# Patient Record
Sex: Female | Born: 1950
Health system: Southern US, Community
[De-identification: ages and names within clinical notes are randomized; demographics above are authoritative.]

## PROBLEM LIST (undated history)

## (undated) DIAGNOSIS — I10 Essential (primary) hypertension: Secondary | ICD-10-CM

## (undated) DIAGNOSIS — K921 Melena: Secondary | ICD-10-CM

## (undated) DIAGNOSIS — F419 Anxiety disorder, unspecified: Secondary | ICD-10-CM

## (undated) DIAGNOSIS — E785 Hyperlipidemia, unspecified: Secondary | ICD-10-CM

## (undated) DIAGNOSIS — M1990 Unspecified osteoarthritis, unspecified site: Secondary | ICD-10-CM

## (undated) DIAGNOSIS — K635 Polyp of colon: Secondary | ICD-10-CM

## (undated) DIAGNOSIS — E079 Disorder of thyroid, unspecified: Secondary | ICD-10-CM

## (undated) HISTORY — DX: Unspecified osteoarthritis, unspecified site: M19.90

## (undated) HISTORY — DX: Disorder of thyroid, unspecified: E07.9

## (undated) HISTORY — DX: Hyperlipidemia, unspecified: E78.5

## (undated) HISTORY — DX: Essential (primary) hypertension: I10

## (undated) HISTORY — DX: Melena: K92.1

## (undated) HISTORY — DX: Polyp of colon: K63.5

## (undated) HISTORY — PX: ABDOMINAL HYSTERECTOMY: SHX81

---

## 1998-01-09 ENCOUNTER — Emergency Department (HOSPITAL_COMMUNITY): Admission: EM | Admit: 1998-01-09 | Discharge: 1998-01-09 | Payer: Self-pay | Admitting: Emergency Medicine

## 1998-01-19 ENCOUNTER — Emergency Department (HOSPITAL_COMMUNITY): Admission: EM | Admit: 1998-01-19 | Discharge: 1998-01-19 | Payer: Self-pay | Admitting: Emergency Medicine

## 1998-10-06 ENCOUNTER — Ambulatory Visit (HOSPITAL_COMMUNITY): Admission: RE | Admit: 1998-10-06 | Discharge: 1998-10-06 | Payer: Self-pay | Admitting: *Deleted

## 1998-10-06 ENCOUNTER — Encounter (INDEPENDENT_AMBULATORY_CARE_PROVIDER_SITE_OTHER): Payer: Self-pay

## 1999-03-16 ENCOUNTER — Encounter: Admission: RE | Admit: 1999-03-16 | Discharge: 1999-03-16 | Payer: Self-pay | Admitting: Family Medicine

## 1999-03-16 ENCOUNTER — Encounter: Payer: Self-pay | Admitting: Family Medicine

## 1999-08-19 ENCOUNTER — Encounter: Payer: Self-pay | Admitting: Family Medicine

## 1999-08-19 ENCOUNTER — Encounter: Admission: RE | Admit: 1999-08-19 | Discharge: 1999-08-19 | Payer: Self-pay | Admitting: Family Medicine

## 1999-12-26 ENCOUNTER — Encounter: Payer: Self-pay | Admitting: Family Medicine

## 1999-12-26 ENCOUNTER — Encounter: Admission: RE | Admit: 1999-12-26 | Discharge: 1999-12-26 | Payer: Self-pay | Admitting: Family Medicine

## 2000-04-06 ENCOUNTER — Encounter: Admission: RE | Admit: 2000-04-06 | Discharge: 2000-04-06 | Payer: Self-pay | Admitting: Family Medicine

## 2000-04-06 ENCOUNTER — Encounter: Payer: Self-pay | Admitting: Family Medicine

## 2000-04-20 ENCOUNTER — Encounter (INDEPENDENT_AMBULATORY_CARE_PROVIDER_SITE_OTHER): Payer: Self-pay | Admitting: Specialist

## 2000-04-20 ENCOUNTER — Other Ambulatory Visit: Admission: RE | Admit: 2000-04-20 | Discharge: 2000-04-20 | Payer: Self-pay | Admitting: Family Medicine

## 2000-04-20 ENCOUNTER — Encounter: Admission: RE | Admit: 2000-04-20 | Discharge: 2000-04-20 | Payer: Self-pay | Admitting: Family Medicine

## 2000-04-20 ENCOUNTER — Encounter: Payer: Self-pay | Admitting: Family Medicine

## 2000-04-23 ENCOUNTER — Other Ambulatory Visit: Admission: RE | Admit: 2000-04-23 | Discharge: 2000-04-23 | Payer: Self-pay | Admitting: Gynecology

## 2001-01-04 ENCOUNTER — Encounter: Payer: Self-pay | Admitting: Family Medicine

## 2001-01-04 ENCOUNTER — Encounter: Admission: RE | Admit: 2001-01-04 | Discharge: 2001-01-04 | Payer: Self-pay | Admitting: Family Medicine

## 2001-04-12 ENCOUNTER — Encounter: Payer: Self-pay | Admitting: Family Medicine

## 2001-04-12 ENCOUNTER — Encounter: Admission: RE | Admit: 2001-04-12 | Discharge: 2001-04-12 | Payer: Self-pay | Admitting: Family Medicine

## 2001-04-30 ENCOUNTER — Other Ambulatory Visit: Admission: RE | Admit: 2001-04-30 | Discharge: 2001-04-30 | Payer: Self-pay | Admitting: Gynecology

## 2001-08-30 ENCOUNTER — Encounter: Payer: Self-pay | Admitting: Gastroenterology

## 2001-08-30 ENCOUNTER — Ambulatory Visit (HOSPITAL_COMMUNITY): Admission: RE | Admit: 2001-08-30 | Discharge: 2001-08-30 | Payer: Self-pay | Admitting: Gastroenterology

## 2001-09-11 ENCOUNTER — Encounter: Admission: RE | Admit: 2001-09-11 | Discharge: 2001-09-11 | Payer: Self-pay | Admitting: Family Medicine

## 2001-09-11 ENCOUNTER — Encounter: Payer: Self-pay | Admitting: Family Medicine

## 2001-10-09 ENCOUNTER — Ambulatory Visit (HOSPITAL_COMMUNITY): Admission: RE | Admit: 2001-10-09 | Discharge: 2001-10-09 | Payer: Self-pay | Admitting: Gastroenterology

## 2002-04-24 ENCOUNTER — Encounter: Payer: Self-pay | Admitting: Family Medicine

## 2002-04-24 ENCOUNTER — Encounter: Admission: RE | Admit: 2002-04-24 | Discharge: 2002-04-24 | Payer: Self-pay | Admitting: Family Medicine

## 2002-05-02 ENCOUNTER — Encounter: Admission: RE | Admit: 2002-05-02 | Discharge: 2002-05-02 | Payer: Self-pay | Admitting: Family Medicine

## 2002-05-02 ENCOUNTER — Encounter: Payer: Self-pay | Admitting: Family Medicine

## 2002-05-12 ENCOUNTER — Other Ambulatory Visit: Admission: RE | Admit: 2002-05-12 | Discharge: 2002-05-12 | Payer: Self-pay | Admitting: Gynecology

## 2002-10-28 ENCOUNTER — Encounter: Payer: Self-pay | Admitting: Family Medicine

## 2002-10-28 ENCOUNTER — Encounter: Admission: RE | Admit: 2002-10-28 | Discharge: 2002-10-28 | Payer: Self-pay | Admitting: Family Medicine

## 2003-04-10 ENCOUNTER — Encounter: Admission: RE | Admit: 2003-04-10 | Discharge: 2003-04-10 | Payer: Self-pay | Admitting: Family Medicine

## 2003-12-29 ENCOUNTER — Encounter: Admission: RE | Admit: 2003-12-29 | Discharge: 2003-12-29 | Payer: Self-pay | Admitting: *Deleted

## 2004-03-03 ENCOUNTER — Other Ambulatory Visit: Admission: RE | Admit: 2004-03-03 | Discharge: 2004-03-03 | Payer: Self-pay | Admitting: Family Medicine

## 2004-04-13 ENCOUNTER — Encounter: Admission: RE | Admit: 2004-04-13 | Discharge: 2004-04-13 | Payer: Self-pay | Admitting: Family Medicine

## 2005-03-10 ENCOUNTER — Encounter: Admission: RE | Admit: 2005-03-10 | Discharge: 2005-03-10 | Payer: Self-pay | Admitting: Family Medicine

## 2005-04-28 ENCOUNTER — Encounter: Admission: RE | Admit: 2005-04-28 | Discharge: 2005-04-28 | Payer: Self-pay | Admitting: Family Medicine

## 2005-05-11 ENCOUNTER — Encounter: Admission: RE | Admit: 2005-05-11 | Discharge: 2005-05-11 | Payer: Self-pay | Admitting: Family Medicine

## 2005-11-17 ENCOUNTER — Encounter: Admission: RE | Admit: 2005-11-17 | Discharge: 2005-11-17 | Payer: Self-pay | Admitting: Family Medicine

## 2006-05-21 ENCOUNTER — Encounter: Admission: RE | Admit: 2006-05-21 | Discharge: 2006-05-21 | Payer: Self-pay | Admitting: Family Medicine

## 2006-11-21 ENCOUNTER — Encounter: Admission: RE | Admit: 2006-11-21 | Discharge: 2006-11-21 | Payer: Self-pay | Admitting: Family Medicine

## 2007-05-22 ENCOUNTER — Encounter: Admission: RE | Admit: 2007-05-22 | Discharge: 2007-05-22 | Payer: Self-pay | Admitting: Family Medicine

## 2008-05-22 ENCOUNTER — Encounter: Admission: RE | Admit: 2008-05-22 | Discharge: 2008-05-22 | Payer: Self-pay | Admitting: Family Medicine

## 2009-06-02 ENCOUNTER — Encounter: Admission: RE | Admit: 2009-06-02 | Discharge: 2009-06-02 | Payer: Self-pay | Admitting: Family Medicine

## 2010-04-03 ENCOUNTER — Encounter: Payer: Self-pay | Admitting: Family Medicine

## 2010-05-12 ENCOUNTER — Other Ambulatory Visit: Payer: Self-pay | Admitting: Family Medicine

## 2010-05-12 DIAGNOSIS — Z1231 Encounter for screening mammogram for malignant neoplasm of breast: Secondary | ICD-10-CM

## 2010-06-08 ENCOUNTER — Ambulatory Visit
Admission: RE | Admit: 2010-06-08 | Discharge: 2010-06-08 | Disposition: A | Discharge status: Home / Self Care | Payer: BC Managed Care – PPO | Source: Ambulatory Visit | Attending: Family Medicine | Admitting: Family Medicine

## 2010-06-08 DIAGNOSIS — Z1231 Encounter for screening mammogram for malignant neoplasm of breast: Secondary | ICD-10-CM

## 2010-06-10 ENCOUNTER — Other Ambulatory Visit: Payer: Self-pay | Admitting: Family Medicine

## 2010-06-10 DIAGNOSIS — R928 Other abnormal and inconclusive findings on diagnostic imaging of breast: Secondary | ICD-10-CM

## 2010-06-15 ENCOUNTER — Ambulatory Visit
Admission: RE | Admit: 2010-06-15 | Discharge: 2010-06-15 | Disposition: A | Discharge status: Home / Self Care | Payer: BC Managed Care – PPO | Source: Ambulatory Visit | Attending: Family Medicine | Admitting: Family Medicine

## 2010-06-15 DIAGNOSIS — R928 Other abnormal and inconclusive findings on diagnostic imaging of breast: Secondary | ICD-10-CM

## 2010-07-05 DIAGNOSIS — F339 Major depressive disorder, recurrent, unspecified: Secondary | ICD-10-CM | POA: Insufficient documentation

## 2010-07-05 DIAGNOSIS — G4700 Insomnia, unspecified: Secondary | ICD-10-CM | POA: Diagnosis present

## 2010-07-05 DIAGNOSIS — M1990 Unspecified osteoarthritis, unspecified site: Secondary | ICD-10-CM | POA: Insufficient documentation

## 2010-07-05 DIAGNOSIS — A6000 Herpesviral infection of urogenital system, unspecified: Secondary | ICD-10-CM | POA: Insufficient documentation

## 2010-07-05 DIAGNOSIS — I10 Essential (primary) hypertension: Secondary | ICD-10-CM | POA: Insufficient documentation

## 2010-07-05 DIAGNOSIS — E785 Hyperlipidemia, unspecified: Secondary | ICD-10-CM | POA: Insufficient documentation

## 2010-07-05 DIAGNOSIS — R7301 Impaired fasting glucose: Secondary | ICD-10-CM | POA: Insufficient documentation

## 2010-07-05 DIAGNOSIS — D1809 Hemangioma of other sites: Secondary | ICD-10-CM | POA: Insufficient documentation

## 2010-07-05 DIAGNOSIS — E039 Hypothyroidism, unspecified: Secondary | ICD-10-CM | POA: Insufficient documentation

## 2010-07-05 DIAGNOSIS — H269 Unspecified cataract: Secondary | ICD-10-CM | POA: Insufficient documentation

## 2010-07-05 DIAGNOSIS — H02409 Unspecified ptosis of unspecified eyelid: Secondary | ICD-10-CM | POA: Insufficient documentation

## 2010-07-05 DIAGNOSIS — F411 Generalized anxiety disorder: Secondary | ICD-10-CM | POA: Diagnosis present

## 2010-07-29 NOTE — Op Note (Signed)
   NAME:  [name], [name]                            ACCOUNT NO.:  [id number]   MEDICAL RECORD NO.:  [id number]                   PATIENT TYPE:  AMB   LOCATION:  ENDO                                 FACILITY:  [facility]   PHYSICIAN:  Charna Elizabeth, M.D.                   DATE OF BIRTH:  1951/01/07   DATE OF PROCEDURE:  10/09/2001  DATE OF DISCHARGE:  10/09/2001                                 OPERATIVE REPORT   PROCEDURE PERFORMED:  Screening colonoscopy endoscopy.   ENDOSCOPIST:  Charna Elizabeth, M.D.   INSTRUMENT USED:  Olympus video colonoscope.   INDICATIONS FOR PROCEDURE:  This 60 year old female undergoing screening  colonoscopy.  The patient has had some rectal bleeding.  Rule out colonic  polyps, masses, hemorrhoids, and fissure.   PREPROCEDURE PREPARATION:  Informed consent was procured from the patient.  The patient fasted for eight hours prior to the procedure and prepped with a  bottle of magnesium citrate, one gallon of NuLytely the night prior to the  procedure.   PREPROCEDURE PHYSICAL:  VITAL SIGNS:  The patient has stable vital signs.   NECK:  Supple.   CHEST:  Clear to auscultation.   CARDIAC:  S1, S2 is regular.   ABDOMEN:  Soft with normal bowel sounds.   DESCRIPTION OF PROCEDURE:  The patient was placed in the left lateral  decubitus position and sedated with 77 mg of Demerol and 7 mg of Versed  intravenously.  Once the patient was adequately sedated and maintained on  low-flow oxygen and continuous cardiac monitoring, the Olympus video  colonoscope was advanced from the rectum to the cecum without difficulty.  The terminal ileum was visualized and appeared normal as well.  The  appendiceal orifice and ileocecal valve were photographed.  No masses,  polyps, erosions, ulcerations, diverticula were present.  Small nonbleeding  internal hemorrhoids were appreciated on retroflexion of the rectum.   IMPRESSION:  Normal colonoscopy up to the terminal ileum except for  small,  nonbleeding internal hemorrhoids.   RECOMMENDATIONS:  1. A high-fiber diet has been discussed with the patient in great detail and     liberal fluid intake has been advocated.  2.     Repeat colorectal cancer screening is recommended in the next 10 years     unless the patient develops any abnormal symptoms of in the interim.  3. Outpatient follow up on a p.r.n. basis.                                                   Charna Elizabeth, M.D.    JM/MEDQ  D:  10/09/2001  T:  10/12/2001  Job:  [id number]   cc:   Talmadge Coventry, M.D.

## 2010-08-24 ENCOUNTER — Other Ambulatory Visit: Payer: Self-pay | Admitting: Family Medicine

## 2010-08-24 DIAGNOSIS — L309 Dermatitis, unspecified: Secondary | ICD-10-CM | POA: Insufficient documentation

## 2010-08-24 DIAGNOSIS — J309 Allergic rhinitis, unspecified: Secondary | ICD-10-CM | POA: Insufficient documentation

## 2010-08-24 DIAGNOSIS — Z780 Asymptomatic menopausal state: Secondary | ICD-10-CM

## 2010-08-31 ENCOUNTER — Ambulatory Visit
Admission: RE | Admit: 2010-08-31 | Discharge: 2010-08-31 | Disposition: A | Discharge status: Home / Self Care | Payer: BC Managed Care – PPO | Source: Ambulatory Visit | Attending: Family Medicine | Admitting: Family Medicine

## 2010-08-31 DIAGNOSIS — Z780 Asymptomatic menopausal state: Secondary | ICD-10-CM

## 2011-05-23 ENCOUNTER — Other Ambulatory Visit: Payer: Self-pay | Admitting: Family Medicine

## 2011-05-23 DIAGNOSIS — Z1231 Encounter for screening mammogram for malignant neoplasm of breast: Secondary | ICD-10-CM

## 2011-06-16 ENCOUNTER — Ambulatory Visit
Admission: RE | Admit: 2011-06-16 | Discharge: 2011-06-16 | Disposition: A | Discharge status: Home / Self Care | Payer: BC Managed Care – PPO | Source: Ambulatory Visit | Attending: Family Medicine | Admitting: Family Medicine

## 2011-06-16 DIAGNOSIS — Z1231 Encounter for screening mammogram for malignant neoplasm of breast: Secondary | ICD-10-CM

## 2012-05-27 ENCOUNTER — Other Ambulatory Visit: Payer: Self-pay

## 2012-05-27 DIAGNOSIS — Z1231 Encounter for screening mammogram for malignant neoplasm of breast: Secondary | ICD-10-CM

## 2012-07-02 ENCOUNTER — Ambulatory Visit
Admission: RE | Admit: 2012-07-02 | Discharge: 2012-07-02 | Disposition: A | Discharge status: Home / Self Care | Source: Ambulatory Visit

## 2012-07-02 DIAGNOSIS — Z1231 Encounter for screening mammogram for malignant neoplasm of breast: Secondary | ICD-10-CM

## 2013-06-20 ENCOUNTER — Other Ambulatory Visit: Payer: Self-pay

## 2013-06-20 DIAGNOSIS — Z1231 Encounter for screening mammogram for malignant neoplasm of breast: Secondary | ICD-10-CM

## 2013-07-09 ENCOUNTER — Encounter (INDEPENDENT_AMBULATORY_CARE_PROVIDER_SITE_OTHER): Payer: Self-pay

## 2013-07-09 ENCOUNTER — Ambulatory Visit
Admission: RE | Admit: 2013-07-09 | Discharge: 2013-07-09 | Disposition: A | Discharge status: Home / Self Care | Payer: BC Managed Care – PPO | Source: Ambulatory Visit

## 2013-07-09 DIAGNOSIS — Z1231 Encounter for screening mammogram for malignant neoplasm of breast: Secondary | ICD-10-CM

## 2014-06-25 ENCOUNTER — Other Ambulatory Visit: Payer: Self-pay

## 2014-06-25 DIAGNOSIS — Z1231 Encounter for screening mammogram for malignant neoplasm of breast: Secondary | ICD-10-CM

## 2014-07-16 ENCOUNTER — Ambulatory Visit: Payer: BC Managed Care – PPO

## 2014-07-27 ENCOUNTER — Ambulatory Visit
Admission: RE | Admit: 2014-07-27 | Discharge: 2014-07-27 | Disposition: A | Discharge status: Home / Self Care | Payer: BC Managed Care – PPO | Source: Ambulatory Visit

## 2014-07-27 DIAGNOSIS — Z1231 Encounter for screening mammogram for malignant neoplasm of breast: Secondary | ICD-10-CM

## 2014-10-30 ENCOUNTER — Other Ambulatory Visit (INDEPENDENT_AMBULATORY_CARE_PROVIDER_SITE_OTHER): Payer: BC Managed Care – PPO

## 2014-10-30 ENCOUNTER — Encounter: Payer: Self-pay | Admitting: Internal Medicine

## 2014-10-30 ENCOUNTER — Ambulatory Visit (INDEPENDENT_AMBULATORY_CARE_PROVIDER_SITE_OTHER): Payer: BC Managed Care – PPO | Admitting: Internal Medicine

## 2014-10-30 VITALS — BP 110/60 | HR 70 | Temp 98.4°F | Resp 16 | Ht 67.0 in | Wt 181.0 lb

## 2014-10-30 DIAGNOSIS — H60392 Other infective otitis externa, left ear: Secondary | ICD-10-CM

## 2014-10-30 DIAGNOSIS — E039 Hypothyroidism, unspecified: Secondary | ICD-10-CM

## 2014-10-30 DIAGNOSIS — Z0000 Encounter for general adult medical examination without abnormal findings: Secondary | ICD-10-CM | POA: Diagnosis not present

## 2014-10-30 DIAGNOSIS — I10 Essential (primary) hypertension: Secondary | ICD-10-CM

## 2014-10-30 DIAGNOSIS — D1720 Benign lipomatous neoplasm of skin and subcutaneous tissue of unspecified limb: Secondary | ICD-10-CM

## 2014-10-30 DIAGNOSIS — F3340 Major depressive disorder, recurrent, in remission, unspecified: Secondary | ICD-10-CM

## 2014-10-30 DIAGNOSIS — D179 Benign lipomatous neoplasm, unspecified: Secondary | ICD-10-CM

## 2014-10-30 DIAGNOSIS — E785 Hyperlipidemia, unspecified: Secondary | ICD-10-CM

## 2014-10-30 LAB — COMPREHENSIVE METABOLIC PANEL
ALT: 9 U/L (ref 0–35)
AST: 15 U/L (ref 0–37)
Albumin: 4 g/dL (ref 3.5–5.2)
Alkaline Phosphatase: 63 U/L (ref 39–117)
BUN: 19 mg/dL (ref 6–23)
CO2: 28 mEq/L (ref 19–32)
Calcium: 9.3 mg/dL (ref 8.4–10.5)
Chloride: 106 mEq/L (ref 96–112)
Creatinine, Ser: 0.74 mg/dL (ref 0.40–1.20)
GFR: 101.43 mL/min (ref >60.00)
Glucose, Bld: 99 mg/dL (ref 70–99)
Potassium: 4.1 mEq/L (ref 3.5–5.1)
Sodium: 140 mEq/L (ref 135–145)
Total Bilirubin: 0.4 mg/dL (ref 0.2–1.2)
Total Protein: 7.2 g/dL (ref 6.0–8.3)

## 2014-10-30 LAB — HEMOGLOBIN A1C: Hgb A1c MFr Bld: 5.6 % (ref 4.6–6.5)

## 2014-10-30 LAB — LIPID PANEL
Cholesterol: 175 mg/dL (ref 0–200)
HDL: 59.8 mg/dL (ref >39.00)
LDL Cholesterol: 108 mg/dL — ABNORMAL HIGH (ref 0–99)
NonHDL: 114.91
Total CHOL/HDL Ratio: 3
Triglycerides: 36 mg/dL (ref 0.0–149.0)
VLDL: 7.2 mg/dL (ref 0.0–40.0)

## 2014-10-30 LAB — TSH: TSH: 1.08 u[IU]/mL (ref 0.35–4.50)

## 2014-10-30 LAB — CBC
HCT: 40 % (ref 36.0–46.0)
Hemoglobin: 13.9 g/dL (ref 12.0–15.0)
MCHC: 34.6 g/dL (ref 30.0–36.0)
MCV: 88.3 fl (ref 78.0–100.0)
Platelets: 227 10*3/uL (ref 150.0–400.0)
RBC: 4.53 Mil/uL (ref 3.87–5.11)
RDW: 14.1 % (ref 11.5–15.5)
WBC: 4.9 10*3/uL (ref 4.0–10.5)

## 2014-10-30 LAB — T4, FREE: Free T4: 0.96 ng/dL (ref 0.60–1.60)

## 2014-10-30 MED ORDER — NEOMYCIN-POLYMYXIN-HC 1 % OT SOLN: 3.0000 [drp] | Freq: Three times a day (TID) | OTIC | Status: DC

## 2014-10-30 NOTE — Progress Notes (Signed)
Pre visit review using our clinic review tool, if applicable. No additional management support is needed unless otherwise documented below in the visit note. 

## 2014-10-30 NOTE — Patient Instructions (Signed)
We will send you to the surgeon to have that spot on your arm off. It is called a lipoma and is not cancerous.  We are also checking your labs today for the physical. Come back in about 6 months. If you have any problems or questions please feel free to call us sooner.  It was a pleasure to meet you today.   Health Maintenance Adopting a healthy lifestyle and getting preventive care can go a long way to promote health and wellness. Talk with your health care provider about what schedule of regular examinations is right for you. This is a good chance for you to check in with your provider about disease prevention and staying healthy. In between checkups, there are plenty of things you can do on your own. Experts have done a lot of research about which lifestyle changes and preventive measures are most likely to keep you healthy. Ask your health care provider for more information. WEIGHT AND DIET  Eat a healthy diet  Be sure to include plenty of vegetables, fruits, low-fat dairy products, and lean protein.  Do not eat a lot of foods high in solid fats, added sugars, or salt.  Get regular exercise. This is one of the most important things you can do for your health.  Most adults should exercise for at least 150 minutes each week. The exercise should increase your heart rate and make you sweat (moderate-intensity exercise).  Most adults should also do strengthening exercises at least twice a week. This is in addition to the moderate-intensity exercise.  Maintain a healthy weight  Body mass index (BMI) is a measurement that can be used to identify possible weight problems. It estimates body fat based on height and weight. Your health care provider can help determine your BMI and help you achieve or maintain a healthy weight.  For females 20 years of age and older:   A BMI below 18.5 is considered underweight.  A BMI of 18.5 to 24.9 is normal.  A BMI of 25 to 29.9 is considered  overweight.  A BMI of 30 and above is considered obese.  Watch levels of cholesterol and blood lipids  You should start having your blood tested for lipids and cholesterol at 64 years of age, then have this test every 5 years.  You may need to have your cholesterol levels checked more often if:  Your lipid or cholesterol levels are high.  You are older than 64 years of age.  You are at high risk for heart disease.  CANCER SCREENING   Lung Cancer  Lung cancer screening is recommended for adults 55-80 years old who are at high risk for lung cancer because of a history of smoking.  A yearly low-dose CT scan of the lungs is recommended for people who:  Currently smoke.  Have quit within the past 15 years.  Have at least a 30-pack-year history of smoking. A pack year is smoking an average of one pack of cigarettes a day for 1 year.  Yearly screening should continue until it has been 15 years since you quit.  Yearly screening should stop if you develop a health problem that would prevent you from having lung cancer treatment.  Breast Cancer  Practice breast self-awareness. This means understanding how your breasts normally appear and feel.  It also means doing regular breast self-exams. Let your health care provider know about any changes, no matter how small.  If you are in your 20s or 30s, you   should have a clinical breast exam (CBE) by a health care provider every 1-3 years as part of a regular health exam.  If you are 1 or older, have a CBE every year. Also consider having a breast X-ray (mammogram) every year.  If you have a family history of breast cancer, talk to your health care provider about genetic screening.  If you are at high risk for breast cancer, talk to your health care provider about having an MRI and a mammogram every year.  Breast cancer gene (BRCA) assessment is recommended for women who have family members with BRCA-related cancers. BRCA-related  cancers include:  Breast.  Ovarian.  Tubal.  Peritoneal cancers.  Results of the assessment will determine the need for genetic counseling and BRCA1 and BRCA2 testing. Cervical Cancer Routine pelvic examinations to screen for cervical cancer are no longer recommended for nonpregnant women who are considered low risk for cancer of the pelvic organs (ovaries, uterus, and vagina) and who do not have symptoms. A pelvic examination may be necessary if you have symptoms including those associated with pelvic infections. Ask your health care provider if a screening pelvic exam is right for you.   The Pap test is the screening test for cervical cancer for women who are considered at risk.  If you had a hysterectomy for a problem that was not cancer or a condition that could lead to cancer, then you no longer need Pap tests.  If you are older than 65 years, and you have had normal Pap tests for the past 10 years, you no longer need to have Pap tests.  If you have had past treatment for cervical cancer or a condition that could lead to cancer, you need Pap tests and screening for cancer for at least 20 years after your treatment.  If you no longer get a Pap test, assess your risk factors if they change (such as having a new sexual partner). This can affect whether you should start being screened again.  Some women have medical problems that increase their chance of getting cervical cancer. If this is the case for you, your health care provider may recommend more frequent screening and Pap tests.  The human papillomavirus (HPV) test is another test that may be used for cervical cancer screening. The HPV test looks for the virus that can cause cell changes in the cervix. The cells collected during the Pap test can be tested for HPV.  The HPV test can be used to screen women 78 years of age and older. Getting tested for HPV can extend the interval between normal Pap tests from three to five  years.  An HPV test also should be used to screen women of any age who have unclear Pap test results.  After 64 years of age, women should have HPV testing as often as Pap tests.  Colorectal Cancer  This type of cancer can be detected and often prevented.  Routine colorectal cancer screening usually begins at 64 years of age and continues through 64 years of age.  Your health care provider may recommend screening at an earlier age if you have risk factors for colon cancer.  Your health care provider may also recommend using home test kits to check for hidden blood in the stool.  A small camera at the end of a tube can be used to examine your colon directly (sigmoidoscopy or colonoscopy). This is done to check for the earliest forms of colorectal cancer.  Routine screening  usually begins at age 55.  Direct examination of the colon should be repeated every 5-10 years through 64 years of age. However, you may need to be screened more often if early forms of precancerous polyps or small growths are found. Skin Cancer  Check your skin from head to toe regularly.  Tell your health care provider about any new moles or changes in moles, especially if there is a change in a mole's shape or color.  Also tell your health care provider if you have a mole that is larger than the size of a pencil eraser.  Always use sunscreen. Apply sunscreen liberally and repeatedly throughout the day.  Protect yourself by wearing long sleeves, pants, a wide-brimmed hat, and sunglasses whenever you are outside. HEART DISEASE, DIABETES, AND HIGH BLOOD PRESSURE   Have your blood pressure checked at least every 1-2 years. High blood pressure causes heart disease and increases the risk of stroke.  If you are between 66 years and 12 years old, ask your health care provider if you should take aspirin to prevent strokes.  Have regular diabetes screenings. This involves taking a blood sample to check your fasting  blood sugar level.  If you are at a normal weight and have a low risk for diabetes, have this test once every three years after 65 years of age.  If you are overweight and have a high risk for diabetes, consider being tested at a younger age or more often. PREVENTING INFECTION  Hepatitis B  If you have a higher risk for hepatitis B, you should be screened for this virus. You are considered at high risk for hepatitis B if:  You were born in a country where hepatitis B is common. Ask your health care provider which countries are considered high risk.  Your parents were born in a high-risk country, and you have not been immunized against hepatitis B (hepatitis B vaccine).  You have HIV or AIDS.  You use needles to inject street drugs.  You live with someone who has hepatitis B.  You have had sex with someone who has hepatitis B.  You get hemodialysis treatment.  You take certain medicines for conditions, including cancer, organ transplantation, and autoimmune conditions. Hepatitis C  Blood testing is recommended for:  Everyone born from [birth year] through 1965.  Anyone with known risk factors for hepatitis C. Sexually transmitted infections (STIs)  You should be screened for sexually transmitted infections (STIs) including gonorrhea and chlamydia if:  You are sexually active and are younger than 64 years of age.  You are older than 64 years of age and your health care provider tells you that you are at risk for this type of infection.  Your sexual activity has changed since you were last screened and you are at an increased risk for chlamydia or gonorrhea. Ask your health care provider if you are at risk.  If you do not have HIV, but are at risk, it may be recommended that you take a prescription medicine daily to prevent HIV infection. This is called pre-exposure prophylaxis (PrEP). You are considered at risk if:  You are sexually active and do not regularly use condoms or know  the HIV status of your partner(s).  You take drugs by injection.  You are sexually active with a partner who has HIV. Talk with your health care provider about whether you are at high risk of being infected with HIV. If you choose to begin PrEP, you should first be tested  for HIV. You should then be tested every 3 months for as long as you are taking PrEP.  PREGNANCY   If you are premenopausal and you may become pregnant, ask your health care provider about preconception counseling.  If you may become pregnant, take 400 to 800 micrograms (mcg) of folic acid every day.  If you want to prevent pregnancy, talk to your health care provider about birth control (contraception). OSTEOPOROSIS AND MENOPAUSE   Osteoporosis is a disease in which the bones lose minerals and strength with aging. This can result in serious bone fractures. Your risk for osteoporosis can be identified using a bone density scan.  If you are 65 years of age or older, or if you are at risk for osteoporosis and fractures, ask your health care provider if you should be screened.  Ask your health care provider whether you should take a calcium or vitamin D supplement to lower your risk for osteoporosis.  Menopause may have certain physical symptoms and risks.  Hormone replacement therapy may reduce some of these symptoms and risks. Talk to your health care provider about whether hormone replacement therapy is right for you.  HOME CARE INSTRUCTIONS   Schedule regular health, dental, and eye exams.  Stay current with your immunizations.   Do not use any tobacco products including cigarettes, chewing tobacco, or electronic cigarettes.  If you are pregnant, do not drink alcohol.  If you are breastfeeding, limit how much and how often you drink alcohol.  Limit alcohol intake to no more than 1 drink per day for nonpregnant women. One drink equals 12 ounces of beer, 5 ounces of wine, or 1 ounces of hard liquor.  Do not  use street drugs.  Do not share needles.  Ask your health care provider for help if you need support or information about quitting drugs.  Tell your health care provider if you often feel depressed.  Tell your health care provider if you have ever been abused or do not feel safe at home. Document Released: 09/12/2010 Document Revised: 07/14/2013 Document Reviewed: 01/29/2013 Exit[employer] Patient Information 2015 [employer]. This information is not intended to replace advice given to you by your health care provider. Make sure you discuss any questions you have with your health care provider.  

## 2014-10-31 ENCOUNTER — Encounter: Payer: Self-pay | Admitting: Internal Medicine

## 2014-10-31 DIAGNOSIS — H60399 Other infective otitis externa, unspecified ear: Secondary | ICD-10-CM | POA: Insufficient documentation

## 2014-10-31 DIAGNOSIS — D1720 Benign lipomatous neoplasm of skin and subcutaneous tissue of unspecified limb: Secondary | ICD-10-CM | POA: Insufficient documentation

## 2014-10-31 DIAGNOSIS — E785 Hyperlipidemia, unspecified: Secondary | ICD-10-CM | POA: Insufficient documentation

## 2014-10-31 DIAGNOSIS — E039 Hypothyroidism, unspecified: Secondary | ICD-10-CM | POA: Insufficient documentation

## 2014-10-31 DIAGNOSIS — I10 Essential (primary) hypertension: Secondary | ICD-10-CM | POA: Insufficient documentation

## 2014-10-31 DIAGNOSIS — F3340 Major depressive disorder, recurrent, in remission, unspecified: Secondary | ICD-10-CM | POA: Insufficient documentation

## 2014-10-31 NOTE — Assessment & Plan Note (Signed)
Checking BMP and adjust therapy as needed. BP controlled on her current losartan/hctz 50/12.5 mg and room to increase in the future if needed.

## 2014-10-31 NOTE — Assessment & Plan Note (Signed)
Acute problem, rx for corticosporin ear drops for resolution.

## 2014-10-31 NOTE — Assessment & Plan Note (Signed)
Currently taking synthroid 100 mcg daily, checking TSH and free T4 and adjust dosing as appropriate. No symptoms of over or under replacement.

## 2014-10-31 NOTE — Assessment & Plan Note (Signed)
Had episode of severe symptoms and doing well with cymbalta and trazodone at this time. Does not want to try going off therapy as she has failed going off this in the past.

## 2014-10-31 NOTE — Progress Notes (Signed)
   Subjective:    Patient ID: [name], female    DOB: 09/19/1950, 64 y.o.   MRN: [id number]  HPI The patient is a new 64 YO female who is coming in for several concerns. She has a growth on her left arm that is growing and she wants looked at. It does not hurt and has been present for many years. Now in the last year it is growing and starting to get attention and she does not like that. No weight change.  The other concern is that her left ear is hurting. She does some swimming and thinks that may have caused it. Hurts to move her ear. No fevers or chills, no discharge from her ears and no hearing change. Gong on about 1-2 weeks. Worsening.  PMH, [facility], social history reviewed and updated.  Review of Systems  Constitutional: Negative for fever, activity change, appetite change, fatigue and unexpected weight change.  HENT: Positive for ear pain. Negative for congestion, ear discharge, hearing loss, rhinorrhea, sinus pressure and sore throat.   Eyes: Negative.   Respiratory: Negative for cough, chest tightness, shortness of breath and wheezing.   Cardiovascular: Negative for chest pain, palpitations and leg swelling.  Gastrointestinal: Negative for nausea, abdominal pain, diarrhea, constipation and abdominal distention.  Musculoskeletal: Negative.   Skin: Negative for color change and wound.       Area of growth on left arm  Neurological: Negative.   Psychiatric/Behavioral: Negative.       Objective:   Physical Exam  Constitutional: She is oriented to person, place, and time. She appears well-developed and well-nourished.  Overweight  HENT:  Head: Normocephalic and atraumatic.  Left ear with otitis externa, TM normal bilaterally  Eyes: EOM are normal.  Neck: Normal range of motion.  Cardiovascular: Normal rate and regular rhythm.   No murmur heard. Pulmonary/Chest: Effort normal and breath sounds normal. No respiratory distress. She has no wheezes. She has no rales.  Abdominal:  Soft. Bowel sounds are normal. She exhibits no distension. There is no tenderness. There is no rebound.  Musculoskeletal: She exhibits no tenderness.  Neurological: She is alert and oriented to person, place, and time. Coordination normal.  Skin: Skin is warm and dry.  6 cm lipoma on the triceps region left arm posterior.  Psychiatric: She has a normal mood and affect.   Filed Vitals:   10/30/14 1105  BP: 110/60  Pulse: 70  Temp: 98.4 F (36.9 C)  TempSrc: Oral  Resp: 16  Height: 5\' 7"  (1.702 m)  Weight: 181 lb (82.101 kg)  SpO2: 97%      Assessment & Plan:

## 2014-10-31 NOTE — Assessment & Plan Note (Signed)
Checking lipid panel to evaluate. Currently taking simvastatin 20 mg daily without side effects. Checking LFTs as well.

## 2014-10-31 NOTE — Assessment & Plan Note (Signed)
Referral to general surgery for resection due to cosmetic reason (large and visible to public). Reassured her that this is not concerning for malignancy.

## 2014-11-09 ENCOUNTER — Telehealth: Payer: Self-pay | Admitting: Internal Medicine

## 2014-11-09 NOTE — Telephone Encounter (Signed)
Patient is calling for the result of her labs °

## 2014-11-10 NOTE — Telephone Encounter (Signed)
Left patient a message informing her that all her labs were normal.

## 2014-11-17 ENCOUNTER — Telehealth: Payer: Self-pay | Admitting: Internal Medicine

## 2014-11-17 NOTE — Telephone Encounter (Signed)
Received records from [facility] forwarded 22 pages to Dr. Vertell Novak 11/17/14 fbg.

## 2014-11-18 ENCOUNTER — Ambulatory Visit: Payer: Self-pay | Admitting: Surgery

## 2014-12-15 ENCOUNTER — Other Ambulatory Visit: Payer: Self-pay | Admitting: Geriatric Medicine

## 2014-12-15 ENCOUNTER — Telehealth: Payer: Self-pay | Admitting: Internal Medicine

## 2014-12-15 MED ORDER — SIMVASTATIN 20 MG PO TABS: 20.0000 mg | ORAL_TABLET | Freq: Every day | ORAL | Status: DC

## 2014-12-15 MED ORDER — TRAZODONE HCL 50 MG PO TABS: ORAL_TABLET | ORAL | Status: DC

## 2014-12-15 MED ORDER — LOSARTAN POTASSIUM-HCTZ 50-12.5 MG PO TABS: 1.0000 | ORAL_TABLET | Freq: Every day | ORAL | Status: DC

## 2014-12-15 MED ORDER — DULOXETINE HCL 60 MG PO CPEP: 60.0000 mg | ORAL_CAPSULE | Freq: Every day | ORAL | Status: DC

## 2014-12-15 NOTE — Telephone Encounter (Signed)
Pt called requesting refills for  DULoxetine (CYMBALTA) 60 MG capsule [[id number]]  losartan-hydrochlorothiazide (HYZAAR) 50-12.5 MG per tablet [[id number]]  simvastatin (ZOCOR) 20 MG tablet [[id number] traZODone (DESYREL) 50 MG tablet [[id number]  Pharmacy is [facility] on [address]

## 2014-12-15 NOTE — Telephone Encounter (Signed)
Sent to pharmacy 

## 2015-01-01 ENCOUNTER — Ambulatory Visit: Payer: Self-pay | Admitting: Surgery

## 2015-01-12 ENCOUNTER — Other Ambulatory Visit: Payer: Self-pay | Admitting: Internal Medicine

## 2015-03-17 ENCOUNTER — Telehealth: Payer: Self-pay | Admitting: *Deleted

## 2015-03-17 MED ORDER — SIMVASTATIN 20 MG PO TABS: 20.0000 mg | ORAL_TABLET | Freq: Every day | ORAL | Status: DC

## 2015-03-17 MED ORDER — DULOXETINE HCL 60 MG PO CPEP: 60.0000 mg | ORAL_CAPSULE | Freq: Every day | ORAL | Status: DC

## 2015-03-17 MED ORDER — TRAZODONE HCL 50 MG PO TABS: ORAL_TABLET | ORAL | Status: DC

## 2015-03-17 MED ORDER — LOSARTAN POTASSIUM-HCTZ 50-12.5 MG PO TABS: 1.0000 | ORAL_TABLET | Freq: Every day | ORAL | Status: DC

## 2015-03-17 NOTE — Telephone Encounter (Signed)
Received call pt states she is needing refills on her Cymbalta, Losartan,simvastatin and Trazodone. Needing rx's to go to [facility] instead of [facility] due to insurance change. Verified which [facility] inform sending electronically...Johny Chess

## 2015-05-03 ENCOUNTER — Ambulatory Visit (INDEPENDENT_AMBULATORY_CARE_PROVIDER_SITE_OTHER): Payer: Medicare Other | Admitting: Internal Medicine

## 2015-05-03 ENCOUNTER — Encounter: Payer: Self-pay | Admitting: Internal Medicine

## 2015-05-03 ENCOUNTER — Other Ambulatory Visit (INDEPENDENT_AMBULATORY_CARE_PROVIDER_SITE_OTHER): Payer: Medicare Other

## 2015-05-03 VITALS — BP 122/78 | HR 67 | Temp 98.3°F | Resp 14 | Ht 67.0 in | Wt 178.0 lb

## 2015-05-03 DIAGNOSIS — E039 Hypothyroidism, unspecified: Secondary | ICD-10-CM | POA: Diagnosis not present

## 2015-05-03 DIAGNOSIS — F3340 Major depressive disorder, recurrent, in remission, unspecified: Secondary | ICD-10-CM | POA: Diagnosis not present

## 2015-05-03 DIAGNOSIS — I10 Essential (primary) hypertension: Secondary | ICD-10-CM | POA: Diagnosis not present

## 2015-05-03 LAB — COMPREHENSIVE METABOLIC PANEL
ALT: 10 U/L (ref 0–35)
AST: 16 U/L (ref 0–37)
Albumin: 4.1 g/dL (ref 3.5–5.2)
Alkaline Phosphatase: 52 U/L (ref 39–117)
BUN: 17 mg/dL (ref 6–23)
CO2: 28 mEq/L (ref 19–32)
Calcium: 9.4 mg/dL (ref 8.4–10.5)
Chloride: 108 mEq/L (ref 96–112)
Creatinine, Ser: 0.77 mg/dL (ref 0.40–1.20)
GFR: 96.73 mL/min (ref >60.00)
Glucose, Bld: 97 mg/dL (ref 70–99)
Potassium: 3.8 mEq/L (ref 3.5–5.1)
Sodium: 143 mEq/L (ref 135–145)
Total Bilirubin: 0.5 mg/dL (ref 0.2–1.2)
Total Protein: 7 g/dL (ref 6.0–8.3)

## 2015-05-03 LAB — TSH: TSH: 1.97 u[IU]/mL (ref 0.35–4.50)

## 2015-05-03 LAB — T4, FREE: Free T4: 1 ng/dL (ref 0.60–1.60)

## 2015-05-03 MED ORDER — TRIAMCINOLONE ACETONIDE 0.1 % EX CREA: 1.0000 "application " | TOPICAL_CREAM | Freq: Two times a day (BID) | CUTANEOUS | Status: DC

## 2015-05-03 MED ORDER — NEOMYCIN-POLYMYXIN-HC 1 % OT SOLN: 3.0000 [drp] | Freq: Three times a day (TID) | OTIC | Status: DC

## 2015-05-03 MED ORDER — FLUTICASONE PROPIONATE 50 MCG/ACT NA SUSP: 2.0000 | Freq: Every day | NASAL | Status: DC

## 2015-05-03 NOTE — Progress Notes (Signed)
Pre visit review using our clinic review tool, if applicable. No additional management support is needed unless otherwise documented below in the visit note. 

## 2015-05-03 NOTE — Patient Instructions (Signed)
We will check the labs today and call you back with the results.   We have sent in a triamcinolone cream for the two spots that you can use twice daily.   We have sent in the refills of the nose spray and the ear drops.

## 2015-05-03 NOTE — Progress Notes (Signed)
   Subjective:    Patient ID: [name], female    DOB: [birth year], 65 y.o.   MRN: [id number]  HPI The patient is a 65 YO female coming in for follow up of her medical problems including her blood pressure (controlled without complications on losartan/hctz), her thyroid (synthroid 100 mcg daily without symptoms of over or under replacement), her depression (still doing well on cymbalta, keeping up with her relaxation and self time to help as well, no relapse since last visit). No new complaints or concerns except small rash on her foot. Itchy and she is scratching and it gets red when she scratches.   Review of Systems  Constitutional: Negative for fever, activity change, appetite change, fatigue and unexpected weight change.  HENT: Negative for congestion, ear discharge, hearing loss, rhinorrhea, sinus pressure and sore throat.   Eyes: Negative.   Respiratory: Negative for cough, chest tightness, shortness of breath and wheezing.   Cardiovascular: Negative for chest pain, palpitations and leg swelling.  Gastrointestinal: Negative for nausea, abdominal pain, diarrhea, constipation and abdominal distention.  Musculoskeletal: Negative.   Skin: Negative for color change and wound.  Neurological: Negative.   Psychiatric/Behavioral: Negative.       Objective:   Physical Exam  Constitutional: She is oriented to person, place, and time. She appears well-developed and well-nourished.  Overweight  HENT:  Head: Normocephalic and atraumatic.  Eyes: EOM are normal.  Neck: Normal range of motion.  Cardiovascular: Normal rate and regular rhythm.   No murmur heard. Pulmonary/Chest: Effort normal and breath sounds normal. No respiratory distress. She has no wheezes. She has no rales.  Abdominal: Soft. Bowel sounds are normal. She exhibits no distension. There is no tenderness. There is no rebound.  Musculoskeletal: She exhibits no tenderness.  Neurological: She is alert and oriented to person,  place, and time. Coordination normal.  Skin: Skin is warm and dry.  6 cm lipoma on the triceps region left arm posterior.  Psychiatric: She has a normal mood and affect.   Filed Vitals:   05/03/15 0930  BP: 122/78  Pulse: 67  Temp: 98.3 F (36.8 C)  TempSrc: Oral  Resp: 14  Height: 5\' 7"  (1.702 m)  Weight: 178 lb (80.74 kg)  SpO2: 98%      Assessment & Plan:

## 2015-05-04 NOTE — Assessment & Plan Note (Signed)
BP at goal on losartan/hctz. Checking CMP and adjust as needed. No side effects.

## 2015-05-04 NOTE — Assessment & Plan Note (Signed)
Checking TSH and free T4, taking synthroid 100 mcg daily and adjust as needed.

## 2015-05-04 NOTE — Assessment & Plan Note (Signed)
Still in remission and will continue cymbalta and trazodone for sleep.

## 2015-05-06 ENCOUNTER — Telehealth: Payer: Self-pay | Admitting: Internal Medicine

## 2015-05-06 NOTE — Telephone Encounter (Signed)
Pt called and informed pt of her labs

## 2015-05-20 ENCOUNTER — Ambulatory Visit: Payer: Medicare Other

## 2015-05-20 ENCOUNTER — Other Ambulatory Visit: Payer: Medicare Other

## 2015-05-20 VITALS — BP 120/80 | Ht 67.0 in | Wt 181.4 lb

## 2015-05-20 DIAGNOSIS — Z1159 Encounter for screening for other viral diseases: Secondary | ICD-10-CM

## 2015-05-20 DIAGNOSIS — Z0000 Encounter for general adult medical examination without abnormal findings: Secondary | ICD-10-CM

## 2015-05-20 NOTE — Patient Instructions (Addendum)
[name] , Thank you for taking time to come for your Medicare Wellness Visit. I appreciate your ongoing commitment to your health goals. Please review the following plan we discussed and let me know if I can assist you in the future.   Can go have hepatitis C drawn   Goal to continue to exercise and eat healthy These are the goals we discussed: Goals    None      This is a list of the screening recommended for you and due dates:  Health Maintenance  Topic Date Due  .  Hepatitis C: One time screening is recommended by Center for Disease Control  (CDC) for  adults born from [birth year] through 1965.   1950/10/09  . HIV Screening  03/30/1965  . Tetanus Vaccine  03/30/1969  . Pap Smear  03/31/1971  . Pneumonia vaccines (1 of 2 - PCV13) 03/31/2015  . Flu Shot  05/02/2016*  . Mammogram  07/26/2016  . Colon Cancer Screening  03/13/2018  . DEXA scan (bone density measurement)  Completed  . Shingles Vaccine  Addressed  *Topic was postponed. The date shown is not the original due date.     Fat and Cholesterol Restricted Diet Getting too much fat and cholesterol in your diet may cause health problems. Following this diet helps keep your fat and cholesterol at normal levels. This can keep you from getting sick. WHAT TYPES OF FAT SHOULD I CHOOSE?  Choose monosaturated and polyunsaturated fats. These are found in foods such as olive oil, canola oil, flaxseeds, walnuts, almonds, and seeds.  Eat more omega-3 fats. Good choices include salmon, mackerel, sardines, tuna, flaxseed oil, and ground flaxseeds.  Limit saturated fats. These are in animal products such as meats, butter, and cream. They can also be in plant products such as palm oil, palm kernel oil, and coconut oil.   Avoid foods with partially hydrogenated oils in them. These contain trans fats. Examples of foods that have trans fats are stick margarine, some tub margarines, cookies, crackers, and other baked goods. WHAT GENERAL  GUIDELINES DO I NEED TO FOLLOW?   Check food labels. Look for the words "trans fat" and "saturated fat."  When preparing a meal:  Fill half of your plate with vegetables and green salads.  Fill one fourth of your plate with whole grains. Look for the word "whole" as the first word in the ingredient list.  Fill one fourth of your plate with lean protein foods.  Limit fruit to two servings a day. Choose fruit instead of juice.  Eat more foods with soluble fiber. Examples of foods with this type of fiber are apples, broccoli, carrots, beans, peas, and barley. Try to get 20-30 g (grams) of fiber per day.  Eat more home-cooked foods. Eat less at restaurants and buffets.  Limit or avoid alcohol.  Limit foods high in starch and sugar.  Limit fried foods.  Cook foods without frying them. Baking, boiling, grilling, and broiling are all great options.  Lose weight if you are overweight. Losing even a small amount of weight can help your overall health. It can also help prevent diseases such as diabetes and heart disease. WHAT FOODS CAN I EAT? Grains Whole grains, such as whole wheat or whole grain breads, crackers, cereals, and pasta. Unsweetened oatmeal, bulgur, barley, quinoa, or brown rice. Corn or whole wheat flour tortillas. Vegetables Fresh or frozen vegetables (raw, steamed, roasted, or grilled). Green salads. Fruits All fresh, canned (in natural juice), or frozen fruits.  Meat and Other Protein Products Ground beef (85% or leaner), grass-fed beef, or beef trimmed of fat. Skinless chicken or [place]. Ground chicken or [place]. Pork trimmed of fat. All fish and seafood. Eggs. Dried beans, peas, or lentils. Unsalted nuts or seeds. Unsalted canned or dry beans. Dairy Low-fat dairy products, such as skim or 1% milk, 2% or reduced-fat cheeses, low-fat ricotta or cottage cheese, or plain low-fat yogurt. Fats and Oils Tub margarines without trans fats. Light or reduced-fat mayonnaise and  salad dressings. Avocado. Olive, canola, sesame, or safflower oils. Natural peanut or almond butter (choose ones without added sugar and oil). The items listed above may not be a complete list of recommended foods or beverages. Contact your dietitian for more options. WHAT FOODS ARE NOT RECOMMENDED? Grains White bread. White pasta. White rice. Cornbread. Bagels, pastries, and croissants. Crackers that contain trans fat. Vegetables White potatoes. Corn. Creamed or fried vegetables. Vegetables in a cheese sauce. Fruits Dried fruits. Canned fruit in light or heavy syrup. Fruit juice. Meat and Other Protein Products Fatty cuts of meat. Ribs, chicken wings, bacon, sausage, bologna, salami, chitterlings, fatback, hot dogs, bratwurst, and packaged luncheon meats. Liver and organ meats. Dairy Whole or 2% milk, cream, half-and-half, and cream cheese. Whole milk cheeses. Whole-fat or sweetened yogurt. Full-fat cheeses. Nondairy creamers and whipped toppings. Processed cheese, cheese spreads, or cheese curds. Sweets and Desserts Corn syrup, sugars, honey, and molasses. Candy. Jam and jelly. Syrup. Sweetened cereals. Cookies, pies, cakes, donuts, muffins, and ice cream. Fats and Oils Butter, stick margarine, lard, shortening, ghee, or bacon fat. Coconut, palm kernel, or palm oils. Beverages Alcohol. Sweetened drinks (such as sodas, lemonade, and fruit drinks or punches). The items listed above may not be a complete list of foods and beverages to avoid. Contact your dietitian for more information.   This information is not intended to replace advice given to you by your health care provider. Make sure you discuss any questions you have with your health care provider.   Document Released: 08/29/2011 Document Revised: 03/20/2014 Document Reviewed: 05/29/2013 Elsevier Interactive Patient Education 2016 [place] Maintenance, Female Adopting a healthy lifestyle and getting preventive care can  go a long way to promote health and wellness. Talk with your health care provider about what schedule of regular examinations is right for you. This is a good chance for you to check in with your provider about disease prevention and staying healthy. In between checkups, there are plenty of things you can do on your own. Experts have done a lot of research about which lifestyle changes and preventive measures are most likely to keep you healthy. Ask your health care provider for more information. WEIGHT AND DIET  Eat a healthy diet  Be sure to include plenty of vegetables, fruits, low-fat dairy products, and lean protein.  Do not eat a lot of foods high in solid fats, added sugars, or salt.  Get regular exercise. This is one of the most important things you can do for your health.  Most adults should exercise for at least 150 minutes each week. The exercise should increase your heart rate and make you sweat (moderate-intensity exercise).  Most adults should also do strengthening exercises at least twice a week. This is in addition to the moderate-intensity exercise.  Maintain a healthy weight  Body mass index (BMI) is a measurement that can be used to identify possible weight problems. It estimates body fat based on height and weight. Your health care provider can  help determine your BMI and help you achieve or maintain a healthy weight.  For females 65 years of age and older:   A BMI below 18.5 is considered underweight.  A BMI of 18.5 to 24.9 is normal.  A BMI of 25 to 29.9 is considered overweight.  A BMI of 30 and above is considered obese.  Watch levels of cholesterol and blood lipids  You should start having your blood tested for lipids and cholesterol at 65 years of age, then have this test every 5 years.  You may need to have your cholesterol levels checked more often if:  Your lipid or cholesterol levels are high.  You are older than 65 years of age.  You are at high  risk for heart disease.  CANCER SCREENING   Lung Cancer  Lung cancer screening is recommended for adults 74-41 years old who are at high risk for lung cancer because of a history of smoking.  A yearly low-dose CT scan of the lungs is recommended for people who:  Currently smoke.  Have quit within the past 15 years.  Have at least a 30-pack-year history of smoking. A pack year is smoking an average of one pack of cigarettes a day for 1 year.  Yearly screening should continue until it has been 15 years since you quit.  Yearly screening should stop if you develop a health problem that would prevent you from having lung cancer treatment.  Breast Cancer  Practice breast self-awareness. This means understanding how your breasts normally appear and feel.  It also means doing regular breast self-exams. Let your health care provider know about any changes, no matter how small.  If you are in your 20s or 30s, you should have a clinical breast exam (CBE) by a health care provider every 1-3 years as part of a regular health exam.  If you are 58 or older, have a CBE every year. Also consider having a breast X-ray (mammogram) every year.  If you have a family history of breast cancer, talk to your health care provider about genetic screening.  If you are at high risk for breast cancer, talk to your health care provider about having an MRI and a mammogram every year.  Breast cancer gene (BRCA) assessment is recommended for women who have family members with BRCA-related cancers. BRCA-related cancers include:  Breast.  Ovarian.  Tubal.  Peritoneal cancers.  Results of the assessment will determine the need for genetic counseling and BRCA1 and BRCA2 testing. Cervical Cancer Your health care provider may recommend that you be screened regularly for cancer of the pelvic organs (ovaries, uterus, and vagina). This screening involves a pelvic examination, including checking for microscopic  changes to the surface of your cervix (Pap test). You may be encouraged to have this screening done every 3 years, beginning at age 70.  For women ages 23-65, health care providers may recommend pelvic exams and Pap testing every 3 years, or they may recommend the Pap and pelvic exam, combined with testing for human papilloma virus (HPV), every 5 years. Some types of HPV increase your risk of cervical cancer. Testing for HPV may also be done on women of any age with unclear Pap test results.  Other health care providers may not recommend any screening for nonpregnant women who are considered low risk for pelvic cancer and who do not have symptoms. Ask your health care provider if a screening pelvic exam is right for you.  If you have had  past treatment for cervical cancer or a condition that could lead to cancer, you need Pap tests and screening for cancer for at least 20 years after your treatment. If Pap tests have been discontinued, your risk factors (such as having a new sexual partner) need to be reassessed to determine if screening should resume. Some women have medical problems that increase the chance of getting cervical cancer. In these cases, your health care provider may recommend more frequent screening and Pap tests. Colorectal Cancer  This type of cancer can be detected and often prevented.  Routine colorectal cancer screening usually begins at 65 years of age and continues through 65 years of age.  Your health care provider may recommend screening at an earlier age if you have risk factors for colon cancer.  Your health care provider may also recommend using home test kits to check for hidden blood in the stool.  A small camera at the end of a tube can be used to examine your colon directly (sigmoidoscopy or colonoscopy). This is done to check for the earliest forms of colorectal cancer.  Routine screening usually begins at age 52.  Direct examination of the colon should be  repeated every 5-10 years through 65 years of age. However, you may need to be screened more often if early forms of precancerous polyps or small growths are found. Skin Cancer  Check your skin from head to toe regularly.  Tell your health care provider about any new moles or changes in moles, especially if there is a change in a mole's shape or color.  Also tell your health care provider if you have a mole that is larger than the size of a pencil eraser.  Always use sunscreen. Apply sunscreen liberally and repeatedly throughout the day.  Protect yourself by wearing long sleeves, pants, a wide-brimmed hat, and sunglasses whenever you are outside. HEART DISEASE, DIABETES, AND HIGH BLOOD PRESSURE   High blood pressure causes heart disease and increases the risk of stroke. High blood pressure is more likely to develop in:  People who have blood pressure in the high end of the normal range (130-139/85-89 mm Hg).  People who are overweight or obese.  People who are African American.  If you are 39-7 years of age, have your blood pressure checked every 3-5 years. If you are 83 years of age or older, have your blood pressure checked every year. You should have your blood pressure measured twice--once when you are at a hospital or clinic, and once when you are not at a hospital or clinic. Record the average of the two measurements. To check your blood pressure when you are not at a hospital or clinic, you can use:  An automated blood pressure machine at a pharmacy.  A home blood pressure monitor.  If you are between 40 years and 6 years old, ask your health care provider if you should take aspirin to prevent strokes.  Have regular diabetes screenings. This involves taking a blood sample to check your fasting blood sugar level.  If you are at a normal weight and have a low risk for diabetes, have this test once every three years after 65 years of age.  If you are overweight and have a high  risk for diabetes, consider being tested at a younger age or more often. PREVENTING INFECTION  Hepatitis B  If you have a higher risk for hepatitis B, you should be screened for this virus. You are considered at high risk  for hepatitis B if:  You were born in a country where hepatitis B is common. Ask your health care provider which countries are considered high risk.  Your parents were born in a high-risk country, and you have not been immunized against hepatitis B (hepatitis B vaccine).  You have HIV or AIDS.  You use needles to inject street drugs.  You live with someone who has hepatitis B.  You have had sex with someone who has hepatitis B.  You get hemodialysis treatment.  You take certain medicines for conditions, including cancer, organ transplantation, and autoimmune conditions. Hepatitis C  Blood testing is recommended for:  Everyone born from [birth year] through 1965.  Anyone with known risk factors for hepatitis C. Sexually transmitted infections (STIs)  You should be screened for sexually transmitted infections (STIs) including gonorrhea and chlamydia if:  You are sexually active and are younger than 65 years of age.  You are older than 65 years of age and your health care provider tells you that you are at risk for this type of infection.  Your sexual activity has changed since you were last screened and you are at an increased risk for chlamydia or gonorrhea. Ask your health care provider if you are at risk.  If you do not have HIV, but are at risk, it may be recommended that you take a prescription medicine daily to prevent HIV infection. This is called pre-exposure prophylaxis (PrEP). You are considered at risk if:  You are sexually active and do not regularly use condoms or know the HIV status of your partner(s).  You take drugs by injection.  You are sexually active with a partner who has HIV. Talk with your health care provider about whether you are at high  risk of being infected with HIV. If you choose to begin PrEP, you should first be tested for HIV. You should then be tested every 3 months for as long as you are taking PrEP.  PREGNANCY   If you are premenopausal and you may become pregnant, ask your health care provider about preconception counseling.  If you may become pregnant, take 400 to 800 micrograms (mcg) of folic acid every day.  If you want to prevent pregnancy, talk to your health care provider about birth control (contraception). OSTEOPOROSIS AND MENOPAUSE   Osteoporosis is a disease in which the bones lose minerals and strength with aging. This can result in serious bone fractures. Your risk for osteoporosis can be identified using a bone density scan.  If you are 51 years of age or older, or if you are at risk for osteoporosis and fractures, ask your health care provider if you should be screened.  Ask your health care provider whether you should take a calcium or vitamin D supplement to lower your risk for osteoporosis.  Menopause may have certain physical symptoms and risks.  Hormone replacement therapy may reduce some of these symptoms and risks. Talk to your health care provider about whether hormone replacement therapy is right for you.  HOME CARE INSTRUCTIONS   Schedule regular health, dental, and eye exams.  Stay current with your immunizations.   Do not use any tobacco products including cigarettes, chewing tobacco, or electronic cigarettes.  If you are pregnant, do not drink alcohol.  If you are breastfeeding, limit how much and how often you drink alcohol.  Limit alcohol intake to no more than 1 drink per day for nonpregnant women. One drink equals 12 ounces of beer, 5 ounces of  wine, or 1 ounces of hard liquor.  Do not use street drugs.  Do not share needles.  Ask your health care provider for help if you need support or information about quitting drugs.  Tell your health care provider if you often  feel depressed.  Tell your health care provider if you have ever been abused or do not feel safe at home.   This information is not intended to replace advice given to you by your health care provider. Make sure you discuss any questions you have with your health care provider.   Document Released: 09/12/2010 Document Revised: 03/20/2014 Document Reviewed: 01/29/2013 Elsevier Interactive Patient Education [employer].

## 2015-05-20 NOTE — Progress Notes (Signed)
Subjective:   [name] is a 65 y.o. female who presents for an Initial Medicare Annual Wellness Visit.  Review of Systems     HRA assessment completed during visit;  The Patient was informed that this wellness visit is to identify risk and educate on how to reduce risk for increase disease through lifestyle changes.Seen Dr. Sharlet Salina on 05/03/2015  ROS not done this visit/ the patient states she went on Medicare in January. This visit completed since the patient was here even though no charges will be made.   Describes health as good Dad died of heart attack at 50 but did drink  Medical issues  HTN; great control; exercises/  MDD/ no issue today Hyperlipidemia cho 175; Trig 36; HDL 59 and LDL 108; Ratio 3 (A1c 5.6)   BMI: 28.3/ would like to be 160/ weight is WNL per [facility],. She would like to lose a few pounds  Diet; Endocrinologist has told her she should feel good and she is not gaining more since she is exercising Breakfast drinks a shake or oatmeal; limited breakfast if any Usually eat x 2 per day Eats one meal in the afternoon; sometimes eats or goes out Trying to get away from fried foods; eats some beef; chicken Vegetables;  Doesn't eat at hs; goes to be about 51mn Doesn't track calories  Discussed exercise and weight when exercising;  Educated on food choices and additives; Discussed tracking food x 2 to 3 days to determine how many calories she is actually taking in. It is difficult to know if she is not actually eating enough and this is also hindering her weight loss; Also states she does not eat after 3pm but is not hungry.  Discussed balance and the need to eat snack in the evening or not eating may slow her metabolism down more.    Exercise; exercise am and [facility] [place] 2 times a week Rides the bike; walks 4 miles at gym or outside if possible Stays inside now due to allergies;  Always loved the outdoor and was active Exercises 2 hours a day and  discussed eating something post exercise to replenish muscles; or plan for fruit or other post exercise as she states she is "starving" then.   SAFETY; basement with stairs;  Falls:  no falls Vision; is good but being followed by [facility] currently for blurred vision Safety reviewed for the home;  Bathroom safety; shower in tub;  Community safety; yes Smoke detectors yes Firearms safety / safe / no  Driving accidents and seatbelt/ no Sun protection/ yes  Stressors: states she is good   Medication review/ taking all meds; no issues  Fall assessment no Gait assessment/ no issue; strong;   Mobilization and Functional losses in the last year; no  Sleep patterns/ has anxiety; tramadol   Urinary or fecal incontinence reviewed/ no    Counseling: Hep C will order as the patient would like to have this done. HIV: deferred to Dr. Sharlet Salina; no risk known  Colonoscopy; q 5 years; ? approximately 2 more years; 2019  EKG: ok;  Mammogram 07/2014/ no findings of malignancy/ annual 07/2015 Dexa 08/2010 -0.5 PAP had hyst/  Hearing: 2000hz  right ear; 4000 hz in the left  Ophthalmology exam; last eye check was; eye was blood shot red; sensitive to sun and light; prednisone for eye; sent to [facility];  Started about 6 to 7 months ago  Taking methotrexate currently and eyes are clear now  Immunizations / TD; [facility]; not sure what year PCV 13 / has had a pneumonia shot; will check history to see if noted which one.  Health advice or referrals  Current Care Team reviewed and updated   Cardiac Risk Factors include: advanced age (>46men, >45 women);dyslipidemia;hypertension;family history of premature cardiovascular disease     Objective:    Today's Vitals   05/20/15 1110  BP: 120/80  Height: 5\' 7"  (1.702 m)  Weight: 181 lb 7 oz (82.3 kg)    Current Medications (verified) Outpatient Encounter Prescriptions as of 05/20/2015  Medication Sig  . B Complex-C (SUPER B COMPLEX PO) Take by  mouth.  . Cholecalciferol (D3 SUPER STRENGTH) 2000 UNITS CAPS Take by mouth.  . docusate sodium (COLACE) 100 MG capsule Take 100 mg by mouth daily.  . DULoxetine (CYMBALTA) 60 MG capsule Take 1 capsule (60 mg total) by mouth daily.  . fluticasone (FLONASE) 50 MCG/ACT nasal spray Place 2 sprays into both nostrils daily.  Marland Kitchen losartan-hydrochlorothiazide (HYZAAR) 50-12.5 MG tablet Take 1 tablet by mouth daily.  . methotrexate (RHEUMATREX) 2.5 MG tablet TK 4 TS PO Q SUNDAY AND MONDAY  . Multiple Vitamins-Minerals (MULTIVITAMIN WITH MINERALS) tablet Take 1 tablet by mouth daily.  . NEOMYCIN-POLYMYXIN-HYDROCORTISONE (CORTISPORIN) 1 % SOLN otic solution Place 3 drops into the left ear 3 (three) times daily.  . Omega-3 Fatty Acids (FISH OIL BURP-LESS PO) Take by mouth.  . prednisoLONE acetate (PRED FORTE) 1 % ophthalmic suspension INSTILL ONE DROP BY OPHTHALMIC ROUTE FOUR TIMES EVERY DAY INTO AFFECTED EYE(S)  . simvastatin (ZOCOR) 20 MG tablet Take 1 tablet (20 mg total) by mouth daily.  Marland Kitchen SYNTHROID 100 MCG tablet Take 100 mcg by mouth daily.  . traMADol (ULTRAM) 50 MG tablet Take 50 mg by mouth 3 (three) times daily.  . traZODone (DESYREL) 50 MG tablet TAKE 1/2 TO 2 TABLETS AT NIGHT AS NEEDED FOR SLEEP  . triamcinolone cream (KENALOG) 0.1 % Apply 1 application topically 2 (two) times daily.   No facility-administered encounter medications on file as of 05/20/2015.    Allergies (verified) Review of patient's allergies indicates no known allergies.   History: Past Medical History  Diagnosis Date  . Arthritis   . Blood in stool   . Hyperlipidemia   . Hypertension   . Thyroid disease   . Colon polyps    Past Surgical History  Procedure Laterality Date  . Abdominal hysterectomy     Family History  Problem Relation Age of Onset  . Hypertension Mother   . Arthritis Sister   . Cancer Sister     breast and colon  . Diabetes Sister   . Diabetes Paternal Aunt   . Hypertension Paternal  Grandmother    Social History   Occupational History  . Not on file.   Social History Main Topics  . Smoking status: Never Smoker   . Smokeless tobacco: Not on file  . Alcohol Use: No  . Drug Use: No  . Sexual Activity: Not on file    Tobacco Counseling Counseling given: Yes   Activities of Daily Living In your present state of health, do you have any difficulty performing the following activities: 05/20/2015  Hearing? N  Vision? (No Data)  Difficulty concentrating or making decisions? N  Walking or climbing stairs? N  Dressing or bathing? N  Doing errands, shopping? N  Preparing Food and eating ? N  Using the Toilet? N  In the past six months, have you  accidently leaked urine? N  Do you have problems with loss of bowel control? N  Managing your Medications? N  Managing your Finances? N  Housekeeping or managing your Housekeeping? N    Immunizations and Health Maintenance  There is no immunization history on file for this patient. Health Maintenance Due  Topic Date Due  . Hepatitis C Screening  1950/04/21  . HIV Screening  03/30/1965  . TETANUS/TDAP  03/30/1969  . PAP SMEAR  03/31/1971  . PNA vac Low Risk Adult (1 of 2 - PCV13) 03/31/2015    Patient Care Team: Hoyt Koch, MD as PCP - General (Internal Medicine)  Indicate any recent Medical Services you may have received from other than [facility] providers in the past year (date may be approximate).     Assessment:   Hearing/Vision screen No exam data present  Dietary issues and exercise activities discussed: Current Exercise Habits: Structured exercise class, Type of exercise: strength training/weights;walking (does it all; zumba ), Time (Minutes): > 60, Frequency (Times/Week): 4, Weekly Exercise (Minutes/Week): 0, Intensity: Moderate  Goals    None     Depression Screen PHQ 2/9 Scores 05/20/2015 05/03/2015  PHQ - 2 Score 0 0    Fall Risk Fall Risk  05/20/2015 05/03/2015  Falls in the past year? No  No    Cognitive Function: No flowsheet data found.   Ad8 score 0   Screening Tests Health Maintenance  Topic Date Due  . Hepatitis C Screening  April 11, 1950  . HIV Screening  03/30/1965  . TETANUS/TDAP  03/30/1969  . PAP SMEAR  03/31/1971  . PNA vac Low Risk Adult (1 of 2 - PCV13) 03/31/2015  . INFLUENZA VACCINE  05/02/2016 (Originally 10/12/2014)  . MAMMOGRAM  07/26/2016  . COLONOSCOPY  03/13/2018  . DEXA SCAN  Completed  . ZOSTAVAX  Addressed      Plan:   To monitor food choices for high fructose corn syrup;  Plan post exercise snack and eat a snack at hs.   During the course of the visit, [name] was educated and counseled about the following appropriate screening and preventive services:   Vaccines to include Pneumoccal, Influenza, Hepatitis B, Td, Zostavax, HCV Prevnar due but states she had a pneumonia shot but is not sure which one.Will try to review records for more information on pneumonia vaccines and TD   Electrocardiogram/ states she has had one  Cardiovascular disease screening/ BP excellent today  Colorectal cancer screening/ colonoscopy approx q 5 years; ? Thinks it is due in a couple of years. Will need to check history  Bone density screening/ just completed  Diabetes screening/ neg  Glaucoma screening/ currently being evaluated by eye doctor  Mammography/ completed this year;   Nutrition counseling/ spent 30 minutes discussing the balance of nutrition and exercise; Given resources at www.http://vang.com/   Patient Instructions (the written plan) were given to the patient.    Wynetta Fines, RN   05/20/2015

## 2015-05-20 NOTE — Progress Notes (Signed)
Medical screening examination/treatment/procedure(s) were performed by non-physician practitioner and as supervising physician I was immediately available for consultation/collaboration. I agree with above. Zyon Rosser A Enzo Treu, MD 

## 2015-05-21 LAB — HEPATITIS C ANTIBODY: HCV Ab: NEGATIVE

## 2015-06-29 ENCOUNTER — Other Ambulatory Visit (INDEPENDENT_AMBULATORY_CARE_PROVIDER_SITE_OTHER): Payer: Medicare Other

## 2015-06-29 ENCOUNTER — Ambulatory Visit (INDEPENDENT_AMBULATORY_CARE_PROVIDER_SITE_OTHER): Payer: Medicare Other | Admitting: Internal Medicine

## 2015-06-29 ENCOUNTER — Encounter: Payer: Self-pay | Admitting: Internal Medicine

## 2015-06-29 VITALS — BP 128/88 | HR 81 | Temp 98.1°F | Resp 12 | Ht 67.0 in | Wt 178.1 lb

## 2015-06-29 DIAGNOSIS — M545 Low back pain, unspecified: Secondary | ICD-10-CM | POA: Insufficient documentation

## 2015-06-29 DIAGNOSIS — R109 Unspecified abdominal pain: Secondary | ICD-10-CM

## 2015-06-29 LAB — URINALYSIS, ROUTINE W REFLEX MICROSCOPIC
Bilirubin Urine: NEGATIVE
Ketones, ur: NEGATIVE
Leukocytes, UA: NEGATIVE
Nitrite: NEGATIVE
Specific Gravity, Urine: 1.015 (ref 1.000–1.030)
Total Protein, Urine: NEGATIVE
Urine Glucose: NEGATIVE
Urobilinogen, UA: 0.2 (ref 0.0–1.0)
WBC, UA: NONE SEEN (ref 0–?)
pH: 6 (ref 5.0–8.0)

## 2015-06-29 MED ORDER — TRAMADOL HCL 50 MG PO TABS: 50.0000 mg | ORAL_TABLET | Freq: Two times a day (BID) | ORAL | Status: DC | PRN

## 2015-06-29 NOTE — Assessment & Plan Note (Signed)
Likely muscular from travel, rx for tramadol. Checking U/A for signs of infection or hematuria.

## 2015-06-29 NOTE — Progress Notes (Signed)
   Subjective:    Patient ID: [name], female    DOB: 1950-06-21, 65 y.o.   MRN: [id number]  HPI The patient is a 65 YO female coming in for right flank and back pain. Denies any new activity but did recently get back from cruise and traveled on place to get there and back. About 2 hour plane ride. Denies heavy luggage. Some increased urination but denies specific burning. No fevers or chills. No pain in her stomach or in her legs. Has had tramadol in the past and took 1 that was leftover for the pain and it helped. Is out now. No weight change.   Review of Systems  Constitutional: Negative for fever, activity change, appetite change, fatigue and unexpected weight change.  HENT: Negative for congestion, ear discharge, hearing loss, rhinorrhea, sinus pressure and sore throat.   Respiratory: Negative for cough, chest tightness, shortness of breath and wheezing.   Cardiovascular: Negative for chest pain, palpitations and leg swelling.  Gastrointestinal: Negative for nausea, abdominal pain, diarrhea, constipation and abdominal distention.  Genitourinary: Positive for urgency and frequency.  Musculoskeletal: Positive for back pain. Negative for myalgias, arthralgias and gait problem.  Skin: Negative for color change and wound.  Neurological: Negative.   Psychiatric/Behavioral: Negative.       Objective:   Physical Exam  Constitutional: She is oriented to person, place, and time. She appears well-developed and well-nourished.  Overweight  HENT:  Head: Normocephalic and atraumatic.  Eyes: EOM are normal.  Neck: Normal range of motion.  Cardiovascular: Normal rate and regular rhythm.   No murmur heard. Pulmonary/Chest: Effort normal and breath sounds normal. No respiratory distress. She has no wheezes. She has no rales.  Abdominal: Soft. Bowel sounds are normal. She exhibits no distension. There is no tenderness. There is no rebound.  Musculoskeletal: She exhibits tenderness.  Paraspinal  tenderness left flank lumbar region, no radiation and no spinal tenderness.   Neurological: She is alert and oriented to person, place, and time. Coordination normal.  Skin: Skin is warm and dry.  Psychiatric: She has a normal mood and affect.   Filed Vitals:   06/29/15 1106  BP: 128/88  Pulse: 81  Temp: 98.1 F (36.7 C)  TempSrc: Oral  Resp: 12  Height: 5\' 7"  (1.702 m)  Weight: 178 lb 1.9 oz (80.795 kg)  SpO2: 98%      Assessment & Plan:

## 2015-06-29 NOTE — Progress Notes (Signed)
Pre visit review using our clinic review tool, if applicable. No additional management support is needed unless otherwise documented below in the visit note. 

## 2015-06-29 NOTE — Patient Instructions (Signed)
We are checking the urine today for infection and have refilled the tramadol for pain.   Back Exercises The following exercises strengthen the muscles that help to support the back. They also help to keep the lower back flexible. Doing these exercises can help to prevent back pain or lessen existing pain. If you have back pain or discomfort, try doing these exercises 2-3 times each day or as told by your health care provider. When the pain goes away, do them once each day, but increase the number of times that you repeat the steps for each exercise (do more repetitions). If you do not have back pain or discomfort, do these exercises once each day or as told by your health care provider. EXERCISES Single Knee to Chest Repeat these steps 3-5 times for each leg: 1. Lie on your back on a firm bed or the floor with your legs extended. 2. Bring one knee to your chest. Your other leg should stay extended and in contact with the floor. 3. Hold your knee in place by grabbing your knee or thigh. 4. Pull on your knee until you feel a gentle stretch in your lower back. 5. Hold the stretch for 10-30 seconds. 6. Slowly release and straighten your leg. Pelvic Tilt Repeat these steps 5-10 times: 1. Lie on your back on a firm bed or the floor with your legs extended. 2. Bend your knees so they are pointing toward the ceiling and your feet are flat on the floor. 3. Tighten your lower abdominal muscles to press your lower back against the floor. This motion will tilt your pelvis so your tailbone points up toward the ceiling instead of pointing to your feet or the floor. 4. With gentle tension and even breathing, hold this position for 5-10 seconds. Cat-Cow Repeat these steps until your lower back becomes more flexible: 1. Get into a hands-and-knees position on a firm surface. Keep your hands under your shoulders, and keep your knees under your hips. You may place padding under your knees for comfort. 2. Let your  head hang down, and point your tailbone toward the floor so your lower back becomes rounded like the back of a cat. 3. Hold this position for 5 seconds. 4. Slowly lift your head and point your tailbone up toward the ceiling so your back forms a sagging arch like the back of a cow. 5. Hold this position for 5 seconds. Press-Ups Repeat these steps 5-10 times: 1. Lie on your abdomen (face-down) on the floor. 2. Place your palms near your head, about shoulder-width apart. 3. While you keep your back as relaxed as possible and keep your hips on the floor, slowly straighten your arms to raise the top half of your body and lift your shoulders. Do not use your back muscles to raise your upper torso. You may adjust the placement of your hands to make yourself more comfortable. 4. Hold this position for 5 seconds while you keep your back relaxed. 5. Slowly return to lying flat on the floor. Bridges Repeat these steps 10 times: 1. Lie on your back on a firm surface. 2. Bend your knees so they are pointing toward the ceiling and your feet are flat on the floor. 3. Tighten your buttocks muscles and lift your buttocks off of the floor until your waist is at almost the same height as your knees. You should feel the muscles working in your buttocks and the back of your thighs. If you do not feel these  muscles, slide your feet 1-2 inches farther away from your buttocks. 4. Hold this position for 3-5 seconds. 5. Slowly lower your hips to the starting position, and allow your buttocks muscles to relax completely. If this exercise is too easy, try doing it with your arms crossed over your chest. Abdominal Crunches Repeat these steps 5-10 times: 1. Lie on your back on a firm bed or the floor with your legs extended. 2. Bend your knees so they are pointing toward the ceiling and your feet are flat on the floor. 3. Cross your arms over your chest. 4. Tip your chin slightly toward your chest without bending your  neck. 5. Tighten your abdominal muscles and slowly raise your trunk (torso) high enough to lift your shoulder blades a tiny bit off of the floor. Avoid raising your torso higher than that, because it can put too much stress on your low back and it does not help to strengthen your abdominal muscles. 6. Slowly return to your starting position. Back Lifts Repeat these steps 5-10 times: 1. Lie on your abdomen (face-down) with your arms at your sides, and rest your forehead on the floor. 2. Tighten the muscles in your legs and your buttocks. 3. Slowly lift your chest off of the floor while you keep your hips pressed to the floor. Keep the back of your head in line with the curve in your back. Your eyes should be looking at the floor. 4. Hold this position for 3-5 seconds. 5. Slowly return to your starting position. SEEK MEDICAL CARE IF:  Your back pain or discomfort gets much worse when you do an exercise.  Your back pain or discomfort does not lessen within 2 hours after you exercise. If you have any of these problems, stop doing these exercises right away. Do not do them again unless your health care provider says that you can. SEEK IMMEDIATE MEDICAL CARE IF:  You develop sudden, severe back pain. If this happens, stop doing the exercises right away. Do not do them again unless your health care provider says that you can.   This information is not intended to replace advice given to you by your health care provider. Make sure you discuss any questions you have with your health care provider.   Document Released: 04/06/2004 Document Revised: 11/18/2014 Document Reviewed: 04/23/2014 Elsevier Interactive Patient Education [employer].

## 2015-06-30 ENCOUNTER — Telehealth: Payer: Self-pay | Admitting: Internal Medicine

## 2015-06-30 NOTE — Telephone Encounter (Signed)
Pt called in said that [facility] doesn't have the traMADol (ULTRAM) 50 MG tablet [id number]

## 2015-07-01 NOTE — Telephone Encounter (Signed)
Re-sent to pharmacy.

## 2015-07-02 NOTE — Telephone Encounter (Signed)
Pt called stated this her 3rd time try to get this medication and it still not at the drug store. Please check, she just spoke with [facility] and they do not have this med. Please call pt once this is done.

## 2015-07-02 NOTE — Telephone Encounter (Signed)
Verbally called into pharmacy.

## 2015-07-05 ENCOUNTER — Other Ambulatory Visit: Payer: Self-pay

## 2015-07-05 DIAGNOSIS — Z1231 Encounter for screening mammogram for malignant neoplasm of breast: Secondary | ICD-10-CM

## 2015-07-10 ENCOUNTER — Other Ambulatory Visit: Payer: Self-pay | Admitting: Internal Medicine

## 2015-07-28 DIAGNOSIS — E559 Vitamin D deficiency, unspecified: Secondary | ICD-10-CM | POA: Insufficient documentation

## 2015-08-02 ENCOUNTER — Other Ambulatory Visit: Payer: Self-pay | Admitting: Internal Medicine

## 2015-08-13 ENCOUNTER — Ambulatory Visit (INDEPENDENT_AMBULATORY_CARE_PROVIDER_SITE_OTHER): Payer: Medicare Other | Admitting: Family

## 2015-08-13 ENCOUNTER — Encounter: Payer: Self-pay | Admitting: Family

## 2015-08-13 VITALS — BP 112/82 | HR 75 | Temp 98.1°F | Ht 67.0 in | Wt 176.0 lb

## 2015-08-13 DIAGNOSIS — J309 Allergic rhinitis, unspecified: Secondary | ICD-10-CM

## 2015-08-13 DIAGNOSIS — R059 Cough, unspecified: Secondary | ICD-10-CM

## 2015-08-13 DIAGNOSIS — R05 Cough: Secondary | ICD-10-CM

## 2015-08-13 MED ORDER — HYDROCODONE-HOMATROPINE 5-1.5 MG/5ML PO SYRP: 5.0000 mL | ORAL_SOLUTION | Freq: Every evening | ORAL | Status: DC | PRN

## 2015-08-13 MED ORDER — LORATADINE 10 MG PO TABS: 10.0000 mg | ORAL_TABLET | Freq: Every day | ORAL | Status: DC

## 2015-08-13 MED ORDER — AMOXICILLIN 500 MG PO CAPS: 500.0000 mg | ORAL_CAPSULE | Freq: Two times a day (BID) | ORAL | Status: DC

## 2015-08-13 NOTE — Progress Notes (Signed)
Subjective:    Patient ID: [name], female    DOB: 12/21/1950, 66 y.o.   MRN: [id number]   [name] is a 66 y.o. female who presents today for an acute visit.    Cough This is a new problem. The current episode started in the past 7 days. The problem has been gradually improving. The problem occurs every few hours. The cough is non-productive. Associated symptoms include postnasal drip and wheezing. Pertinent negatives include no chest pain, chills, ear pain (resolved), fever, headaches (resolved), sore throat (resolved) or shortness of breath. The symptoms are aggravated by dust. She has tried OTC cough suppressant (honey, lemon tea,) for the symptoms. The treatment provided mild relief. Her past medical history is significant for environmental allergies. There is no history of asthma or COPD.   Past Medical History  Diagnosis Date  . Arthritis   . Blood in stool   . Hyperlipidemia   . Hypertension   . Thyroid disease   . Colon polyps    Allergies: Review of patient's allergies indicates no known allergies. Current Outpatient Prescriptions on File Prior to Visit  Medication Sig Dispense Refill  . B Complex-C (SUPER B COMPLEX PO) Take by mouth.    . Cholecalciferol (D3 SUPER STRENGTH) 2000 UNITS CAPS Take by mouth.    . docusate sodium (COLACE) 100 MG capsule Take 100 mg by mouth daily.    . DULoxetine (CYMBALTA) 60 MG capsule Take 1 capsule (60 mg total) by mouth daily. 90 capsule 2  . fluticasone (FLONASE) 50 MCG/ACT nasal spray Place 2 sprays into both nostrils daily. 16 g 6  . losartan-hydrochlorothiazide (HYZAAR) 50-12.5 MG tablet Take 1 tablet by mouth daily. 90 tablet 2  . methotrexate (RHEUMATREX) 2.5 MG tablet TK 4 TS PO Q SUNDAY AND MONDAY  0  . Multiple Vitamins-Minerals (MULTIVITAMIN WITH MINERALS) tablet Take 1 tablet by mouth daily.    . Omega-3 Fatty Acids (FISH OIL BURP-LESS PO) Take by mouth.    . simvastatin (ZOCOR) 20 MG tablet Take 1 tablet (20 mg total)  by mouth daily. 90 tablet 2  . SYNTHROID 100 MCG tablet Take 100 mcg by mouth daily.  6  . traZODone (DESYREL) 50 MG tablet TAKE 1/2 TO 2 TABLETS BY MOUTH AT NIGHT AS NEEDED FOR SLEEP 30 tablet 0  . NEOMYCIN-POLYMYXIN-HYDROCORTISONE (CORTISPORIN) 1 % SOLN otic solution Place 3 drops into the left ear 3 (three) times daily. (Patient not taking: Reported on 08/13/2015) 10 mL 0  . prednisoLONE acetate (PRED FORTE) 1 % ophthalmic suspension Reported on 08/13/2015  0  . traMADol (ULTRAM) 50 MG tablet Take 1 tablet (50 mg total) by mouth every 12 (twelve) hours as needed for moderate pain. (Patient not taking: Reported on 08/13/2015) 30 tablet 0  . triamcinolone cream (KENALOG) 0.1 % Apply 1 application topically 2 (two) times daily. (Patient not taking: Reported on 08/13/2015) 30 g 0   No current facility-administered medications on file prior to visit.    Social History  Substance Use Topics  . Smoking status: Never Smoker   . Smokeless tobacco: None  . Alcohol Use: No    Review of Systems  Constitutional: Negative for fever and chills.  HENT: Positive for congestion (clear and thick), postnasal drip and voice change. Negative for ear pain (resolved) and sore throat (resolved).   Eyes: Positive for discharge (clear, runny) and itching.  Respiratory: Positive for cough and wheezing. Negative for shortness of breath.   Cardiovascular: Negative for  chest pain and palpitations.  Gastrointestinal: Negative for nausea and vomiting.  Allergic/Immunologic: Positive for environmental allergies.  Neurological: Negative for dizziness and headaches (resolved).      Objective:    BP 112/82 mmHg  Pulse 75  Temp(Src) 98.1 F (36.7 C) (Oral)  Ht 5\' 7"  (1.702 m)  Wt 176 lb (79.833 kg)  BMI 27.56 kg/m2  SpO2 97%   Physical Exam  Constitutional: She appears well-developed and well-nourished.  HENT:  Head: Normocephalic and atraumatic.  Right Ear: Hearing, tympanic membrane, external ear and ear canal  normal. No drainage, swelling or tenderness. No foreign bodies. Tympanic membrane is not erythematous and not bulging. No middle ear effusion. No decreased hearing is noted.  Left Ear: Hearing, tympanic membrane, external ear and ear canal normal. No drainage, swelling or tenderness. No foreign bodies. Tympanic membrane is not erythematous and not bulging.  No middle ear effusion. No decreased hearing is noted.  Nose: Rhinorrhea present. Right sinus exhibits no maxillary sinus tenderness and no frontal sinus tenderness. Left sinus exhibits no maxillary sinus tenderness and no frontal sinus tenderness.  Mouth/Throat: Uvula is midline, oropharynx is clear and moist and mucous membranes are normal. No oropharyngeal exudate, posterior oropharyngeal edema, posterior oropharyngeal erythema or tonsillar abscesses.  Eyes: Conjunctivae are normal.  Cardiovascular: Regular rhythm, normal heart sounds and normal pulses.   Pulmonary/Chest: Effort normal and breath sounds normal. She has no wheezes. She has no rhonchi. She has no rales.  Lymphadenopathy:       Head (right side): No submental, no submandibular, no tonsillar, no preauricular, no posterior auricular and no occipital adenopathy present.       Head (left side): No submental, no submandibular, no tonsillar, no preauricular, no posterior auricular and no occipital adenopathy present.    She has no cervical adenopathy.  Neurological: She is alert.  Skin: Skin is warm and dry.  Psychiatric: She has a normal mood and affect. Her speech is normal and behavior is normal. Thought content normal.  Vitals reviewed.      Assessment & Plan:   1. Cough Suspect viral versus allergic rhinitis. Advised patient to continue conservative treatment at home and if not better then she may start antibiotic. She agreed with this plan.  - amoxicillin (AMOXIL) 500 MG capsule; Take 1 capsule (500 mg total) by mouth 2 (two) times daily.  Dispense: 14 capsule; Refill: 0 -  HYDROcodone-homatropine (HYCODAN) 5-1.5 MG/5ML syrup; Take 5 mLs by mouth at bedtime as needed for cough.  Dispense: 40 mL; Refill: 0  2. Allergic rhinitis, unspecified allergic rhinitis type Trial of Claritin to see if this helps ongoing seasonal allergies.  - loratadine (CLARITIN) 10 MG tablet; Take 1 tablet (10 mg total) by mouth daily.  Dispense: 30 tablet; Refill: 11    I am having Ms. Cherry maintain her SYNTHROID, docusate sodium, B Complex-C (SUPER B COMPLEX PO), Omega-3 Fatty Acids (FISH OIL BURP-LESS PO), Cholecalciferol, multivitamin with minerals, DULoxetine, losartan-hydrochlorothiazide, simvastatin, methotrexate, prednisoLONE acetate, triamcinolone cream, fluticasone, NEOMYCIN-POLYMYXIN-HYDROCORTISONE, traMADol, and traZODone.   No orders of the defined types were placed in this encounter.     Start medications as prescribed and explained to patient on After Visit Summary ( AVS). Risks, benefits, and alternatives of the medications and treatment plan prescribed today were discussed, and patient expressed understanding.   Education regarding symptom management and diagnosis given to patient.   Follow-up:Plan follow-up and return precautions given if any worsening symptoms or change in condition.   Continue to follow with  Hoyt Koch, MD for routine health maintenance.   [name] and I agreed with plan.   Mable Paris, FNP

## 2015-08-13 NOTE — Progress Notes (Signed)
Pre visit review using our clinic review tool, if applicable. No additional management support is needed unless otherwise documented below in the visit note. 

## 2015-08-13 NOTE — Patient Instructions (Signed)
Please take cough medication at night only as needed. As we discussed, I do not recommend dosing throughout the day as coughing is a protective mechanism . It also helps to break up thick mucous.  Do not take cough suppressants with alcohol as can lead to trouble breathing. Advise caution if taking cough suppressant and operating machinery ( i.e driving a car) as you may feel very tired.    I suspect that your infection is viral versus allergic in nature.  As discussed, I advise that you wait to fill the antibiotic after 1-2 days of symptom management to see if your symptoms improve. If you do not show improvement, you may take the antibiotic as prescribed.   Allergic Rhinitis Allergic rhinitis is when the mucous membranes in the nose respond to allergens. Allergens are particles in the air that cause your body to have an allergic reaction. This causes you to release allergic antibodies. Through a chain of events, these eventually cause you to release histamine into the blood stream. Although meant to protect the body, it is this release of histamine that causes your discomfort, such as frequent sneezing, congestion, and an itchy, runny nose.  CAUSES Seasonal allergic rhinitis (hay fever) is caused by pollen allergens that may come from grasses, trees, and weeds. Year-round allergic rhinitis (perennial allergic rhinitis) is caused by allergens such as house dust mites, pet dander, and mold spores. SYMPTOMS  Nasal stuffiness (congestion).  Itchy, runny nose with sneezing and tearing of the eyes. DIAGNOSIS Your health care provider can help you determine the allergen or allergens that trigger your symptoms. If you and your health care provider are unable to determine the allergen, skin or blood testing may be used. Your health care provider will diagnose your condition after taking your health history and performing a physical exam. Your health care provider may assess you for other related  conditions, such as asthma, pink eye, or an ear infection. TREATMENT Allergic rhinitis does not have a cure, but it can be controlled by:  Medicines that block allergy symptoms. These may include allergy shots, nasal sprays, and oral antihistamines.  Avoiding the allergen. Hay fever may often be treated with antihistamines in pill or nasal spray forms. Antihistamines block the effects of histamine. There are over-the-counter medicines that may help with nasal congestion and swelling around the eyes. Check with your health care provider before taking or giving this medicine. If avoiding the allergen or the medicine prescribed do not work, there are many new medicines your health care provider can prescribe. Stronger medicine may be used if initial measures are ineffective. Desensitizing injections can be used if medicine and avoidance does not work. Desensitization is when a patient is given ongoing shots until the body becomes less sensitive to the allergen. Make sure you follow up with your health care provider if problems continue. HOME CARE INSTRUCTIONS It is not possible to completely avoid allergens, but you can reduce your symptoms by taking steps to limit your exposure to them. It helps to know exactly what you are allergic to so that you can avoid your specific triggers. SEEK MEDICAL CARE IF:  You have a fever.  You develop a cough that does not stop easily (persistent).  You have shortness of breath.  You start wheezing.  Symptoms interfere with normal daily activities.   This information is not intended to replace advice given to you by your health care provider. Make sure you discuss any questions you have with your health care  provider.   Document Released: 11/22/2000 Document Revised: 03/20/2014 Document Reviewed: 11/04/2012 Elsevier Interactive Patient Education [employer].

## 2015-08-17 ENCOUNTER — Ambulatory Visit: Payer: Medicare Other

## 2015-08-23 ENCOUNTER — Other Ambulatory Visit: Payer: Self-pay | Admitting: *Deleted

## 2015-08-23 MED ORDER — TRAZODONE HCL 50 MG PO TABS: ORAL_TABLET | ORAL | Status: DC

## 2015-08-23 NOTE — Telephone Encounter (Signed)
Left msg on triage pharmacy sent request to have Trazodone refill. Called pt back no answer MOM rx sent to [facility]...[name]

## 2015-09-12 ENCOUNTER — Other Ambulatory Visit: Payer: Self-pay | Admitting: Internal Medicine

## 2015-09-30 ENCOUNTER — Ambulatory Visit
Admission: RE | Admit: 2015-09-30 | Discharge: 2015-09-30 | Disposition: A | Discharge status: Home / Self Care | Payer: Medicare Other | Source: Ambulatory Visit

## 2015-09-30 DIAGNOSIS — Z1231 Encounter for screening mammogram for malignant neoplasm of breast: Secondary | ICD-10-CM

## 2015-10-05 ENCOUNTER — Other Ambulatory Visit: Payer: Self-pay | Admitting: Internal Medicine

## 2015-10-27 ENCOUNTER — Other Ambulatory Visit: Payer: Self-pay | Admitting: Internal Medicine

## 2015-11-16 ENCOUNTER — Other Ambulatory Visit: Payer: Self-pay | Admitting: Internal Medicine

## 2015-12-06 ENCOUNTER — Other Ambulatory Visit: Payer: Self-pay | Admitting: Internal Medicine

## 2015-12-20 ENCOUNTER — Other Ambulatory Visit: Payer: Self-pay | Admitting: *Deleted

## 2015-12-20 MED ORDER — SIMVASTATIN 20 MG PO TABS
20.0000 mg | ORAL_TABLET | Freq: Every day | ORAL | 1 refills | Status: DC
Start: 2015-12-20 — End: 2016-06-07

## 2015-12-20 MED ORDER — LOSARTAN POTASSIUM-HCTZ 50-12.5 MG PO TABS: 1.0000 | ORAL_TABLET | Freq: Every day | ORAL | 1 refills | Status: DC

## 2015-12-20 MED ORDER — TRAZODONE HCL 50 MG PO TABS: ORAL_TABLET | ORAL | 0 refills | Status: DC

## 2015-12-20 MED ORDER — DULOXETINE HCL 60 MG PO CPEP: 60.0000 mg | ORAL_CAPSULE | Freq: Every day | ORAL | 1 refills | Status: DC

## 2016-01-20 ENCOUNTER — Other Ambulatory Visit: Payer: Self-pay | Admitting: Internal Medicine

## 2016-02-24 ENCOUNTER — Ambulatory Visit: Payer: Medicare Other | Admitting: Internal Medicine

## 2016-03-07 ENCOUNTER — Encounter: Payer: Self-pay | Admitting: Internal Medicine

## 2016-03-07 ENCOUNTER — Ambulatory Visit (INDEPENDENT_AMBULATORY_CARE_PROVIDER_SITE_OTHER): Payer: Medicare Other | Admitting: Internal Medicine

## 2016-03-07 ENCOUNTER — Other Ambulatory Visit (INDEPENDENT_AMBULATORY_CARE_PROVIDER_SITE_OTHER): Payer: Medicare Other

## 2016-03-07 VITALS — BP 120/74 | HR 79 | Temp 98.6°F | Resp 14 | Ht 67.0 in | Wt 180.5 lb

## 2016-03-07 DIAGNOSIS — Z01810 Encounter for preprocedural cardiovascular examination: Secondary | ICD-10-CM | POA: Diagnosis not present

## 2016-03-07 DIAGNOSIS — Z0000 Encounter for general adult medical examination without abnormal findings: Secondary | ICD-10-CM | POA: Insufficient documentation

## 2016-03-07 LAB — COMPREHENSIVE METABOLIC PANEL
ALT: 10 U/L (ref 0–35)
AST: 15 U/L (ref 0–37)
Albumin: 4.2 g/dL (ref 3.5–5.2)
Alkaline Phosphatase: 68 U/L (ref 39–117)
BUN: 21 mg/dL (ref 6–23)
CO2: 29 mEq/L (ref 19–32)
Calcium: 9.2 mg/dL (ref 8.4–10.5)
Chloride: 104 mEq/L (ref 96–112)
Creatinine, Ser: 0.73 mg/dL (ref 0.40–1.20)
GFR: 102.6 mL/min (ref >60.00)
Glucose, Bld: 92 mg/dL (ref 70–99)
Potassium: 3.9 mEq/L (ref 3.5–5.1)
Sodium: 140 mEq/L (ref 135–145)
Total Bilirubin: 0.5 mg/dL (ref 0.2–1.2)
Total Protein: 7.2 g/dL (ref 6.0–8.3)

## 2016-03-07 LAB — LIPID PANEL
Cholesterol: 170 mg/dL (ref 0–200)
HDL: 64.4 mg/dL (ref >39.00)
LDL Cholesterol: 95 mg/dL (ref 0–99)
NonHDL: 106.01
Total CHOL/HDL Ratio: 3
Triglycerides: 55 mg/dL (ref 0.0–149.0)
VLDL: 11 mg/dL (ref 0.0–40.0)

## 2016-03-07 LAB — CBC
HCT: 39.2 % (ref 36.0–46.0)
Hemoglobin: 13.4 g/dL (ref 12.0–15.0)
MCHC: 34.2 g/dL (ref 30.0–36.0)
MCV: 92.1 fl (ref 78.0–100.0)
Platelets: 243 10*3/uL (ref 150.0–400.0)
RBC: 4.25 Mil/uL (ref 3.87–5.11)
RDW: 14.5 % (ref 11.5–15.5)
WBC: 6.1 10*3/uL (ref 4.0–10.5)

## 2016-03-07 MED ORDER — TRAZODONE HCL 100 MG PO TABS: 100.0000 mg | ORAL_TABLET | Freq: Every day | ORAL | 3 refills | Status: DC

## 2016-03-07 NOTE — Patient Instructions (Addendum)
Your EKG is normal. We will check the labs today and call you back with the results.   Good luck with the operation on your knee and recovery!

## 2016-03-07 NOTE — Progress Notes (Signed)
Pre visit review using our clinic review tool, if applicable. No additional management support is needed unless otherwise documented below in the visit note. 

## 2016-03-07 NOTE — Progress Notes (Signed)
   Subjective:    Patient ID: [name], female    DOB: 02-Oct-1950, 65 y.o.   MRN: [id number]  HPI The patient is a 65 YO female coming in for pre-op cardiovascular exam. She is getting an elective left knee scope. This does limit her walking to some minor extent. She is able to walk up a flight of stairs without stopping. She used to walk 4 miles a day until her knee is hurting her more. She denies chest pains with activity or SOB. No fevers or chills. No new problems.   PMH, [facility], social history reviewed and updated.   Review of Systems  Constitutional: Positive for activity change. Negative for appetite change, fatigue, fever and unexpected weight change.  HENT: Negative.   Eyes: Negative.   Respiratory: Negative.   Cardiovascular: Negative.   Gastrointestinal: Negative.   Musculoskeletal: Negative.   Skin: Negative.   Neurological: Negative.   Psychiatric/Behavioral: Negative.       Objective:   Physical Exam  Constitutional: She is oriented to person, place, and time. She appears well-developed and well-nourished.  HENT:  Head: Normocephalic and atraumatic.  Eyes: EOM are normal.  Neck: Normal range of motion.  Cardiovascular: Normal rate and regular rhythm.   Pulmonary/Chest: Effort normal and breath sounds normal. No respiratory distress. She has no wheezes. She has no rales.  Abdominal: Soft. She exhibits no distension. There is no tenderness. There is no rebound.  Musculoskeletal: She exhibits no edema.  Neurological: She is alert and oriented to person, place, and time. Coordination normal.  Skin: Skin is warm and dry.   Vitals:   03/07/16 1432  BP: 120/74  Pulse: 79  Resp: 14  Temp: 98.6 F (37 C)  TempSrc: Oral  SpO2: 98%  Weight: 180 lb 8 oz (81.9 kg)  Height: 5\' 7"  (1.702 m)   EKG: HR 65, sinus, axis okay, intervals normal, lead 2 with flat (consider placement problem), no st or t wave changes, no prior to compare   Assessment & Plan:

## 2016-03-07 NOTE — Assessment & Plan Note (Signed)
This represents a low risk surgery. Last labs normal but rechecking today. EKG without findings today and low risk overall. Would not recommend further testing prior to surgery. Non-smoker. Medical history reviewed and updated.

## 2016-03-13 HISTORY — PX: KNEE SURGERY: SHX244

## 2016-03-31 ENCOUNTER — Other Ambulatory Visit: Payer: Self-pay | Admitting: Internal Medicine

## 2016-05-03 ENCOUNTER — Encounter: Payer: TRICARE For Life (TFL) | Admitting: Internal Medicine

## 2016-06-07 ENCOUNTER — Other Ambulatory Visit: Payer: Self-pay | Admitting: Internal Medicine

## 2016-08-23 ENCOUNTER — Encounter: Payer: TRICARE For Life (TFL) | Admitting: Internal Medicine

## 2016-08-31 ENCOUNTER — Encounter: Payer: TRICARE For Life (TFL) | Admitting: Internal Medicine

## 2016-08-31 ENCOUNTER — Other Ambulatory Visit: Payer: Self-pay | Admitting: Internal Medicine

## 2016-08-31 DIAGNOSIS — Z1231 Encounter for screening mammogram for malignant neoplasm of breast: Secondary | ICD-10-CM

## 2016-09-01 LAB — CBC AND DIFFERENTIAL
HCT: 44 (ref 36–46)
Hemoglobin: 13.6 (ref 12.0–16.0)
Neutrophils Absolute: 3
Platelets: 249 (ref 150–399)
WBC: 5.4

## 2016-09-01 LAB — HEPATIC FUNCTION PANEL
ALT: 12 (ref 7–35)
AST: 15 (ref 13–35)
Alkaline Phosphatase: 73 (ref 25–125)
Bilirubin, Total: 0.5

## 2016-09-01 LAB — BASIC METABOLIC PANEL
BUN: 18 (ref 4–21)
Creatinine: 0.9 (ref 0.5–1.1)
Glucose: 102
Potassium: 3.6 (ref 3.4–5.3)
Sodium: 143 (ref 137–147)

## 2016-09-07 ENCOUNTER — Other Ambulatory Visit: Payer: Self-pay | Admitting: Internal Medicine

## 2016-09-08 NOTE — Telephone Encounter (Signed)
Does patient need routine visit

## 2016-09-12 ENCOUNTER — Encounter: Payer: Self-pay | Admitting: Internal Medicine

## 2016-09-12 NOTE — Progress Notes (Unsigned)
Results entered and sent to scan  

## 2016-10-03 ENCOUNTER — Ambulatory Visit
Admission: RE | Admit: 2016-10-03 | Discharge: 2016-10-03 | Disposition: A | Discharge status: Home / Self Care | Payer: Medicare Other | Source: Ambulatory Visit | Attending: Internal Medicine | Admitting: Internal Medicine

## 2016-10-03 DIAGNOSIS — Z1231 Encounter for screening mammogram for malignant neoplasm of breast: Secondary | ICD-10-CM

## 2016-10-09 ENCOUNTER — Telehealth: Payer: Self-pay | Admitting: Internal Medicine

## 2016-10-09 MED ORDER — LORATADINE 10 MG PO TABS: 10.0000 mg | ORAL_TABLET | Freq: Every day | ORAL | 3 refills | Status: DC

## 2016-10-09 MED ORDER — FLUTICASONE PROPIONATE 50 MCG/ACT NA SUSP: 2.0000 | Freq: Every day | NASAL | 3 refills | Status: DC

## 2016-10-09 NOTE — Telephone Encounter (Signed)
Notified pt rx's sent to [facility]...[name]

## 2016-10-09 NOTE — Telephone Encounter (Signed)
Pt called stating that she was previously given Flonase and Claritin but she did not use the refills that she was given when it was prescribed. She is wanting to fill these again. Can this be sent over for her?

## 2016-12-04 ENCOUNTER — Other Ambulatory Visit: Payer: Self-pay | Admitting: Family

## 2016-12-13 ENCOUNTER — Encounter: Payer: Self-pay | Admitting: Internal Medicine

## 2016-12-13 ENCOUNTER — Other Ambulatory Visit (INDEPENDENT_AMBULATORY_CARE_PROVIDER_SITE_OTHER): Payer: Medicare Other

## 2016-12-13 ENCOUNTER — Ambulatory Visit (INDEPENDENT_AMBULATORY_CARE_PROVIDER_SITE_OTHER): Payer: Medicare Other | Admitting: Internal Medicine

## 2016-12-13 VITALS — BP 120/80 | HR 68 | Temp 97.9°F | Ht 67.0 in | Wt 176.0 lb

## 2016-12-13 DIAGNOSIS — E039 Hypothyroidism, unspecified: Secondary | ICD-10-CM

## 2016-12-13 DIAGNOSIS — E785 Hyperlipidemia, unspecified: Secondary | ICD-10-CM | POA: Diagnosis not present

## 2016-12-13 DIAGNOSIS — Z23 Encounter for immunization: Secondary | ICD-10-CM

## 2016-12-13 DIAGNOSIS — I10 Essential (primary) hypertension: Secondary | ICD-10-CM | POA: Diagnosis not present

## 2016-12-13 DIAGNOSIS — Z0000 Encounter for general adult medical examination without abnormal findings: Secondary | ICD-10-CM

## 2016-12-13 LAB — CBC
HCT: 40.8 % (ref 36.0–46.0)
Hemoglobin: 13.9 g/dL (ref 12.0–15.0)
MCHC: 34.1 g/dL (ref 30.0–36.0)
MCV: 94.4 fl (ref 78.0–100.0)
Platelets: 250 10*3/uL (ref 150.0–400.0)
RBC: 4.32 Mil/uL (ref 3.87–5.11)
RDW: 15.1 % (ref 11.5–15.5)
WBC: 4.6 10*3/uL (ref 4.0–10.5)

## 2016-12-13 LAB — LIPID PANEL
Cholesterol: 172 mg/dL (ref 0–200)
HDL: 57.4 mg/dL (ref >39.00)
LDL Cholesterol: 104 mg/dL — ABNORMAL HIGH (ref 0–99)
NonHDL: 114.82
Total CHOL/HDL Ratio: 3
Triglycerides: 55 mg/dL (ref 0.0–149.0)
VLDL: 11 mg/dL (ref 0.0–40.0)

## 2016-12-13 LAB — T4, FREE: Free T4: 1.07 ng/dL (ref 0.60–1.60)

## 2016-12-13 LAB — COMPREHENSIVE METABOLIC PANEL
ALT: 9 U/L (ref 0–35)
AST: 13 U/L (ref 0–37)
Albumin: 4.1 g/dL (ref 3.5–5.2)
Alkaline Phosphatase: 58 U/L (ref 39–117)
BUN: 18 mg/dL (ref 6–23)
CO2: 28 mEq/L (ref 19–32)
Calcium: 9.5 mg/dL (ref 8.4–10.5)
Chloride: 105 mEq/L (ref 96–112)
Creatinine, Ser: 0.79 mg/dL (ref 0.40–1.20)
GFR: 93.44 mL/min (ref >60.00)
Glucose, Bld: 101 mg/dL — ABNORMAL HIGH (ref 70–99)
Potassium: 4 mEq/L (ref 3.5–5.1)
Sodium: 141 mEq/L (ref 135–145)
Total Bilirubin: 0.6 mg/dL (ref 0.2–1.2)
Total Protein: 7.4 g/dL (ref 6.0–8.3)

## 2016-12-13 LAB — TSH: TSH: 1.13 u[IU]/mL (ref 0.35–4.50)

## 2016-12-13 MED ORDER — CLOBETASOL PROPIONATE 0.05 % EX CREA: 1.0000 "application " | TOPICAL_CREAM | Freq: Two times a day (BID) | CUTANEOUS | 0 refills | Status: DC

## 2016-12-13 NOTE — Progress Notes (Signed)
   Subjective:    Patient ID: [name], female    DOB: 27-May-1950, 66 y.o.   MRN: [id number]  HPI Here for medicare wellness and physical, no new complaints. Please see A/P for status and treatment of chronic medical problems.   Diet: heart healthy Physical activity: sedentary Depression/mood screen: negative Hearing: intact to whispered voice Visual acuity: grossly normal, performs annual eye exam  ADLs: capable Fall risk: none Home safety: good Cognitive evaluation: intact to orientation, naming, recall and repetition EOL planning: adv directives discussed  I have personally reviewed and have noted 1. The patient's medical and social history - reviewed today no changes 2. Their use of alcohol, tobacco or illicit drugs 3. Their current medications and supplements 4. The patient's functional ability including ADL's, fall risks, home safety risks and hearing or visual impairment. 5. Diet and physical activities 6. Evidence for depression or mood disorders 7. Care team reviewed and updated (available in snapshot)  Review of Systems  Constitutional: Negative.   HENT: Negative.   Eyes: Negative.   Respiratory: Negative for cough, chest tightness and shortness of breath.   Cardiovascular: Negative for chest pain, palpitations and leg swelling.  Gastrointestinal: Negative for abdominal distention, abdominal pain, constipation, diarrhea, nausea and vomiting.  Musculoskeletal: Positive for arthralgias.  Skin: Negative.   Neurological: Negative.   Psychiatric/Behavioral: Negative.       Objective:   Physical Exam  Constitutional: She is oriented to person, place, and time. She appears well-developed and well-nourished.  HENT:  Head: Normocephalic and atraumatic.  Eyes: EOM are normal.  Neck: Normal range of motion.  Cardiovascular: Normal rate and regular rhythm.   Pulmonary/Chest: Effort normal and breath sounds normal. No respiratory distress. She has no wheezes. She has  no rales.  Abdominal: Soft. Bowel sounds are normal. She exhibits no distension. There is no tenderness. There is no rebound.  Musculoskeletal: She exhibits no edema.  Neurological: She is alert and oriented to person, place, and time. Coordination normal.  Skin: Skin is warm and dry.  Psychiatric: She has a normal mood and affect.   Vitals:   12/13/16 1012  BP: 120/80  Pulse: 68  Temp: 97.9 F (36.6 C)  TempSrc: Oral  SpO2: 99%  Weight: 176 lb (79.8 kg)  Height: 5\' 7"  (1.702 m)      Assessment & Plan:  Flu shot and prevnar 13 given at visit

## 2016-12-13 NOTE — Patient Instructions (Signed)
We are checking the labs today.   We have given you the flu shot and the pneumonia shot.   Ask the pharmacist about getting the shingles shot in 2 weeks (at least). This is 2 shots so you get the 2nd shot about 2 months after the first one.  Health Maintenance, Female Adopting a healthy lifestyle and getting preventive care can go a long way to promote health and wellness. Talk with your health care provider about what schedule of regular examinations is right for you. This is a good chance for you to check in with your provider about disease prevention and staying healthy. In between checkups, there are plenty of things you can do on your own. Experts have done a lot of research about which lifestyle changes and preventive measures are most likely to keep you healthy. Ask your health care provider for more information. Weight and diet Eat a healthy diet  Be sure to include plenty of vegetables, fruits, low-fat dairy products, and lean protein.  Do not eat a lot of foods high in solid fats, added sugars, or salt.  Get regular exercise. This is one of the most important things you can do for your health. ? Most adults should exercise for at least 150 minutes each week. The exercise should increase your heart rate and make you sweat (moderate-intensity exercise). ? Most adults should also do strengthening exercises at least twice a week. This is in addition to the moderate-intensity exercise.  Maintain a healthy weight  Body mass index (BMI) is a measurement that can be used to identify possible weight problems. It estimates body fat based on height and weight. Your health care provider can help determine your BMI and help you achieve or maintain a healthy weight.  For females 28 years of age and older: ? A BMI below 18.5 is considered underweight. ? A BMI of 18.5 to 24.9 is normal. ? A BMI of 25 to 29.9 is considered overweight. ? A BMI of 30 and above is considered obese.  Watch levels  of cholesterol and blood lipids  You should start having your blood tested for lipids and cholesterol at 66 years of age, then have this test every 5 years.  You may need to have your cholesterol levels checked more often if: ? Your lipid or cholesterol levels are high. ? You are older than 66 years of age. ? You are at high risk for heart disease.  Cancer screening Lung Cancer  Lung cancer screening is recommended for adults 69-57 years old who are at high risk for lung cancer because of a history of smoking.  A yearly low-dose CT scan of the lungs is recommended for people who: ? Currently smoke. ? Have quit within the past 15 years. ? Have at least a 30-pack-year history of smoking. A pack year is smoking an average of one pack of cigarettes a day for 1 year.  Yearly screening should continue until it has been 15 years since you quit.  Yearly screening should stop if you develop a health problem that would prevent you from having lung cancer treatment.  Breast Cancer  Practice breast self-awareness. This means understanding how your breasts normally appear and feel.  It also means doing regular breast self-exams. Let your health care provider know about any changes, no matter how small.  If you are in your 20s or 30s, you should have a clinical breast exam (CBE) by a health care provider every 1-3 years as part  of a regular health exam.  If you are 71 or older, have a CBE every year. Also consider having a breast X-ray (mammogram) every year.  If you have a family history of breast cancer, talk to your health care provider about genetic screening.  If you are at high risk for breast cancer, talk to your health care provider about having an MRI and a mammogram every year.  Breast cancer gene (BRCA) assessment is recommended for women who have family members with BRCA-related cancers. BRCA-related cancers include: ? Breast. ? Ovarian. ? Tubal. ? Peritoneal  cancers.  Results of the assessment will determine the need for genetic counseling and BRCA1 and BRCA2 testing.  Cervical Cancer Your health care provider may recommend that you be screened regularly for cancer of the pelvic organs (ovaries, uterus, and vagina). This screening involves a pelvic examination, including checking for microscopic changes to the surface of your cervix (Pap test). You may be encouraged to have this screening done every 3 years, beginning at age 6.  For women ages 102-65, health care providers may recommend pelvic exams and Pap testing every 3 years, or they may recommend the Pap and pelvic exam, combined with testing for human papilloma virus (HPV), every 5 years. Some types of HPV increase your risk of cervical cancer. Testing for HPV may also be done on women of any age with unclear Pap test results.  Other health care providers may not recommend any screening for nonpregnant women who are considered low risk for pelvic cancer and who do not have symptoms. Ask your health care provider if a screening pelvic exam is right for you.  If you have had past treatment for cervical cancer or a condition that could lead to cancer, you need Pap tests and screening for cancer for at least 20 years after your treatment. If Pap tests have been discontinued, your risk factors (such as having a new sexual partner) need to be reassessed to determine if screening should resume. Some women have medical problems that increase the chance of getting cervical cancer. In these cases, your health care provider may recommend more frequent screening and Pap tests.  Colorectal Cancer  This type of cancer can be detected and often prevented.  Routine colorectal cancer screening usually begins at 66 years of age and continues through 66 years of age.  Your health care provider may recommend screening at an earlier age if you have risk factors for colon cancer.  Your health care provider may also  recommend using home test kits to check for hidden blood in the stool.  A small camera at the end of a tube can be used to examine your colon directly (sigmoidoscopy or colonoscopy). This is done to check for the earliest forms of colorectal cancer.  Routine screening usually begins at age 62.  Direct examination of the colon should be repeated every 5-10 years through 66 years of age. However, you may need to be screened more often if early forms of precancerous polyps or small growths are found.  Skin Cancer  Check your skin from head to toe regularly.  Tell your health care provider about any new moles or changes in moles, especially if there is a change in a mole's shape or color.  Also tell your health care provider if you have a mole that is larger than the size of a pencil eraser.  Always use sunscreen. Apply sunscreen liberally and repeatedly throughout the day.  Protect yourself by wearing long  sleeves, pants, a wide-brimmed hat, and sunglasses whenever you are outside.  Heart disease, diabetes, and high blood pressure  High blood pressure causes heart disease and increases the risk of stroke. High blood pressure is more likely to develop in: ? People who have blood pressure in the high end of the normal range (130-139/85-89 mm Hg). ? People who are overweight or obese. ? People who are African American.  If you are 13-18 years of age, have your blood pressure checked every 3-5 years. If you are 83 years of age or older, have your blood pressure checked every year. You should have your blood pressure measured twice-once when you are at a hospital or clinic, and once when you are not at a hospital or clinic. Record the average of the two measurements. To check your blood pressure when you are not at a hospital or clinic, you can use: ? An automated blood pressure machine at a pharmacy. ? A home blood pressure monitor.  If you are between 63 years and 64 years old, ask your  health care provider if you should take aspirin to prevent strokes.  Have regular diabetes screenings. This involves taking a blood sample to check your fasting blood sugar level. ? If you are at a normal weight and have a low risk for diabetes, have this test once every three years after 66 years of age. ? If you are overweight and have a high risk for diabetes, consider being tested at a younger age or more often. Preventing infection Hepatitis B  If you have a higher risk for hepatitis B, you should be screened for this virus. You are considered at high risk for hepatitis B if: ? You were born in a country where hepatitis B is common. Ask your health care provider which countries are considered high risk. ? Your parents were born in a high-risk country, and you have not been immunized against hepatitis B (hepatitis B vaccine). ? You have HIV or AIDS. ? You use needles to inject street drugs. ? You live with someone who has hepatitis B. ? You have had sex with someone who has hepatitis B. ? You get hemodialysis treatment. ? You take certain medicines for conditions, including cancer, organ transplantation, and autoimmune conditions.  Hepatitis C  Blood testing is recommended for: ? Everyone born from [birth year] through 1965. ? Anyone with known risk factors for hepatitis C.  Sexually transmitted infections (STIs)  You should be screened for sexually transmitted infections (STIs) including gonorrhea and chlamydia if: ? You are sexually active and are younger than 66 years of age. ? You are older than 66 years of age and your health care provider tells you that you are at risk for this type of infection. ? Your sexual activity has changed since you were last screened and you are at an increased risk for chlamydia or gonorrhea. Ask your health care provider if you are at risk.  If you do not have HIV, but are at risk, it may be recommended that you take a prescription medicine daily to  prevent HIV infection. This is called pre-exposure prophylaxis (PrEP). You are considered at risk if: ? You are sexually active and do not regularly use condoms or know the HIV status of your partner(s). ? You take drugs by injection. ? You are sexually active with a partner who has HIV.  Talk with your health care provider about whether you are at high risk of being infected with HIV. If  you choose to begin PrEP, you should first be tested for HIV. You should then be tested every 3 months for as long as you are taking PrEP. Pregnancy  If you are premenopausal and you may become pregnant, ask your health care provider about preconception counseling.  If you may become pregnant, take 400 to 800 micrograms (mcg) of folic acid every day.  If you want to prevent pregnancy, talk to your health care provider about birth control (contraception). Osteoporosis and menopause  Osteoporosis is a disease in which the bones lose minerals and strength with aging. This can result in serious bone fractures. Your risk for osteoporosis can be identified using a bone density scan.  If you are 19 years of age or older, or if you are at risk for osteoporosis and fractures, ask your health care provider if you should be screened.  Ask your health care provider whether you should take a calcium or vitamin D supplement to lower your risk for osteoporosis.  Menopause may have certain physical symptoms and risks.  Hormone replacement therapy may reduce some of these symptoms and risks. Talk to your health care provider about whether hormone replacement therapy is right for you. Follow these instructions at home:  Schedule regular health, dental, and eye exams.  Stay current with your immunizations.  Do not use any tobacco products including cigarettes, chewing tobacco, or electronic cigarettes.  If you are pregnant, do not drink alcohol.  If you are breastfeeding, limit how much and how often you drink  alcohol.  Limit alcohol intake to no more than 1 drink per day for nonpregnant women. One drink equals 12 ounces of beer, 5 ounces of wine, or 1 ounces of hard liquor.  Do not use street drugs.  Do not share needles.  Ask your health care provider for help if you need support or information about quitting drugs.  Tell your health care provider if you often feel depressed.  Tell your health care provider if you have ever been abused or do not feel safe at home. This information is not intended to replace advice given to you by your health care provider. Make sure you discuss any questions you have with your health care provider. Document Released: 09/12/2010 Document Revised: 08/05/2015 Document Reviewed: 12/01/2014 Elsevier Interactive Patient Education  [employer].

## 2016-12-15 NOTE — Assessment & Plan Note (Signed)
Checking TSH and free T4 and adjust synthroid 150 mcg daily as needed.  

## 2016-12-15 NOTE — Assessment & Plan Note (Signed)
BP at goal with losartan/hctz and checking CMP and adjust as needed.

## 2016-12-15 NOTE — Assessment & Plan Note (Signed)
Checking lipid panel and adjust simvastatin 20 mg daily as needed.  

## 2016-12-15 NOTE — Assessment & Plan Note (Signed)
Flu and prevnar 13 given at visit. Needs pneumonia 23 next year. Tetanus up to date. Colonoscopy and mammogram up to date. Counseled about sun safety and mole surveillance. Given 10 year screening recommendations.

## 2017-02-07 ENCOUNTER — Ambulatory Visit (INDEPENDENT_AMBULATORY_CARE_PROVIDER_SITE_OTHER): Payer: Medicare Other | Admitting: Internal Medicine

## 2017-02-07 ENCOUNTER — Encounter: Payer: Self-pay | Admitting: Internal Medicine

## 2017-02-07 DIAGNOSIS — B029 Zoster without complications: Secondary | ICD-10-CM | POA: Insufficient documentation

## 2017-02-07 MED ORDER — VALACYCLOVIR HCL 1 G PO TABS: 1000.0000 mg | ORAL_TABLET | Freq: Three times a day (TID) | ORAL | 0 refills | Status: DC

## 2017-02-07 MED ORDER — TRIAMCINOLONE ACETONIDE 0.1 % EX CREA: 1.0000 "application " | TOPICAL_CREAM | Freq: Two times a day (BID) | CUTANEOUS | 0 refills | Status: DC

## 2017-02-07 NOTE — Progress Notes (Signed)
   Subjective:    Patient ID: [name], female    DOB: 04-07-1950, 65 y.o.   MRN: [id number]  HPI The patient is a 66 YO female coming in for rash on her elbow. Started about 2 weeks ago with a blistering kind of rash. She thought it was an allergic reaction and used cortisone on it. This did not help much. Gradually started to dry up. She started getting some pain in the area about 3 days ago. Hurts stinging in the area and burning. Denies sick contacts, change in clothes, shampoo, cream, lotion.  Review of Systems  Constitutional: Negative.   Respiratory: Negative for cough, chest tightness and shortness of breath.   Cardiovascular: Negative for chest pain, palpitations and leg swelling.  Gastrointestinal: Negative for abdominal distention, abdominal pain, constipation, diarrhea, nausea and vomiting.  Musculoskeletal: Negative.   Skin: Positive for rash.  Neurological: Negative.   Psychiatric/Behavioral: Negative.       Objective:   Physical Exam  Constitutional: She is oriented to person, place, and time. She appears well-developed and well-nourished.  HENT:  Head: Normocephalic and atraumatic.  Eyes: EOM are normal.  Neck: Normal range of motion.  Cardiovascular: Normal rate and regular rhythm.  Pulmonary/Chest: Effort normal and breath sounds normal. No respiratory distress. She has no wheezes. She has no rales.  Abdominal: Soft. Bowel sounds are normal. She exhibits no distension. There is no tenderness. There is no rebound.  Musculoskeletal: She exhibits no edema.  Neurological: She is alert and oriented to person, place, and time. Coordination normal.  Skin: Skin is warm and dry. Rash noted.  Rash on the elbow, with some patches and mild blistering, no cellulitis or surrounding erythema.   Psychiatric: She has a normal mood and affect.   Vitals:   02/07/17 0900  BP: 122/82  Pulse: 74  Temp: 97.8 F (36.6 C)  TempSrc: Oral  SpO2: 99%  Weight: 178 lb (80.7 kg)    Height: 5\' 7"  (1.702 m)      Assessment & Plan:

## 2017-02-07 NOTE — Patient Instructions (Signed)
We have sent in the valtrex to take 1 pill 3 times a day for 1 week.  We have sent in a cream to use on the rash.

## 2017-02-07 NOTE — Assessment & Plan Note (Signed)
Rx for valtrex 1 g PO TID for 1 week and triamcinolone cream for the rash. Will use otc for pain.

## 2017-03-02 ENCOUNTER — Other Ambulatory Visit: Payer: Self-pay | Admitting: Internal Medicine

## 2017-04-16 ENCOUNTER — Other Ambulatory Visit: Payer: Self-pay | Admitting: Internal Medicine

## 2017-06-12 ENCOUNTER — Ambulatory Visit (INDEPENDENT_AMBULATORY_CARE_PROVIDER_SITE_OTHER): Payer: Medicare Other | Admitting: Internal Medicine

## 2017-06-12 ENCOUNTER — Other Ambulatory Visit (INDEPENDENT_AMBULATORY_CARE_PROVIDER_SITE_OTHER): Payer: Medicare Other

## 2017-06-12 ENCOUNTER — Encounter: Payer: Self-pay | Admitting: Internal Medicine

## 2017-06-12 VITALS — BP 118/82 | HR 82 | Temp 97.7°F | Ht 67.0 in | Wt 178.0 lb

## 2017-06-12 DIAGNOSIS — E039 Hypothyroidism, unspecified: Secondary | ICD-10-CM

## 2017-06-12 DIAGNOSIS — R0789 Other chest pain: Secondary | ICD-10-CM

## 2017-06-12 DIAGNOSIS — R350 Frequency of micturition: Secondary | ICD-10-CM

## 2017-06-12 LAB — COMPREHENSIVE METABOLIC PANEL
ALT: 10 U/L (ref 0–35)
AST: 16 U/L (ref 0–37)
Albumin: 3.8 g/dL (ref 3.5–5.2)
Alkaline Phosphatase: 71 U/L (ref 39–117)
BUN: 19 mg/dL (ref 6–23)
CO2: 29 mEq/L (ref 19–32)
Calcium: 9.2 mg/dL (ref 8.4–10.5)
Chloride: 107 mEq/L (ref 96–112)
Creatinine, Ser: 0.69 mg/dL (ref 0.40–1.20)
GFR: 109.07 mL/min (ref >60.00)
Glucose, Bld: 67 mg/dL — ABNORMAL LOW (ref 70–99)
Potassium: 3.8 mEq/L (ref 3.5–5.1)
Sodium: 141 mEq/L (ref 135–145)
Total Bilirubin: 0.5 mg/dL (ref 0.2–1.2)
Total Protein: 7.1 g/dL (ref 6.0–8.3)

## 2017-06-12 LAB — BRAIN NATRIURETIC PEPTIDE: Pro B Natriuretic peptide (BNP): 27 pg/mL (ref 0.0–100.0)

## 2017-06-12 LAB — CBC
HCT: 40.2 % (ref 36.0–46.0)
Hemoglobin: 13.7 g/dL (ref 12.0–15.0)
MCHC: 34 g/dL (ref 30.0–36.0)
MCV: 94.3 fl (ref 78.0–100.0)
Platelets: 244 10*3/uL (ref 150.0–400.0)
RBC: 4.26 Mil/uL (ref 3.87–5.11)
RDW: 15.4 % (ref 11.5–15.5)
WBC: 4.5 10*3/uL (ref 4.0–10.5)

## 2017-06-12 LAB — VITAMIN B12: Vitamin B-12: 726 pg/mL (ref 211–911)

## 2017-06-12 LAB — T4, FREE: Free T4: 0.87 ng/dL (ref 0.60–1.60)

## 2017-06-12 LAB — TROPONIN I: TNIDX: 0.01 ug/l (ref 0.00–0.06)

## 2017-06-12 LAB — TSH: TSH: 1.98 u[IU]/mL (ref 0.35–4.50)

## 2017-06-12 LAB — VITAMIN D 25 HYDROXY (VIT D DEFICIENCY, FRACTURES): VITD: 54.86 ng/mL (ref 30.00–100.00)

## 2017-06-12 NOTE — Assessment & Plan Note (Signed)
EKG done without changes today. Ordered stress test given rheumatologic disease and age as well as other risk factors she is moderate risk for CV disease. Checking troponin and bnp today as well for acute disease.

## 2017-06-12 NOTE — Assessment & Plan Note (Signed)
Checking TSH and free T4 given worsening symptoms which could be associated with thyroid.

## 2017-06-12 NOTE — Patient Instructions (Signed)
We will check the labs today and get you in for the stress test.   It is okay to go back on the trazodone.   Limit water after 7 pm and limit caffeine after 12pm to help with night time urination. You can chew on sugar free gum to help dry mouth.   Kegel Exercises Kegel exercises help strengthen the muscles that support the rectum, vagina, small intestine, bladder, and uterus. Doing Kegel exercises can help:  Improve bladder and bowel control.  Improve sexual response.  Reduce problems and discomfort during pregnancy.  Kegel exercises involve squeezing your pelvic floor muscles, which are the same muscles you squeeze when you try to stop the flow of urine. The exercises can be done while sitting, standing, or lying down, but it is best to vary your position. Phase 1 exercises 1. Squeeze your pelvic floor muscles tight. You should feel a tight lift in your rectal area. If you are a female, you should also feel a tightness in your vaginal area. Keep your stomach, buttocks, and legs relaxed. 2. Hold the muscles tight for up to 10 seconds. 3. Relax your muscles. Repeat this exercise 50 times a day or as many times as told by your health care provider. Continue to do this exercise for at least 4-6 weeks or for as long as told by your health care provider. This information is not intended to replace advice given to you by your health care provider. Make sure you discuss any questions you have with your health care provider. Document Released: 02/14/2012 Document Revised: 10/23/2015 Document Reviewed: 01/17/2015 Elsevier Interactive Patient Education  [employer].

## 2017-06-12 NOTE — Progress Notes (Signed)
   Subjective:    Patient ID: [name], female    DOB: 11-Feb-1951, 67 y.o.   MRN: [id number]  HPI The patient is a 67 YO female coming in for several concerns including chest pains (started about 2-3 weeks ago, in the middle of her chest and into the left side some, worse at night and she feels her heart beating at night time, able to walk up flight of stairs without stopping, this morning is SOB with even short distances, denies sweating, left arm or neck pain or jaw pain, no known history of CAD, no recent stress testing) and fatigue (she is not sure if her thyroid is out of sorts, no energy or motivation, thought her trazodone was the problem and did not take that last night, is sleeping okay otherwise, denies SI/HI) and urinary problem (is going more often, denies pain with urination, drinking more sodas and liquids lately, drinks right until bed in the evening, denies fevers or chills, denies stomach pain or tenderness).   Review of Systems  Constitutional: Positive for activity change and fatigue. Negative for chills, diaphoresis, fever and unexpected weight change.  HENT: Negative.   Eyes: Negative.   Respiratory: Positive for shortness of breath. Negative for cough and chest tightness.   Cardiovascular: Positive for chest pain and palpitations. Negative for leg swelling.  Gastrointestinal: Negative for abdominal distention, abdominal pain, constipation, diarrhea, nausea and vomiting.  Musculoskeletal: Negative for arthralgias, back pain, myalgias, neck pain and neck stiffness.  Skin: Negative.   Neurological: Negative for dizziness, seizures, facial asymmetry, speech difficulty, weakness and numbness.  Psychiatric/Behavioral: Negative.       Objective:   Physical Exam  Constitutional: She is oriented to person, place, and time. She appears well-developed and well-nourished.  HENT:  Head: Normocephalic and atraumatic.  Eyes: EOM are normal.  Neck: Normal range of motion.    Cardiovascular: Normal rate and regular rhythm.  Pulmonary/Chest: Effort normal and breath sounds normal. No respiratory distress. She has no wheezes. She has no rales. She exhibits no tenderness.  Abdominal: Soft. Bowel sounds are normal. She exhibits no distension. There is no tenderness. There is no rebound.  Musculoskeletal: She exhibits no edema.  Neurological: She is alert and oriented to person, place, and time. Coordination normal.  Skin: Skin is warm and dry.  Psychiatric: She has a normal mood and affect.   Vitals:   06/12/17 1003  BP: 118/82  Pulse: 82  Temp: 97.7 F (36.5 C)  TempSrc: Oral  SpO2: 99%  Weight: 178 lb (80.7 kg)  Height: 5\' 7"  (1.702 m)   EKG: Rate 70, axis unchanged, intervals normal, no st or t wave changes, no change when compared to 2017    Assessment & Plan:

## 2017-06-12 NOTE — Assessment & Plan Note (Signed)
Suspect due to increased caffeine and liquid intake. Advised to stop all caffeine after 12 pm and liquids after 7 pm to avoid nocturnal urination. Given kegel exercises to help improve pelvic tone. Return if no improvement.

## 2017-06-13 ENCOUNTER — Telehealth (HOSPITAL_COMMUNITY): Payer: Self-pay

## 2017-06-13 NOTE — Telephone Encounter (Signed)
Encounter complete. 

## 2017-06-14 ENCOUNTER — Ambulatory Visit (HOSPITAL_COMMUNITY)
Admission: RE | Admit: 2017-06-14 | Discharge: 2017-06-14 | Disposition: A | Discharge status: Home / Self Care | Payer: Medicare Other | Source: Ambulatory Visit | Attending: Internal Medicine | Admitting: Internal Medicine

## 2017-06-14 DIAGNOSIS — R0789 Other chest pain: Secondary | ICD-10-CM | POA: Insufficient documentation

## 2017-06-14 LAB — EXERCISE TOLERANCE TEST
Estimated workload: 10.3 METS
Exercise duration (min): 9 min
Exercise duration (sec): 9 s
MPHR: 153 {beats}/min
Peak HR: 144 {beats}/min
Percent HR: 94 %
RPE: 18
Rest HR: 75 {beats}/min

## 2017-06-27 ENCOUNTER — Other Ambulatory Visit: Payer: Self-pay | Admitting: Internal Medicine

## 2017-07-01 ENCOUNTER — Other Ambulatory Visit: Payer: Self-pay | Admitting: Internal Medicine

## 2017-07-03 ENCOUNTER — Telehealth: Payer: Self-pay | Admitting: Internal Medicine

## 2017-07-03 NOTE — Telephone Encounter (Signed)
Copied from [place] [id number]. Topic: Quick Communication - See Telephone Encounter >> Jul 03, 2017  2:26 PM Hewitt Shorts wrote: CRM for notification. See Telephone encounter for: 07/03/17.pt is calling stating that she still has a cough from her appt on 06/12/17 and would like to know if something can be called in [employer] and [employer] [phone]

## 2017-07-04 MED ORDER — BENZONATATE 200 MG PO CAPS: 200.0000 mg | ORAL_CAPSULE | Freq: Three times a day (TID) | ORAL | 0 refills | Status: DC | PRN

## 2017-07-04 NOTE — Telephone Encounter (Signed)
Patient states she is having an allergy flare- she is using her nasal spray and allergy medication. She has develop a cough and at night it is keeping her up. Patient states she has had a cough medication prescribed in the past and she would like that again. She is not coughing mucus up at this point- she is using hot tea and honey. No fever- some nasal congestion- but the nasal spray is helping that. Patient also wants to know if she can take an increased dose of her Clartin- told her to wait for instruction on that.

## 2017-07-04 NOTE — Telephone Encounter (Signed)
Can double up on claritin although she may have more luck with change to zyrtec. Have sent in tessalon perles that she can use up to 3 times per day.

## 2017-07-04 NOTE — Telephone Encounter (Signed)
Patient informed of MD response and stated understanding  

## 2017-07-15 ENCOUNTER — Other Ambulatory Visit: Payer: Self-pay | Admitting: Internal Medicine

## 2017-07-16 ENCOUNTER — Other Ambulatory Visit: Payer: Self-pay | Admitting: Internal Medicine

## 2017-07-16 NOTE — Telephone Encounter (Signed)
Refill sent.

## 2017-07-16 NOTE — Telephone Encounter (Signed)
Copied from [place] [id number]. Topic: Quick Communication - Rx Refill/Question >> Jul 16, 2017  4:27 PM Tye [place] wrote: Pt wants to know why medication was denied, call pt to advise  Medication: losartan-hydrochlorothiazide (HYZAAR) 50-12.5 MG tablet [[id number]]  Has the patient contacted their pharmacy? Yes.   (Agent: If no, request that the patient contact the pharmacy for the refill.) Preferred Pharmacy (with phone number or street name): [facility] Agent: Please be advised that RX refills may take up to 3 business days. We ask that you follow-up with your pharmacy.

## 2017-07-26 ENCOUNTER — Other Ambulatory Visit: Payer: Self-pay | Admitting: Internal Medicine

## 2017-07-26 NOTE — Telephone Encounter (Signed)
LOV 06/12/17 Dr. Sharlet Salina Last refill 05/03/15 Pt. States she uses these for swimmer's ear

## 2017-07-26 NOTE — Telephone Encounter (Signed)
Copied from [place] [id number]. Topic: Quick Communication - See Telephone Encounter >> Jul 26, 2017 11:58 AM Conception Chancy, NT wrote: CRM for notification. See Telephone encounter for: 07/26/17.  Patient is calling and requesting a refill on NEOMYCIN-POLYMYXIN-HYDROCORTISONE (CORTISPORIN) 1 % SOLN otic solution. Please advise.   [facility] [place], [address] [place] [address] Phone: [phone] Fax: [phone]

## 2017-07-27 MED ORDER — NEOMYCIN-POLYMYXIN-HC 1 % OT SOLN: 3.0000 [drp] | Freq: Three times a day (TID) | OTIC | 0 refills | Status: DC

## 2017-09-03 ENCOUNTER — Other Ambulatory Visit: Payer: Self-pay | Admitting: Internal Medicine

## 2017-09-03 DIAGNOSIS — Z1231 Encounter for screening mammogram for malignant neoplasm of breast: Secondary | ICD-10-CM

## 2017-09-24 ENCOUNTER — Other Ambulatory Visit: Payer: Self-pay | Admitting: Internal Medicine

## 2017-09-29 ENCOUNTER — Other Ambulatory Visit: Payer: Self-pay | Admitting: Internal Medicine

## 2017-10-01 ENCOUNTER — Other Ambulatory Visit: Payer: Self-pay | Admitting: Internal Medicine

## 2017-10-08 ENCOUNTER — Ambulatory Visit
Admission: RE | Admit: 2017-10-08 | Discharge: 2017-10-08 | Disposition: A | Discharge status: Home / Self Care | Payer: Medicare Other | Source: Ambulatory Visit | Attending: Internal Medicine | Admitting: Internal Medicine

## 2017-10-08 DIAGNOSIS — Z1231 Encounter for screening mammogram for malignant neoplasm of breast: Secondary | ICD-10-CM

## 2017-10-19 ENCOUNTER — Other Ambulatory Visit: Payer: Self-pay | Admitting: Internal Medicine

## 2017-12-17 ENCOUNTER — Ambulatory Visit (INDEPENDENT_AMBULATORY_CARE_PROVIDER_SITE_OTHER): Payer: Medicare Other | Admitting: Internal Medicine

## 2017-12-17 ENCOUNTER — Encounter: Payer: Self-pay | Admitting: Internal Medicine

## 2017-12-17 ENCOUNTER — Other Ambulatory Visit (INDEPENDENT_AMBULATORY_CARE_PROVIDER_SITE_OTHER): Payer: Medicare Other

## 2017-12-17 VITALS — BP 122/82 | HR 67 | Temp 97.9°F | Ht 67.0 in | Wt 181.0 lb

## 2017-12-17 DIAGNOSIS — E785 Hyperlipidemia, unspecified: Secondary | ICD-10-CM | POA: Diagnosis not present

## 2017-12-17 DIAGNOSIS — Z0000 Encounter for general adult medical examination without abnormal findings: Secondary | ICD-10-CM

## 2017-12-17 DIAGNOSIS — Z23 Encounter for immunization: Secondary | ICD-10-CM

## 2017-12-17 DIAGNOSIS — I10 Essential (primary) hypertension: Secondary | ICD-10-CM

## 2017-12-17 DIAGNOSIS — F3340 Major depressive disorder, recurrent, in remission, unspecified: Secondary | ICD-10-CM

## 2017-12-17 DIAGNOSIS — E039 Hypothyroidism, unspecified: Secondary | ICD-10-CM

## 2017-12-17 LAB — COMPREHENSIVE METABOLIC PANEL
ALT: 11 U/L (ref 0–35)
AST: 17 U/L (ref 0–37)
Albumin: 3.9 g/dL (ref 3.5–5.2)
Alkaline Phosphatase: 67 U/L (ref 39–117)
BUN: 18 mg/dL (ref 6–23)
CO2: 29 mEq/L (ref 19–32)
Calcium: 9.3 mg/dL (ref 8.4–10.5)
Chloride: 106 mEq/L (ref 96–112)
Creatinine, Ser: 0.79 mg/dL (ref 0.40–1.20)
GFR: 93.16 mL/min (ref >60.00)
Glucose, Bld: 103 mg/dL — ABNORMAL HIGH (ref 70–99)
Potassium: 4 mEq/L (ref 3.5–5.1)
Sodium: 140 mEq/L (ref 135–145)
Total Bilirubin: 0.4 mg/dL (ref 0.2–1.2)
Total Protein: 7.2 g/dL (ref 6.0–8.3)

## 2017-12-17 LAB — LIPID PANEL
Cholesterol: 162 mg/dL (ref 0–200)
HDL: 58.9 mg/dL (ref >39.00)
LDL Cholesterol: 93 mg/dL (ref 0–99)
NonHDL: 103.41
Total CHOL/HDL Ratio: 3
Triglycerides: 50 mg/dL (ref 0.0–149.0)
VLDL: 10 mg/dL (ref 0.0–40.0)

## 2017-12-17 LAB — CBC
HCT: 40 % (ref 36.0–46.0)
Hemoglobin: 13.5 g/dL (ref 12.0–15.0)
MCHC: 33.7 g/dL (ref 30.0–36.0)
MCV: 92.4 fl (ref 78.0–100.0)
Platelets: 243 10*3/uL (ref 150.0–400.0)
RBC: 4.33 Mil/uL (ref 3.87–5.11)
RDW: 14.8 % (ref 11.5–15.5)
WBC: 4.7 10*3/uL (ref 4.0–10.5)

## 2017-12-17 LAB — T4, FREE: Free T4: 0.94 ng/dL (ref 0.60–1.60)

## 2017-12-17 LAB — TSH: TSH: 1.04 u[IU]/mL (ref 0.35–4.50)

## 2017-12-17 LAB — HEMOGLOBIN A1C: Hgb A1c MFr Bld: 5.6 % (ref 4.6–6.5)

## 2017-12-17 MED ORDER — TRAZODONE HCL 50 MG PO TABS: ORAL_TABLET | ORAL | 2 refills | Status: DC

## 2017-12-17 NOTE — Patient Instructions (Signed)

## 2017-12-17 NOTE — Progress Notes (Signed)
   Subjective:    Patient ID: [name], female    DOB: 02-14-1951, 67 y.o.   MRN: [id number]  HPI Here for medicare wellness and physical, no new complaints. Please see A/P for status and treatment of chronic medical problems.   Diet: heart healthy Physical activity: sedentary Depression/mood screen: negative Hearing: intact to whispered voice Visual acuity: grossly normal, performs annual eye exam  ADLs: capable Fall risk: none Home safety: good Cognitive evaluation: intact to orientation, naming, recall and repetition EOL planning: adv directives discussed  I have personally reviewed and have noted 1. The patient's medical and social history - reviewed today no changes 2. Their use of alcohol, tobacco or illicit drugs 3. Their current medications and supplements 4. The patient's functional ability including ADL's, fall risks, home safety risks and hearing or visual impairment. 5. Diet and physical activities 6. Evidence for depression or mood disorders 7. Care team reviewed and updated (available in snapshot)   Review of Systems  Constitutional: Negative.   HENT: Negative.   Eyes: Negative.   Respiratory: Negative for cough, chest tightness and shortness of breath.   Cardiovascular: Negative for chest pain, palpitations and leg swelling.  Gastrointestinal: Negative for abdominal distention, abdominal pain, constipation, diarrhea, nausea and vomiting.  Musculoskeletal: Negative.   Skin: Negative.   Neurological: Negative.   Psychiatric/Behavioral: Negative.       Objective:   Physical Exam  Constitutional: She is oriented to person, place, and time. She appears well-developed and well-nourished.  HENT:  Head: Normocephalic and atraumatic.  Eyes: EOM are normal.  Neck: Normal range of motion.  Cardiovascular: Normal rate and regular rhythm.  Pulmonary/Chest: Effort normal and breath sounds normal. No respiratory distress. She has no wheezes. She has no rales.    Abdominal: Soft. Bowel sounds are normal. She exhibits no distension. There is no tenderness. There is no rebound.  Musculoskeletal: She exhibits no edema.  Neurological: She is alert and oriented to person, place, and time. Coordination normal.  Skin: Skin is warm and dry.  Psychiatric: She has a normal mood and affect.   Vitals:   12/17/17 1047  BP: 122/82  Pulse: 67  Temp: 97.9 F (36.6 C)  TempSrc: Oral  SpO2: 97%  Weight: 181 lb (82.1 kg)  Height: 5\' 7"  (1.702 m)      Assessment & Plan:  Pneumonia 23 given at visit

## 2017-12-18 NOTE — Assessment & Plan Note (Signed)
BP at goal on losartan/hctz and checking CMP and adjust as needed.  

## 2017-12-18 NOTE — Assessment & Plan Note (Signed)
Checking lipid panel and adjust simvastatin 20 mg daily as needed.  

## 2017-12-18 NOTE — Assessment & Plan Note (Signed)
Still taking cymbalta with good control. Denies worsening symptoms or SI/HI. Continue at this time.

## 2017-12-18 NOTE — Assessment & Plan Note (Signed)
Checking TSH and free T4 and adjust synthroid 100 mcg daily as needed.  

## 2017-12-18 NOTE — Assessment & Plan Note (Signed)
Flu shot up to date. Pneumonia given 23 to complete series. Shingrix counseled. Tetanus up to date. Colonoscopy due 2020. Mammogram up to date, pap smear not indicated and dexa up to date. Counseled about sun safety and mole surveillance. Counseled about the dangers of distracted driving. Given 10 year screening recommendations.

## 2017-12-24 ENCOUNTER — Other Ambulatory Visit: Payer: Self-pay | Admitting: Internal Medicine

## 2018-03-18 ENCOUNTER — Other Ambulatory Visit: Payer: Self-pay | Admitting: Internal Medicine

## 2018-03-25 DIAGNOSIS — M25561 Pain in right knee: Secondary | ICD-10-CM | POA: Insufficient documentation

## 2018-04-28 ENCOUNTER — Other Ambulatory Visit: Payer: Self-pay | Admitting: Internal Medicine

## 2018-06-18 ENCOUNTER — Other Ambulatory Visit: Payer: Self-pay | Admitting: Internal Medicine

## 2018-06-20 ENCOUNTER — Other Ambulatory Visit: Payer: Self-pay

## 2018-06-20 ENCOUNTER — Other Ambulatory Visit: Payer: Self-pay | Admitting: Internal Medicine

## 2018-06-20 MED ORDER — HYDROCHLOROTHIAZIDE 12.5 MG PO TABS: 12.5000 mg | ORAL_TABLET | Freq: Every day | ORAL | 1 refills | Status: DC

## 2018-06-20 MED ORDER — LOSARTAN POTASSIUM 50 MG PO TABS: 50.0000 mg | ORAL_TABLET | Freq: Every day | ORAL | 1 refills | Status: DC

## 2018-09-02 ENCOUNTER — Other Ambulatory Visit: Payer: Self-pay | Admitting: *Deleted

## 2018-09-02 DIAGNOSIS — Z20822 Contact with and (suspected) exposure to covid-19: Secondary | ICD-10-CM

## 2018-09-08 LAB — NOVEL CORONAVIRUS, NAA: SARS-CoV-2, NAA: NOT DETECTED

## 2018-09-30 ENCOUNTER — Other Ambulatory Visit: Payer: Self-pay | Admitting: Internal Medicine

## 2018-09-30 DIAGNOSIS — Z1231 Encounter for screening mammogram for malignant neoplasm of breast: Secondary | ICD-10-CM

## 2018-10-01 ENCOUNTER — Ambulatory Visit (INDEPENDENT_AMBULATORY_CARE_PROVIDER_SITE_OTHER): Payer: Medicare Other | Admitting: Family

## 2018-10-01 ENCOUNTER — Other Ambulatory Visit: Payer: Self-pay

## 2018-10-01 ENCOUNTER — Ambulatory Visit (INDEPENDENT_AMBULATORY_CARE_PROVIDER_SITE_OTHER)
Admission: RE | Admit: 2018-10-01 | Discharge: 2018-10-01 | Disposition: A | Discharge status: Home / Self Care | Payer: Medicare Other | Source: Ambulatory Visit | Attending: Family | Admitting: Family

## 2018-10-01 ENCOUNTER — Other Ambulatory Visit: Payer: Self-pay | Admitting: Family

## 2018-10-01 ENCOUNTER — Encounter: Payer: Self-pay | Admitting: Family

## 2018-10-01 VITALS — BP 128/76 | HR 76 | Temp 97.9°F | Ht 67.0 in | Wt 184.0 lb

## 2018-10-01 DIAGNOSIS — M5442 Lumbago with sciatica, left side: Secondary | ICD-10-CM | POA: Diagnosis not present

## 2018-10-01 MED ORDER — METHYLPREDNISOLONE 4 MG PO TBPK: ORAL_TABLET | ORAL | 0 refills | Status: DC

## 2018-10-01 MED ORDER — NEOMYCIN-POLYMYXIN-HC 1 % OT SOLN: 3.0000 [drp] | Freq: Three times a day (TID) | OTIC | 0 refills | Status: DC

## 2018-10-01 NOTE — Progress Notes (Signed)
[name] is a 68 y.o. female with the following history as recorded in [facility]:  Patient Active Problem List   Diagnosis Date Noted  . Routine general medical examination at a health care facility 03/07/2016  . Lumbar back pain 06/29/2015  . Essential hypertension 10/31/2014  . Lipoma of arm 10/31/2014  . MDD (recurrent major depressive disorder) in remission ([place]) 10/31/2014  . Hypothyroidism 10/31/2014  . Hyperlipidemia 10/31/2014    Current Outpatient Medications  Medication Sig Dispense Refill  . B Complex-C (SUPER B COMPLEX PO) Take by mouth.    . clobetasol cream (TEMOVATE) 0.05 % APPLY EXTERNALLY TO THE AFFECTED AREA TWICE DAILY 30 g 0  . docusate sodium (COLACE) 100 MG capsule Take 100 mg by mouth daily.    . DULoxetine (CYMBALTA) 60 MG capsule TAKE 1 CAPSULE(60 MG) BY MOUTH DAILY 90 capsule 1  . fluticasone (FLONASE) 50 MCG/ACT nasal spray SHAKE LIQUID AND USE 2 SPRAYS IN EACH NOSTRIL DAILY 16 g 3  . hydrochlorothiazide (HYDRODIURIL) 12.5 MG tablet Take 1 tablet (12.5 mg total) by mouth daily. 90 tablet 1  . loratadine (CLARITIN) 10 MG tablet Take 1 tablet (10 mg total) by mouth daily. 30 tablet 3  . losartan (COZAAR) 50 MG tablet Take 1 tablet (50 mg total) by mouth daily. 90 tablet 1  . losartan-hydrochlorothiazide (HYZAAR) 50-12.5 MG tablet Take 1 tablet by mouth daily. 90 tablet 2  . methotrexate (RHEUMATREX) 2.5 MG tablet TK 4 TS PO Q SUNDAY AND MONDAY  0  . Multiple Vitamins-Minerals (MULTIVITAMIN WITH MINERALS) tablet Take 1 tablet by mouth daily.    . NEOMYCIN-POLYMYXIN-HYDROCORTISONE (CORTISPORIN) 1 % SOLN OTIC solution Place 3 drops into the left ear 3 (three) times daily. 10 mL 0  . Omega-3 Fatty Acids (FISH OIL BURP-LESS PO) Take by mouth.    . simvastatin (ZOCOR) 20 MG tablet TAKE 1 TABLET(20 MG) BY MOUTH DAILY 90 tablet 2  . SYNTHROID 100 MCG tablet Take 100 mcg by mouth daily.  6  . traZODone (DESYREL) 50 MG tablet TAKE 1/2 TO 2 TABLETS BY MOUTH AT BEDTIME  AS NEEDED FOR SLEEP 90 tablet 2  . triamcinolone cream (KENALOG) 0.1 % Apply 1 application topically 2 (two) times daily. 30 g 0   No current facility-administered medications for this visit.     Allergies: Patient has no known allergies.  Past Medical History:  Diagnosis Date  . Arthritis   . Blood in stool   . Colon polyps   . Hyperlipidemia   . Hypertension   . Thyroid disease     Past Surgical History:  Procedure Laterality Date  . ABDOMINAL HYSTERECTOMY    . KNEE SURGERY Left 2018    Family History  Problem Relation Age of Onset  . Hypertension Mother   . Arthritis Sister   . Cancer Sister        breast and colon  . Diabetes Sister   . Diabetes Paternal Aunt   . Hypertension Paternal Grandmother     Social History   Tobacco Use  . Smoking status: Never Smoker  . Smokeless tobacco: Never Used  Substance Use Topics  . Alcohol use: No    Alcohol/week: 0.0 standard drinks    Subjective:  Patient presents with concerns for 2 week history of low back pain/ radiating into left lower leg; no known injury or trauma; no prior history of sciatica or chronic low back pain; does have chronic right knee pain- knows she needs some type of  surgical treatment; not taking any medications for symptoms relief;      Objective:  Vitals:   10/01/18 0925  BP: 128/76  Pulse: 76  Temp: 97.9 F (36.6 C)  TempSrc: Oral  SpO2: 96%  Weight: 184 lb (83.5 kg)  Height: 5\' 7"  (1.702 m)    General: Well developed, well nourished, in no acute distress  Skin : Warm and dry.  Head: Normocephalic and atraumatic  Lungs: Respirations unlabored;  Musculoskeletal: No deformities; no active joint inflammation  Extremities: No edema, cyanosis, clubbing  Vessels: Symmetric bilaterally  Neurologic: Alert and oriented; speech intact; face symmetrical; moves all extremities well; CNII-XII intact without focal deficit  Assessment:  1. Acute left-sided low back pain with left-sided sciatica      Plan:  Update X-ray today; Rx for Medrol Dose Pak- take as directed; will most likely have patient plan to see her orthopedist in follow-up.    No follow-ups on file.  Orders Placed This Encounter  Procedures  . DG Lumbar Spine 2-3 Views    Standing Status:   Future    Standing Expiration Date:   12/02/2019    Order Specific Question:   Reason for Exam (SYMPTOM  OR DIAGNOSIS REQUIRED)    Answer:   low back pain    Order Specific Question:   Preferred imaging location?    Answer:   [facility]    Order Specific Question:   Radiology Contrast Protocol - do NOT remove file path    Answer:   [email or web address]  . DG Hip Unilat W OR W/O Pelvis 1V Left    Standing Status:   Future    Standing Expiration Date:   12/02/2019    Order Specific Question:   Reason for Exam (SYMPTOM  OR DIAGNOSIS REQUIRED)    Answer:   left hip pain    Order Specific Question:   Preferred imaging location?    Answer:   [facility]    Order Specific Question:   Radiology Contrast Protocol - do NOT remove file path    Answer:   \\[email or web address]    Requested Prescriptions    No prescriptions requested or ordered in this encounter

## 2018-11-15 ENCOUNTER — Other Ambulatory Visit: Payer: Self-pay

## 2018-11-15 ENCOUNTER — Ambulatory Visit
Admission: RE | Admit: 2018-11-15 | Discharge: 2018-11-15 | Disposition: A | Discharge status: Home / Self Care | Payer: Medicare Other | Source: Ambulatory Visit | Attending: Internal Medicine | Admitting: Internal Medicine

## 2018-11-15 DIAGNOSIS — Z1231 Encounter for screening mammogram for malignant neoplasm of breast: Secondary | ICD-10-CM

## 2018-12-15 ENCOUNTER — Other Ambulatory Visit: Payer: Self-pay | Admitting: Internal Medicine

## 2018-12-23 ENCOUNTER — Other Ambulatory Visit: Payer: Self-pay | Admitting: Internal Medicine

## 2018-12-24 ENCOUNTER — Encounter: Payer: Self-pay | Admitting: Internal Medicine

## 2018-12-24 ENCOUNTER — Other Ambulatory Visit (INDEPENDENT_AMBULATORY_CARE_PROVIDER_SITE_OTHER): Payer: Medicare Other

## 2018-12-24 ENCOUNTER — Ambulatory Visit (INDEPENDENT_AMBULATORY_CARE_PROVIDER_SITE_OTHER): Payer: Medicare Other | Admitting: Internal Medicine

## 2018-12-24 ENCOUNTER — Other Ambulatory Visit: Payer: Self-pay

## 2018-12-24 VITALS — BP 118/82 | HR 89 | Temp 97.5°F | Ht 67.0 in | Wt 181.0 lb

## 2018-12-24 DIAGNOSIS — Z0000 Encounter for general adult medical examination without abnormal findings: Secondary | ICD-10-CM

## 2018-12-24 DIAGNOSIS — E039 Hypothyroidism, unspecified: Secondary | ICD-10-CM

## 2018-12-24 DIAGNOSIS — E782 Mixed hyperlipidemia: Secondary | ICD-10-CM | POA: Diagnosis not present

## 2018-12-24 DIAGNOSIS — F3340 Major depressive disorder, recurrent, in remission, unspecified: Secondary | ICD-10-CM | POA: Diagnosis not present

## 2018-12-24 DIAGNOSIS — I10 Essential (primary) hypertension: Secondary | ICD-10-CM

## 2018-12-24 LAB — TSH: TSH: 1.47 u[IU]/mL (ref 0.35–4.50)

## 2018-12-24 LAB — COMPREHENSIVE METABOLIC PANEL
ALT: 10 U/L (ref 0–35)
AST: 16 U/L (ref 0–37)
Albumin: 4.1 g/dL (ref 3.5–5.2)
Alkaline Phosphatase: 75 U/L (ref 39–117)
BUN: 17 mg/dL (ref 6–23)
CO2: 27 mEq/L (ref 19–32)
Calcium: 9.8 mg/dL (ref 8.4–10.5)
Chloride: 104 mEq/L (ref 96–112)
Creatinine, Ser: 0.78 mg/dL (ref 0.40–1.20)
GFR: 88.68 mL/min (ref >60.00)
Glucose, Bld: 103 mg/dL — ABNORMAL HIGH (ref 70–99)
Potassium: 3.9 mEq/L (ref 3.5–5.1)
Sodium: 140 mEq/L (ref 135–145)
Total Bilirubin: 0.5 mg/dL (ref 0.2–1.2)
Total Protein: 7.5 g/dL (ref 6.0–8.3)

## 2018-12-24 LAB — LIPID PANEL
Cholesterol: 183 mg/dL (ref 0–200)
HDL: 63.9 mg/dL (ref >39.00)
LDL Cholesterol: 109 mg/dL — ABNORMAL HIGH (ref 0–99)
NonHDL: 118.63
Total CHOL/HDL Ratio: 3
Triglycerides: 47 mg/dL (ref 0.0–149.0)
VLDL: 9.4 mg/dL (ref 0.0–40.0)

## 2018-12-24 LAB — T4, FREE: Free T4: 1.17 ng/dL (ref 0.60–1.60)

## 2018-12-24 LAB — HEMOGLOBIN A1C: Hgb A1c MFr Bld: 5.8 % (ref 4.6–6.5)

## 2018-12-24 LAB — CBC
HCT: 42 % (ref 36.0–46.0)
Hemoglobin: 14.1 g/dL (ref 12.0–15.0)
MCHC: 33.6 g/dL (ref 30.0–36.0)
MCV: 90.8 fl (ref 78.0–100.0)
Platelets: 243 10*3/uL (ref 150.0–400.0)
RBC: 4.62 Mil/uL (ref 3.87–5.11)
RDW: 14.1 % (ref 11.5–15.5)
WBC: 5.4 10*3/uL (ref 4.0–10.5)

## 2018-12-24 NOTE — Assessment & Plan Note (Signed)
BP at goal on hctz and losartan. Checking CMP and adjust as needed.

## 2018-12-24 NOTE — Assessment & Plan Note (Signed)
Checking lipid panel and adjust simvastatin 20 mg daily as needed.  

## 2018-12-24 NOTE — Progress Notes (Signed)
   Subjective:   Patient ID: [name], female    DOB: 1950-04-04, 68 y.o.   MRN: [id number]  HPI Here for medicare wellness and physical, no new complaints. Please see A/P for status and treatment of chronic medical problems.   Diet: heart healthy Physical activity: sedentary, minimal exercise lately Depression/mood screen: negative Hearing: intact to whispered voice Visual acuity: grossly normal, performs annual eye exam  ADLs: capable Fall risk: none Home safety: good Cognitive evaluation: intact to orientation, naming, recall and repetition EOL planning: adv directives discussed    Office Visit from 12/24/2018 in [place]  PHQ-2 Total Score  0        Office Visit from 12/24/2018 in [place]  PHQ-9 Total Score  1    I have personally reviewed and have noted 1. The patient's medical and social history - reviewed today no changes 2. Their use of alcohol, tobacco or illicit drugs 3. Their current medications and supplements 4. The patient's functional ability including ADL's, fall risks, home safety risks and hearing or visual impairment. 5. Diet and physical activities 6. Evidence for depression or mood disorders 7. Care team reviewed and updated  Patient Care Team: Hoyt Koch, MD as PCP - General (Internal Medicine) Past Medical History:  Diagnosis Date  . Arthritis   . Blood in stool   . Colon polyps   . Hyperlipidemia   . Hypertension   . Thyroid disease    Past Surgical History:  Procedure Laterality Date  . ABDOMINAL HYSTERECTOMY    . KNEE SURGERY Left 2018   Family History  Problem Relation Age of Onset  . Hypertension Mother   . Arthritis Sister   . Cancer Sister        breast and colon  . Diabetes Sister   . Diabetes Paternal Aunt   . Hypertension Paternal Grandmother     Review of Systems  Constitutional: Negative.   HENT: Negative.   Eyes: Negative.   Respiratory:  Negative for cough, chest tightness and shortness of breath.   Cardiovascular: Negative for chest pain, palpitations and leg swelling.  Gastrointestinal: Negative for abdominal distention, abdominal pain, constipation, diarrhea, nausea and vomiting.  Musculoskeletal: Negative.   Skin: Negative.   Neurological: Negative.   Psychiatric/Behavioral: Negative.     Objective:  Physical Exam Constitutional:      Appearance: She is well-developed.  HENT:     Head: Normocephalic and atraumatic.  Neck:     Musculoskeletal: Normal range of motion.  Cardiovascular:     Rate and Rhythm: Normal rate and regular rhythm.  Pulmonary:     Effort: Pulmonary effort is normal. No respiratory distress.     Breath sounds: Normal breath sounds. No wheezing or rales.  Abdominal:     General: Bowel sounds are normal. There is no distension.     Palpations: Abdomen is soft.     Tenderness: There is no abdominal tenderness. There is no rebound.  Skin:    General: Skin is warm and dry.  Neurological:     Mental Status: She is alert and oriented to person, place, and time.     Coordination: Coordination normal.     Vitals:   12/24/18 1014  BP: 118/82  Pulse: 89  Temp: (!) 97.5 F (36.4 C)  TempSrc: Oral  SpO2: 97%  Weight: 181 lb (82.1 kg)  Height: 5\' 7"  (1.702 m)    Assessment & Plan:

## 2018-12-24 NOTE — Patient Instructions (Addendum)
Ask the pharmacist about the cost of the shingrix which is the shingles vaccine. You get 2 shots about 2-3 months apart to give you extra protection against getting shingles again.   Health Maintenance, Female Adopting a healthy lifestyle and getting preventive care are important in promoting health and wellness. Ask your health care provider about:  The right schedule for you to have regular tests and exams.  Things you can do on your own to prevent diseases and keep yourself healthy. What should I know about diet, weight, and exercise? Eat a healthy diet   Eat a diet that includes plenty of vegetables, fruits, low-fat dairy products, and lean protein.  Do not eat a lot of foods that are high in solid fats, added sugars, or sodium. Maintain a healthy weight Body mass index (BMI) is used to identify weight problems. It estimates body fat based on height and weight. Your health care provider can help determine your BMI and help you achieve or maintain a healthy weight. Get regular exercise Get regular exercise. This is one of the most important things you can do for your health. Most adults should:  Exercise for at least 150 minutes each week. The exercise should increase your heart rate and make you sweat (moderate-intensity exercise).  Do strengthening exercises at least twice a week. This is in addition to the moderate-intensity exercise.  Spend less time sitting. Even light physical activity can be beneficial. Watch cholesterol and blood lipids Have your blood tested for lipids and cholesterol at 68 years of age, then have this test every 5 years. Have your cholesterol levels checked more often if:  Your lipid or cholesterol levels are high.  You are older than 68 years of age.  You are at high risk for heart disease. What should I know about cancer screening? Depending on your health history and family history, you may need to have cancer screening at various ages. This may  include screening for:  Breast cancer.  Cervical cancer.  Colorectal cancer.  Skin cancer.  Lung cancer. What should I know about heart disease, diabetes, and high blood pressure? Blood pressure and heart disease  High blood pressure causes heart disease and increases the risk of stroke. This is more likely to develop in people who have high blood pressure readings, are of African descent, or are overweight.  Have your blood pressure checked: ? Every 3-5 years if you are 10-57 years of age. ? Every year if you are 89 years old or older. Diabetes Have regular diabetes screenings. This checks your fasting blood sugar level. Have the screening done:  Once every three years after age 10 if you are at a normal weight and have a low risk for diabetes.  More often and at a younger age if you are overweight or have a high risk for diabetes. What should I know about preventing infection? Hepatitis B If you have a higher risk for hepatitis B, you should be screened for this virus. Talk with your health care provider to find out if you are at risk for hepatitis B infection. Hepatitis C Testing is recommended for:  Everyone born from [birth year] through 1965.  Anyone with known risk factors for hepatitis C. Sexually transmitted infections (STIs)  Get screened for STIs, including gonorrhea and chlamydia, if: ? You are sexually active and are younger than 68 years of age. ? You are older than 68 years of age and your health care provider tells you that you are at  risk for this type of infection. ? Your sexual activity has changed since you were last screened, and you are at increased risk for chlamydia or gonorrhea. Ask your health care provider if you are at risk.  Ask your health care provider about whether you are at high risk for HIV. Your health care provider may recommend a prescription medicine to help prevent HIV infection. If you choose to take medicine to prevent HIV, you should first  get tested for HIV. You should then be tested every 3 months for as long as you are taking the medicine. Pregnancy  If you are about to stop having your period (premenopausal) and you may become pregnant, seek counseling before you get pregnant.  Take 400 to 800 micrograms (mcg) of folic acid every day if you become pregnant.  Ask for birth control (contraception) if you want to prevent pregnancy. Osteoporosis and menopause Osteoporosis is a disease in which the bones lose minerals and strength with aging. This can result in bone fractures. If you are 72 years old or older, or if you are at risk for osteoporosis and fractures, ask your health care provider if you should:  Be screened for bone loss.  Take a calcium or vitamin D supplement to lower your risk of fractures.  Be given hormone replacement therapy (HRT) to treat symptoms of menopause. Follow these instructions at home: Lifestyle  Do not use any products that contain nicotine or tobacco, such as cigarettes, e-cigarettes, and chewing tobacco. If you need help quitting, ask your health care provider.  Do not use street drugs.  Do not share needles.  Ask your health care provider for help if you need support or information about quitting drugs. Alcohol use  Do not drink alcohol if: ? Your health care provider tells you not to drink. ? You are pregnant, may be pregnant, or are planning to become pregnant.  If you drink alcohol: ? Limit how much you use to 0-1 drink a day. ? Limit intake if you are breastfeeding.  Be aware of how much alcohol is in your drink. In the [place] one drink equals one 12 oz bottle of beer (355 mL), one 5 oz glass of wine (148 mL), or one 1 oz glass of hard liquor (44 mL). General instructions  Schedule regular health, dental, and eye exams.  Stay current with your vaccines.  Tell your health care provider if: ? You often feel depressed. ? You have ever been abused or do not feel safe at  home. Summary  Adopting a healthy lifestyle and getting preventive care are important in promoting health and wellness.  Follow your health care provider's instructions about healthy diet, exercising, and getting tested or screened for diseases.  Follow your health care provider's instructions on monitoring your cholesterol and blood pressure. This information is not intended to replace advice given to you by your health care provider. Make sure you discuss any questions you have with your health care provider. Document Released: 09/12/2010 Document Revised: 02/20/2018 Document Reviewed: 02/20/2018 Elsevier Patient Education  2020 [employer].

## 2018-12-24 NOTE — Assessment & Plan Note (Signed)
Checking TSH and free T4 and adjust synthroid 100 mcg daily as needed.  

## 2018-12-24 NOTE — Assessment & Plan Note (Signed)
Flu shot complete for season. Pneumonia complete. Shingrix counseled. Tetanus due 2023. Colonoscopy now but patient states she has had more recent one than in our records so getting records. Mammogram due 2022, pap smear aged out and dexa declines further. Counseled about sun safety and mole surveillance. Counseled about the dangers of distracted driving. Given 10 year screening recommendations.

## 2018-12-24 NOTE — Assessment & Plan Note (Signed)
Doing well on cymbalta and wishes to continue.

## 2018-12-25 ENCOUNTER — Other Ambulatory Visit: Payer: Self-pay | Admitting: *Deleted

## 2018-12-26 MED ORDER — TRAZODONE HCL 50 MG PO TABS: 25.0000 mg | ORAL_TABLET | Freq: Every evening | ORAL | 2 refills | Status: DC | PRN

## 2019-01-14 ENCOUNTER — Telehealth: Payer: Self-pay | Admitting: Internal Medicine

## 2019-01-14 NOTE — Telephone Encounter (Signed)
appt scheduled

## 2019-01-14 NOTE — Telephone Encounter (Signed)
Would recommend visit with [place] or gyn. She just had screening mammogram in September so a physical exam and history of problem is more appropriate to start with than imaging.

## 2019-01-14 NOTE — Telephone Encounter (Signed)
Patient experiencing left side breast sorness  for 4 days, patient would like imaging orders sent to  [facility], please advise patient when orders are sent.

## 2019-01-15 ENCOUNTER — Encounter: Payer: Self-pay | Admitting: Internal Medicine

## 2019-01-15 ENCOUNTER — Other Ambulatory Visit: Payer: Self-pay

## 2019-01-15 ENCOUNTER — Ambulatory Visit (INDEPENDENT_AMBULATORY_CARE_PROVIDER_SITE_OTHER): Payer: Medicare Other | Admitting: Internal Medicine

## 2019-01-15 VITALS — BP 122/80 | HR 75 | Temp 98.1°F | Ht 67.0 in | Wt 184.0 lb

## 2019-01-15 DIAGNOSIS — N644 Mastodynia: Secondary | ICD-10-CM

## 2019-01-15 NOTE — Patient Instructions (Signed)
We will check the mammogram to make sure the breast is normal but I think this is a muscle rather than the breast.

## 2019-01-15 NOTE — Progress Notes (Signed)
   Subjective:   Patient ID: [name], female    DOB: 04-26-1950, 68 y.o.   MRN: [id number]  HPI The patient is a 68 YO female coming in for breast soreness. Started about 4-5 days ago. She has been doing some new exercises with lifting over the head. She is not sure if this was related. She is concerned about the breasts. Last mammogram Sept 2020 and normal but with some breast tissue. She denies skin color change, lump, nipple discharge. Pain is overall improving but she is still concerned about it.   Review of Systems  Constitutional: Negative.   HENT: Negative.   Eyes: Negative.   Respiratory: Negative for cough, chest tightness and shortness of breath.        Breast soreness left  Cardiovascular: Negative for chest pain, palpitations and leg swelling.  Gastrointestinal: Negative for abdominal distention, abdominal pain, constipation, diarrhea, nausea and vomiting.  Musculoskeletal: Negative.   Skin: Negative.   Neurological: Negative.   Psychiatric/Behavioral: Negative.     Objective:  Physical Exam Constitutional:      Appearance: She is well-developed.  HENT:     Head: Normocephalic and atraumatic.  Neck:     Musculoskeletal: Normal range of motion.  Cardiovascular:     Rate and Rhythm: Normal rate and regular rhythm.     Comments: Normal breast exam, no masses or lumps, minimal tenderness to palpation.  Pulmonary:     Effort: Pulmonary effort is normal. No respiratory distress.     Breath sounds: Normal breath sounds. No wheezing or rales.  Abdominal:     General: Bowel sounds are normal. There is no distension.     Palpations: Abdomen is soft.     Tenderness: There is no abdominal tenderness. There is no rebound.  Skin:    General: Skin is warm and dry.  Neurological:     Mental Status: She is alert and oriented to person, place, and time.     Coordination: Coordination normal.     Vitals:   01/15/19 0933  BP: 122/80  Pulse: 75  Temp: 98.1 F (36.7  C)  TempSrc: Oral  SpO2: 96%  Weight: 184 lb (83.5 kg)  Height: 5\' 7"  (1.702 m)    Assessment & Plan:

## 2019-01-15 NOTE — Assessment & Plan Note (Signed)
Suspect muscular and no masses detected on exam. Ordered diagnostic mammogram as cautionary measure.

## 2019-01-16 ENCOUNTER — Other Ambulatory Visit: Payer: Self-pay | Admitting: Internal Medicine

## 2019-01-16 DIAGNOSIS — N644 Mastodynia: Secondary | ICD-10-CM

## 2019-01-28 ENCOUNTER — Ambulatory Visit
Admission: RE | Admit: 2019-01-28 | Discharge: 2019-01-28 | Disposition: A | Discharge status: Home / Self Care | Payer: Medicare Other | Source: Ambulatory Visit | Attending: Internal Medicine | Admitting: Internal Medicine

## 2019-01-28 ENCOUNTER — Other Ambulatory Visit: Payer: Self-pay

## 2019-01-28 DIAGNOSIS — N644 Mastodynia: Secondary | ICD-10-CM

## 2019-06-18 ENCOUNTER — Other Ambulatory Visit: Payer: Self-pay | Admitting: Internal Medicine

## 2019-08-04 ENCOUNTER — Other Ambulatory Visit: Payer: Self-pay | Admitting: Internal Medicine

## 2019-08-12 DIAGNOSIS — I10 Essential (primary) hypertension: Secondary | ICD-10-CM | POA: Diagnosis not present

## 2019-08-12 DIAGNOSIS — E559 Vitamin D deficiency, unspecified: Secondary | ICD-10-CM | POA: Diagnosis not present

## 2019-08-12 DIAGNOSIS — E039 Hypothyroidism, unspecified: Secondary | ICD-10-CM | POA: Diagnosis not present

## 2019-09-16 ENCOUNTER — Other Ambulatory Visit: Payer: Self-pay | Admitting: Internal Medicine

## 2019-10-13 ENCOUNTER — Other Ambulatory Visit: Payer: Self-pay | Admitting: Internal Medicine

## 2019-10-13 DIAGNOSIS — Z1231 Encounter for screening mammogram for malignant neoplasm of breast: Secondary | ICD-10-CM

## 2019-11-05 ENCOUNTER — Other Ambulatory Visit: Payer: Self-pay | Admitting: Internal Medicine

## 2019-11-19 ENCOUNTER — Other Ambulatory Visit: Payer: Self-pay

## 2019-11-19 ENCOUNTER — Ambulatory Visit
Admission: RE | Admit: 2019-11-19 | Discharge: 2019-11-19 | Disposition: A | Discharge status: Home / Self Care | Payer: TRICARE For Life (TFL) | Source: Ambulatory Visit | Attending: Internal Medicine | Admitting: Internal Medicine

## 2019-11-19 DIAGNOSIS — Z1231 Encounter for screening mammogram for malignant neoplasm of breast: Secondary | ICD-10-CM | POA: Diagnosis not present

## 2019-12-15 ENCOUNTER — Other Ambulatory Visit: Payer: Self-pay | Admitting: Internal Medicine

## 2019-12-30 ENCOUNTER — Encounter: Payer: Medicare Other | Admitting: Internal Medicine

## 2020-01-29 ENCOUNTER — Inpatient Hospital Stay (HOSPITAL_COMMUNITY)
Admission: EM | Admit: 2020-01-29 | Discharge: 2020-01-31 | DRG: 392 | Disposition: A | Discharge status: Home / Self Care | Payer: Medicare PPO | Attending: Internal Medicine | Admitting: Internal Medicine

## 2020-01-29 ENCOUNTER — Other Ambulatory Visit: Payer: Self-pay

## 2020-01-29 ENCOUNTER — Emergency Department (HOSPITAL_COMMUNITY): Payer: Medicare PPO

## 2020-01-29 ENCOUNTER — Encounter (HOSPITAL_COMMUNITY): Payer: Self-pay | Admitting: Emergency Medicine

## 2020-01-29 DIAGNOSIS — F3340 Major depressive disorder, recurrent, in remission, unspecified: Secondary | ICD-10-CM | POA: Diagnosis present

## 2020-01-29 DIAGNOSIS — M545 Low back pain, unspecified: Secondary | ICD-10-CM | POA: Insufficient documentation

## 2020-01-29 DIAGNOSIS — Z8261 Family history of arthritis: Secondary | ICD-10-CM

## 2020-01-29 DIAGNOSIS — D519 Vitamin B12 deficiency anemia, unspecified: Secondary | ICD-10-CM | POA: Insufficient documentation

## 2020-01-29 DIAGNOSIS — K5720 Diverticulitis of large intestine with perforation and abscess without bleeding: Secondary | ICD-10-CM | POA: Diagnosis not present

## 2020-01-29 DIAGNOSIS — Z90710 Acquired absence of both cervix and uterus: Secondary | ICD-10-CM | POA: Diagnosis not present

## 2020-01-29 DIAGNOSIS — D1803 Hemangioma of intra-abdominal structures: Secondary | ICD-10-CM

## 2020-01-29 DIAGNOSIS — G4709 Other insomnia: Secondary | ICD-10-CM | POA: Diagnosis present

## 2020-01-29 DIAGNOSIS — E782 Mixed hyperlipidemia: Secondary | ICD-10-CM | POA: Diagnosis present

## 2020-01-29 DIAGNOSIS — Z8249 Family history of ischemic heart disease and other diseases of the circulatory system: Secondary | ICD-10-CM | POA: Diagnosis not present

## 2020-01-29 DIAGNOSIS — R109 Unspecified abdominal pain: Secondary | ICD-10-CM | POA: Insufficient documentation

## 2020-01-29 DIAGNOSIS — K5730 Diverticulosis of large intestine without perforation or abscess without bleeding: Secondary | ICD-10-CM | POA: Diagnosis present

## 2020-01-29 DIAGNOSIS — I10 Essential (primary) hypertension: Secondary | ICD-10-CM | POA: Diagnosis not present

## 2020-01-29 DIAGNOSIS — Z1211 Encounter for screening for malignant neoplasm of colon: Secondary | ICD-10-CM | POA: Insufficient documentation

## 2020-01-29 DIAGNOSIS — E785 Hyperlipidemia, unspecified: Secondary | ICD-10-CM | POA: Diagnosis present

## 2020-01-29 DIAGNOSIS — Z20822 Contact with and (suspected) exposure to covid-19: Secondary | ICD-10-CM | POA: Diagnosis present

## 2020-01-29 DIAGNOSIS — Z86010 Personal history of colonic polyps: Secondary | ICD-10-CM

## 2020-01-29 DIAGNOSIS — E568 Deficiency of other vitamins: Secondary | ICD-10-CM | POA: Insufficient documentation

## 2020-01-29 DIAGNOSIS — R195 Other fecal abnormalities: Secondary | ICD-10-CM | POA: Insufficient documentation

## 2020-01-29 DIAGNOSIS — Z79899 Other long term (current) drug therapy: Secondary | ICD-10-CM | POA: Diagnosis not present

## 2020-01-29 DIAGNOSIS — G4700 Insomnia, unspecified: Secondary | ICD-10-CM | POA: Diagnosis present

## 2020-01-29 DIAGNOSIS — K5792 Diverticulitis of intestine, part unspecified, without perforation or abscess without bleeding: Secondary | ICD-10-CM | POA: Diagnosis present

## 2020-01-29 DIAGNOSIS — F32A Depression, unspecified: Secondary | ICD-10-CM | POA: Diagnosis present

## 2020-01-29 DIAGNOSIS — E039 Hypothyroidism, unspecified: Secondary | ICD-10-CM | POA: Diagnosis present

## 2020-01-29 DIAGNOSIS — K625 Hemorrhage of anus and rectum: Secondary | ICD-10-CM | POA: Insufficient documentation

## 2020-01-29 DIAGNOSIS — R141 Gas pain: Secondary | ICD-10-CM | POA: Insufficient documentation

## 2020-01-29 DIAGNOSIS — Z79890 Hormone replacement therapy (postmenopausal): Secondary | ICD-10-CM

## 2020-01-29 DIAGNOSIS — Z800 Family history of malignant neoplasm of digestive organs: Secondary | ICD-10-CM

## 2020-01-29 DIAGNOSIS — R142 Eructation: Secondary | ICD-10-CM | POA: Insufficient documentation

## 2020-01-29 DIAGNOSIS — F411 Generalized anxiety disorder: Secondary | ICD-10-CM | POA: Diagnosis present

## 2020-01-29 DIAGNOSIS — M159 Polyosteoarthritis, unspecified: Secondary | ICD-10-CM | POA: Insufficient documentation

## 2020-01-29 DIAGNOSIS — F320 Major depressive disorder, single episode, mild: Secondary | ICD-10-CM | POA: Diagnosis not present

## 2020-01-29 DIAGNOSIS — E079 Disorder of thyroid, unspecified: Secondary | ICD-10-CM | POA: Insufficient documentation

## 2020-01-29 DIAGNOSIS — M1990 Unspecified osteoarthritis, unspecified site: Secondary | ICD-10-CM | POA: Diagnosis present

## 2020-01-29 DIAGNOSIS — K5904 Chronic idiopathic constipation: Secondary | ICD-10-CM | POA: Diagnosis present

## 2020-01-29 DIAGNOSIS — K5732 Diverticulitis of large intestine without perforation or abscess without bleeding: Secondary | ICD-10-CM | POA: Insufficient documentation

## 2020-01-29 DIAGNOSIS — R1033 Periumbilical pain: Secondary | ICD-10-CM | POA: Insufficient documentation

## 2020-01-29 LAB — COMPREHENSIVE METABOLIC PANEL
ALT: 10 U/L (ref 0–44)
AST: 14 U/L — ABNORMAL LOW (ref 15–41)
Albumin: 3.9 g/dL (ref 3.5–5.0)
Alkaline Phosphatase: 59 U/L (ref 38–126)
Anion gap: 9 (ref 5–15)
BUN: 14 mg/dL (ref 8–23)
CO2: 25 mmol/L (ref 22–32)
Calcium: 8.9 mg/dL (ref 8.9–10.3)
Chloride: 101 mmol/L (ref 98–111)
Creatinine, Ser: 0.59 mg/dL (ref 0.44–1.00)
GFR, Estimated: >60 mL/min (ref >60)
Glucose, Bld: 110 mg/dL — ABNORMAL HIGH (ref 70–99)
Potassium: 3.6 mmol/L (ref 3.5–5.1)
Sodium: 135 mmol/L (ref 135–145)
Total Bilirubin: 0.6 mg/dL (ref 0.3–1.2)
Total Protein: 8 g/dL (ref 6.5–8.1)

## 2020-01-29 LAB — CBC
HCT: 42.5 % (ref 36.0–46.0)
HCT: 38.2 % (ref 36.0–46.0)
Hemoglobin: 14.1 g/dL (ref 12.0–15.0)
Hemoglobin: 12.5 g/dL (ref 12.0–15.0)
MCH: 30.3 pg (ref 26.0–34.0)
MCH: 30 pg (ref 26.0–34.0)
MCHC: 33.2 g/dL (ref 30.0–36.0)
MCHC: 32.7 g/dL (ref 30.0–36.0)
MCV: 91.4 fL (ref 80.0–100.0)
MCV: 91.8 fL (ref 80.0–100.0)
Platelets: 244 10*3/uL (ref 150–400)
Platelets: 220 10*3/uL (ref 150–400)
RBC: 4.65 MIL/uL (ref 3.87–5.11)
RBC: 4.16 MIL/uL (ref 3.87–5.11)
RDW: 13.5 % (ref 11.5–15.5)
RDW: 13.5 % (ref 11.5–15.5)
WBC: 8.8 10*3/uL (ref 4.0–10.5)
WBC: 8.8 10*3/uL (ref 4.0–10.5)
nRBC: 0 % (ref 0.0–0.2)
nRBC: 0 % (ref 0.0–0.2)

## 2020-01-29 LAB — URINALYSIS, ROUTINE W REFLEX MICROSCOPIC
Bilirubin Urine: NEGATIVE
Glucose, UA: NEGATIVE mg/dL
Hgb urine dipstick: NEGATIVE
Ketones, ur: NEGATIVE mg/dL
Nitrite: NEGATIVE
Protein, ur: NEGATIVE mg/dL
Specific Gravity, Urine: 1.019 (ref 1.005–1.030)
pH: 5 (ref 5.0–8.0)

## 2020-01-29 LAB — RESP PANEL BY RT-PCR (FLU A&B, COVID) ARPGX2
Influenza A by PCR: NEGATIVE
Influenza B by PCR: NEGATIVE
SARS Coronavirus 2 by RT PCR: NEGATIVE

## 2020-01-29 LAB — PHOSPHORUS: Phosphorus: 2.7 mg/dL (ref 2.5–4.6)

## 2020-01-29 LAB — SURGICAL PCR SCREEN
MRSA, PCR: NEGATIVE
Staphylococcus aureus: NEGATIVE

## 2020-01-29 LAB — CREATININE, SERUM
Creatinine, Ser: 0.61 mg/dL (ref 0.44–1.00)
GFR, Estimated: >60 mL/min (ref >60)

## 2020-01-29 LAB — LIPASE, BLOOD: Lipase: 27 U/L (ref 11–51)

## 2020-01-29 LAB — PREALBUMIN: Prealbumin: 11.5 mg/dL — ABNORMAL LOW (ref 18–38)

## 2020-01-29 LAB — TSH: TSH: 4.518 u[IU]/mL — ABNORMAL HIGH (ref 0.350–4.500)

## 2020-01-29 LAB — HIV ANTIBODY (ROUTINE TESTING W REFLEX): HIV Screen 4th Generation wRfx: NONREACTIVE

## 2020-01-29 LAB — MAGNESIUM: Magnesium: 2.1 mg/dL (ref 1.7–2.4)

## 2020-01-29 MED ORDER — LIP MEDEX EX OINT
1.0000 "application " | TOPICAL_OINTMENT | Freq: Two times a day (BID) | CUTANEOUS | Status: DC
  Administered 2020-01-29 – 2020-01-31 (×4): 1 via TOPICAL
  Filled 2020-01-29: qty 7

## 2020-01-29 MED ORDER — MORPHINE SULFATE (PF) 4 MG/ML IV SOLN
4.0000 mg | Freq: Once | INTRAVENOUS | Status: AC
  Administered 2020-01-29: 4 mg via INTRAVENOUS
  Filled 2020-01-29: qty 1

## 2020-01-29 MED ORDER — MORPHINE SULFATE (PF) 2 MG/ML IV SOLN: 2.0000 mg | INTRAVENOUS | Status: DC | PRN

## 2020-01-29 MED ORDER — ONDANSETRON HCL 4 MG/2ML IJ SOLN: 4.0000 mg | Freq: Four times a day (QID) | INTRAMUSCULAR | Status: DC | PRN

## 2020-01-29 MED ORDER — IOHEXOL 300 MG/ML  SOLN
100.0000 mL | Freq: Once | INTRAMUSCULAR | Status: AC | PRN
  Administered 2020-01-29: 100 mL via INTRAVENOUS

## 2020-01-29 MED ORDER — SODIUM CHLORIDE 0.9 % IV SOLN
8.0000 mg | Freq: Four times a day (QID) | INTRAVENOUS | Status: DC | PRN
  Filled 2020-01-29: qty 4

## 2020-01-29 MED ORDER — ENOXAPARIN SODIUM 40 MG/0.4ML ~~LOC~~ SOLN
40.0000 mg | SUBCUTANEOUS | Status: DC
  Administered 2020-01-29 – 2020-01-30 (×2): 40 mg via SUBCUTANEOUS
  Filled 2020-01-29 (×2): qty 0.4

## 2020-01-29 MED ORDER — DEXTROSE IN LACTATED RINGERS 5 % IV SOLN: INTRAVENOUS | Status: DC

## 2020-01-29 MED ORDER — LACTATED RINGERS IV BOLUS: 1000.0000 mL | Freq: Three times a day (TID) | INTRAVENOUS | Status: DC | PRN

## 2020-01-29 MED ORDER — LACTATED RINGERS IV BOLUS
1000.0000 mL | Freq: Once | INTRAVENOUS | Status: AC
  Administered 2020-01-29: 1000 mL via INTRAVENOUS

## 2020-01-29 MED ORDER — HYDROMORPHONE HCL 1 MG/ML IJ SOLN: 0.5000 mg | INTRAMUSCULAR | Status: DC | PRN

## 2020-01-29 MED ORDER — ONDANSETRON HCL 4 MG/2ML IJ SOLN
4.0000 mg | Freq: Once | INTRAMUSCULAR | Status: AC
  Administered 2020-01-29: 4 mg via INTRAVENOUS
  Filled 2020-01-29: qty 2

## 2020-01-29 MED ORDER — ONDANSETRON HCL 4 MG PO TABS: 4.0000 mg | ORAL_TABLET | Freq: Four times a day (QID) | ORAL | Status: DC | PRN

## 2020-01-29 MED ORDER — PROCHLORPERAZINE EDISYLATE 10 MG/2ML IJ SOLN: 5.0000 mg | INTRAMUSCULAR | Status: DC | PRN

## 2020-01-29 MED ORDER — PIPERACILLIN-TAZOBACTAM 3.375 G IVPB
3.3750 g | Freq: Once | INTRAVENOUS | Status: AC
  Administered 2020-01-29: 3.375 g via INTRAVENOUS
  Filled 2020-01-29: qty 50

## 2020-01-29 MED ORDER — SODIUM CHLORIDE 0.9 % IV BOLUS
500.0000 mL | Freq: Once | INTRAVENOUS | Status: AC
  Administered 2020-01-29: 500 mL via INTRAVENOUS

## 2020-01-29 MED ORDER — MAGIC MOUTHWASH
15.0000 mL | Freq: Four times a day (QID) | ORAL | Status: DC | PRN
  Filled 2020-01-29: qty 15

## 2020-01-29 MED ORDER — MUPIROCIN 2 % EX OINT: 1.0000 "application " | TOPICAL_OINTMENT | Freq: Two times a day (BID) | CUTANEOUS | Status: DC

## 2020-01-29 MED ORDER — METHOCARBAMOL 1000 MG/10ML IJ SOLN
1000.0000 mg | Freq: Four times a day (QID) | INTRAVENOUS | Status: DC | PRN
  Filled 2020-01-29: qty 10

## 2020-01-29 MED ORDER — DIPHENHYDRAMINE HCL 50 MG/ML IJ SOLN
12.5000 mg | Freq: Four times a day (QID) | INTRAMUSCULAR | Status: DC | PRN
  Administered 2020-01-31: 25 mg via INTRAVENOUS
  Filled 2020-01-29: qty 1

## 2020-01-29 NOTE — Consult Note (Addendum)
[facility]  1950-08-25 [id number]  CARE TEAM:  PCP: Hoyt Koch, MD  Outpatient Care Team: Patient Care Team: Hoyt Koch, MD as PCP - General (Internal Medicine) Juanita Craver, MD as Consulting Physician (Gastroenterology) Katheran James., MD as Consulting Physician (Endocrinology)  Inpatient Treatment Team: Treatment Team: Attending Provider: Elwyn Reach, MD; Rounding Team: Jackelyn Knife, MD; Registered Nurse: Ainsley Spinner, RN; Technician: Sharren Bridge, NT; Registered Nurse: Jama Flavors, RN; Consulting Physician: Edison Pace, Md, MD   This patient is a 69 y.o.female who presents today for surgical evaluation at the request of Dr Jonelle Sidle.   Chief complaint / Reason for evaluation: Diverticulitis  69 year old woman with lower abdominal & flank pain  pain starting 4 days ago.  Seem to be more right-sided.  Worsening pain with activity.  Feeling worse.  Loose irregular bowel movements.  Watery stools.  Feeling more uncomfortable and bedridden.  Now constipated.  Brought to the emergency room.  Patient had colonoscopy by Dr. Juanita Craver in 2003.  Claims a family history of colorectal cancer (sister) so gets colonoscopies every 5 years.  Hemorrhoids noted & few polyps or other major issues. Patient does not recall any prior episodes of diverticulitis yet there is documentation of diverticulitis in 2015 by Dr. Lorie Apley office.  Only abdominal surgery was a hysterectomy   Assessment  Kassie Mends Czerwinski  68 y.o. female       Problem List:  Principal Problem:   Diverticulitis of sigmoid colon with focal perforation Active Problems:   Essential hypertension   MDD (recurrent major depressive disorder) in remission ([place])   Hypothyroidism   Other and unspecified hyperlipidemia   Lumbar back pain   Liver hemangioma, right   Family history of colorectal cancer   Generalized anxiety disorder   Persistent insomnia   Chronic idiopathic  constipation   Diverticulosis of colon   Sigmoid colon diverticulitis with a few focal points of perforation that appears contained without massive pneumoperitoneum nor shock  Plan:  Agree with hospitalization.  IV antibiotics.  Would do IV Zosyn given complex attack.  IV fluids resuscitation  Bowel rest for now.  NPO except ice chips  Nausea and pain control.  Surgery will follow closely.  Patient does not have strong evidence of peritonitis nor shock.  Does not seem to be a large volume of pneumoperitoneum.  As long as patient is clinically stable, would try and do bowel rest control with antibiotics.  Patient is at moderately increased risk of abscess formation.  If does not have good resolution of symptoms may benefit from repeat CT scan in 4-5 days to rule that out.  If markedly declines or fails medical intervention, may require operative intervention this admission.    See if we can get records from last colonoscopy.  VTE prophylaxis- SCDs, etc  H/o hypothyroidism - synthroid.  Check TSH  mobilize as tolerated to help recovery    Adin Hector, MD, FACS, MASCRS Gastrointestinal and Minimally Invasive Surgery  [facility] [address], [place], [place] [address] [phone] Fax [phone] Main/Paging  CONTACT INFORMATION: Weekday (9AM-5PM) concerns: Call [facility] main office at [phone] Weeknight (5PM-9AM) or Weekend/Holiday concerns: Check [email or web address] for General Surgery [facility] coverage (Please, do not use SecureChat as it is not reliable communication to operating surgeons for immediate patient care)      01/29/2020      Past Medical History:  Diagnosis Date  . Arthritis   .  Blood in stool   . Colon polyps   . Hyperlipidemia   . Hypertension   . Thyroid disease     Past Surgical History:  Procedure Laterality Date  . ABDOMINAL HYSTERECTOMY    . KNEE SURGERY Left 2018    Social History   Socioeconomic  History  . Marital status: Married    Spouse name: Not on file  . Number of children: Not on file  . Years of education: Not on file  . Highest education level: Not on file  Occupational History  . Not on file  Tobacco Use  . Smoking status: Never Smoker  . Smokeless tobacco: Never Used  Substance and Sexual Activity  . Alcohol use: No    Alcohol/week: 0.0 standard drinks  . Drug use: No  . Sexual activity: Not on file  Other Topics Concern  . Not on file  Social History Narrative  . Not on file   Social Determinants of Health   Financial Resource Strain:   . Difficulty of Paying Living Expenses: Not on file  Food Insecurity:   . Worried About Charity fundraiser in the Last Year: Not on file  . Ran Out of Food in the Last Year: Not on file  Transportation Needs:   . Lack of Transportation (Medical): Not on file  . Lack of Transportation (Non-Medical): Not on file  Physical Activity:   . Days of Exercise per Week: Not on file  . Minutes of Exercise per Session: Not on file  Stress:   . Feeling of Stress : Not on file  Social Connections:   . Frequency of Communication with Friends and Family: Not on file  . Frequency of Social Gatherings with Friends and Family: Not on file  . Attends Religious Services: Not on file  . Active Member of Clubs or Organizations: Not on file  . Attends Archivist Meetings: Not on file  . Marital Status: Not on file  Intimate Partner Violence:   . Fear of Current or Ex-Partner: Not on file  . Emotionally Abused: Not on file  . Physically Abused: Not on file  . Sexually Abused: Not on file    Family History  Problem Relation Age of Onset  . Hypertension Mother   . Arthritis Sister   . Cancer Sister        breast and colon  . Diabetes Sister   . Diabetes Paternal Aunt   . Hypertension Paternal Grandmother     Current Facility-Administered Medications  Medication Dose Route Frequency Provider Last Rate Last Admin  .  diphenhydrAMINE (BENADRYL) injection 12.5-25 mg  12.5-25 mg Intravenous Q6H PRN Michael Boston, MD      . HYDROmorphone (DILAUDID) injection 0.5-2 mg  0.5-2 mg Intravenous Q2H PRN Michael Boston, MD      . lactated ringers bolus 1,000 mL  1,000 mL Intravenous Once Michael Boston, MD      . lactated ringers bolus 1,000 mL  1,000 mL Intravenous Q8H PRN Michael Boston, MD      . lip balm (CARMEX) ointment 1 application  1 application Topical BID Michael Boston, MD      . magic mouthwash  15 mL Oral QID PRN Michael Boston, MD      . methocarbamol (ROBAXIN) 1,000 mg in dextrose 5 % 100 mL IVPB  1,000 mg Intravenous Q6H PRN Michael Boston, MD      . ondansetron [facility]) injection 4 mg  4 mg Intravenous Q6H PRN Michael Boston,  MD       Or  . ondansetron (ZOFRAN) 8 mg in sodium chloride 0.9 % 50 mL IVPB  8 mg Intravenous Q6H PRN Michael Boston, MD      . piperacillin-tazobactam (ZOSYN) IVPB 3.375 g  3.375 g Intravenous Once Suella Broad A, PA-C 12.5 mL/hr at 01/29/20 1815 3.375 g at 01/29/20 1815  . prochlorperazine (COMPAZINE) injection 5-10 mg  5-10 mg Intravenous Q4H PRN Michael Boston, MD       Current Outpatient Medications  Medication Sig Dispense Refill  . B Complex-C (SUPER B COMPLEX PO) Take 1 tablet by mouth daily.     Marland Kitchen docusate sodium (COLACE) 100 MG capsule Take 100 mg by mouth 2 (two) times daily.     . DULoxetine (CYMBALTA) 60 MG capsule TAKE ONE CAPSULE BY MOUTH DAILY 90 capsule 1  . fluticasone (FLONASE) 50 MCG/ACT nasal spray SHAKE LIQUID AND USE 2 SPRAYS IN EACH NOSTRIL DAILY (Patient taking differently: Place 1 spray into both nostrils daily as needed for allergies. ) 16 g 3  . hydrochlorothiazide (HYDRODIURIL) 12.5 MG tablet TAKE 1 TABLET BY MOUTH DAILY. ANNUAL APPOINTMENT DUE IN [place]. MUST SEE PROVIDER FOR FUTURE REFILLS. (Patient taking differently: Take 12.5 mg by mouth daily. ) 90 tablet 1  . loratadine (CLARITIN) 10 MG tablet Take 1 tablet (10 mg total) by mouth daily. (Patient taking  differently: Take 10 mg by mouth daily as needed for allergies. ) 30 tablet 3  . losartan (COZAAR) 50 MG tablet TAKE 1 TABLET BY MOUTH DAILY 90 tablet 1  . Multiple Vitamins-Minerals (MULTIVITAMIN WITH MINERALS) tablet Take 1 tablet by mouth daily.    . simvastatin (ZOCOR) 20 MG tablet TAKE 1 TABLET(20 MG) BY MOUTH DAILY (Patient taking differently: Take 20 mg by mouth every evening. ) 90 tablet 1  . SYNTHROID 88 MCG tablet Take 88 mcg by mouth daily.    . traZODone (DESYREL) 50 MG tablet TAKE 1/2 TO 2 TABLETS(25 TO 100 MG) BY MOUTH AT BEDTIME AS NEEDED FOR SLEEP (Patient taking differently: Take 75 mg by mouth at bedtime. ) 90 tablet 1  . clobetasol cream (TEMOVATE) 0.05 % APPLY EXTERNALLY TO THE AFFECTED AREA TWICE DAILY (Patient not taking: Reported on 01/29/2020) 30 g 0     No Known Allergies  ROS:   All other systems reviewed & are negative except per HPI or as noted below: Constitutional:  No fevers, ++chills, No sweats.  Weight stable Eyes:  No vision changes, No discharge HENT:  No sore throats, nasal drainage Lymph: No neck swelling, No bruising easily Pulmonary:  No cough, productive sputum CV: No orthopnea, PND   No exertional chest/neck/shoulder/arm pain. GI:  No personal nor family history of inflammatory bowel disease, irritable bowel syndrome, allergy such as Celiac Sprue, dietary/dairy problems, colitis, ulcers nor gastritis.  No recent sick contacts/gastroenteritis.  No travel outside the country.  No changes in diet. Renal: No UTIs, No hematuria Genital:  No drainage, bleeding, masses Musculoskeletal: Flank/back pain  Good ROM major joints Skin:  No sores or lesions.  No rashes Heme/Lymph:  No easy bleeding.  No swollen lymph nodes Neuro: No focal weakness/numbness.  No seizures Psych: No suicidal ideation.  No hallucinations  BP (!) 120/58   Pulse 71   Temp 98.1 F (36.7 C)   Resp 16   SpO2 97%      Results:   Labs: Results for orders placed or performed  during the hospital encounter of 01/29/20 (from the past 48  hour(s))  Lipase, blood     Status: None   Collection Time: 01/29/20  3:25 PM  Result Value Ref Range   Lipase 27 11 - 51 U/L    Comment: Performed at [facility], [place] [address] [place], [place] [address]  Comprehensive metabolic panel     Status: Abnormal   Collection Time: 01/29/20  3:25 PM  Result Value Ref Range   Sodium 135 135 - 145 mmol/L   Potassium 3.6 3.5 - 5.1 mmol/L   Chloride 101 98 - 111 mmol/L   CO2 25 22 - 32 mmol/L   Glucose, Bld 110 (H) 70 - 99 mg/dL    Comment: Glucose reference range applies only to samples taken after fasting for at least 8 hours.   BUN 14 8 - 23 mg/dL   Creatinine, Ser 0.59 0.44 - 1.00 mg/dL   Calcium 8.9 8.9 - 10.3 mg/dL   Total Protein 8.0 6.5 - 8.1 g/dL   Albumin 3.9 3.5 - 5.0 g/dL   AST 14 (L) 15 - 41 U/L   ALT 10 0 - 44 U/L   Alkaline Phosphatase 59 38 - 126 U/L   Total Bilirubin 0.6 0.3 - 1.2 mg/dL   GFR, Estimated >60 >60 mL/min    Comment: (NOTE) Calculated using the CKD-EPI Creatinine Equation (2021)    Anion gap 9 5 - 15    Comment: Performed at [facility], [place] [address] [place], [place] [address]  CBC     Status: None   Collection Time: 01/29/20  3:25 PM  Result Value Ref Range   WBC 8.8 4.0 - 10.5 K/uL   RBC 4.65 3.87 - 5.11 MIL/uL   Hemoglobin 14.1 12.0 - 15.0 g/dL   HCT 42.5 36 - 46 %   MCV 91.4 80.0 - 100.0 fL   MCH 30.3 26.0 - 34.0 pg   MCHC 33.2 30.0 - 36.0 g/dL   RDW 13.5 11.5 - 15.5 %   Platelets 244 150 - 400 K/uL   nRBC 0.0 0.0 - 0.2 %    Comment: Performed at [facility], [place] [address] [place], [place] [address]  Urinalysis, Routine w reflex microscopic Urine, Clean Catch     Status: Abnormal   Collection Time: 01/29/20  4:14 PM  Result Value Ref Range   Color, Urine AMBER (A) YELLOW    Comment: BIOCHEMICALS MAY BE AFFECTED BY COLOR   APPearance CLOUDY (A) CLEAR   Specific Gravity,  Urine 1.019 1.005 - 1.030   pH 5.0 5.0 - 8.0   Glucose, UA NEGATIVE NEGATIVE mg/dL   Hgb urine dipstick NEGATIVE NEGATIVE   Bilirubin Urine NEGATIVE NEGATIVE   Ketones, ur NEGATIVE NEGATIVE mg/dL   Protein, ur NEGATIVE NEGATIVE mg/dL   Nitrite NEGATIVE NEGATIVE   Leukocytes,Ua TRACE (A) NEGATIVE   RBC / HPF 21-50 0 - 5 RBC/hpf   WBC, UA 0-5 0 - 5 WBC/hpf   Bacteria, UA FEW (A) NONE SEEN   Squamous Epithelial / LPF 0-5 0 - 5   Mucus PRESENT     Comment: Performed at [facility], [place] [address], [place], [place] [address]  Resp Panel by RT-PCR (Flu A&B, Covid) Nasopharyngeal Swab     Status: None   Collection Time: 01/29/20  6:17 PM   Specimen: Nasopharyngeal Swab; Nasopharyngeal(NP) swabs in vial transport medium  Result Value Ref Range   SARS Coronavirus 2 by RT PCR NEGATIVE NEGATIVE    Comment: (NOTE) SARS-CoV-2  target nucleic acids are NOT DETECTED.  The SARS-CoV-2 RNA is generally detectable in upper respiratory specimens during the acute phase of infection. The lowest concentration of SARS-CoV-2 viral copies this assay can detect is 138 copies/mL. A negative result does not preclude SARS-Cov-2 infection and should not be used as the sole basis for treatment or other patient management decisions. A negative result may occur with  improper specimen collection/handling, submission of specimen other than nasopharyngeal swab, presence of viral mutation(s) within the areas targeted by this assay, and inadequate number of viral copies(<138 copies/mL). A negative result must be combined with clinical observations, patient history, and epidemiological information. The expected result is Negative.  Fact Sheet for Patients:  EntrepreneurPulse.com.au  Fact Sheet for Healthcare Providers:  IncredibleEmployment.be  This test is no t yet approved or cleared by the [place] FDA and  has been authorized for detection and/or  diagnosis of SARS-CoV-2 by FDA under an Emergency Use Authorization (EUA). This EUA will remain  in effect (meaning this test can be used) for the duration of the COVID-19 declaration under Section 564(b)(1) of the Act, 21 U.S.C.section 360bbb-3(b)(1), unless the authorization is terminated  or revoked sooner.       Influenza A by PCR NEGATIVE NEGATIVE   Influenza B by PCR NEGATIVE NEGATIVE    Comment: (NOTE) The Xpert Xpress SARS-CoV-2/FLU/RSV plus assay is intended as an aid in the diagnosis of influenza from Nasopharyngeal swab specimens and should not be used as a sole basis for treatment. Nasal washings and aspirates are unacceptable for Xpert Xpress SARS-CoV-2/FLU/RSV testing.  Fact Sheet for Patients: EntrepreneurPulse.com.au  Fact Sheet for Healthcare Providers: IncredibleEmployment.be  This test is not yet approved or cleared by the [place] FDA and has been authorized for detection and/or diagnosis of SARS-CoV-2 by FDA under an Emergency Use Authorization (EUA). This EUA will remain in effect (meaning this test can be used) for the duration of the COVID-19 declaration under Section 564(b)(1) of the Act, 21 U.S.C. section 360bbb-3(b)(1), unless the authorization is terminated or revoked.  Performed at [facility], [place] [address] [place], [place] [address]     Imaging / Studies: CT Abdomen Pelvis W Contrast  Result Date: 01/29/2020 CLINICAL DATA:  RIGHT abdominal pain.  Flank pain and back pain. EXAM: CT ABDOMEN AND PELVIS WITH CONTRAST TECHNIQUE: Multidetector CT imaging of the abdomen and pelvis was performed using the standard protocol following bolus administration of intravenous contrast. CONTRAST:  138mL OMNIPAQUE IOHEXOL 300 MG/ML  SOLN COMPARISON:  MRI report 08/30/2001 FINDINGS: Lower chest: 4 mm nodule at the LEFT lung base (image 18/series 3). Hepatobiliary: Large peripheral enhancing lesion in  the RIGHT hepatic lobe measuring 4.6 cm. This lesion has peripheral and central enhancement suggesting a benign hemangioma. By report, large benign hemangioma in the RIGHT hepatic lobe on MRI 2003. No additional hepatic lesion identified. Gallbladder normal. Common bile duct. Pancreas: Pancreas is normal. No ductal dilatation. No pancreatic inflammation. Spleen: Normal spleen Adrenals/urinary tract: Adrenal glands and kidneys are normal. The ureters and bladder normal. Stomach/Bowel: Stomach, small bowel, appendix, and cecum are normal. In the proximal sigmoid colon, there is a pericolonic inflammation within the sigmoid mesocolon. Additionally there is a curvilinear focus of extraluminal gas (image 94/6) along the sigmoid mesocolon extending towards the small bowel. This is through a region of several diverticula of the proximal sigmoid colon. Findings are most consistent with acute diverticulitis with contained perforation. No abscess formation. More distal sigmoid colon rectosigmoid colon normal. Vascular/Lymphatic:  Abdominal aorta is normal caliber. No periportal or retroperitoneal adenopathy. No pelvic adenopathy. Reproductive: Post hysterectomy.  Adnexa unremarkable Other: No free fluid. Musculoskeletal: No aggressive osseous lesion. IMPRESSION: 1. Acute diverticulitis of the proximal sigmoid colon with contained perforation. Moderate inflammation in the sigmoid mesocolon. No abscess formation. Recommend follow-up CT imaging to demonstrate resolution. 2. Large enhancing lesion in the RIGHT hepatic lobe is favored benign hemangioma. Benign hemangioma described within the RIGHT hepatic lobe on comparison MRI port in 2003. Electronically Signed   By: Suzy Bouchard M.D.   On: 01/29/2020 17:44    Medications / Allergies: per chart  Antibiotics: Anti-infectives (From admission, onward)   Start     Dose/Rate Route Frequency Ordered Stop   01/29/20 1815  piperacillin-tazobactam (ZOSYN) IVPB 3.375 g         3.375 g 12.5 mL/hr over 240 Minutes Intravenous Once 01/29/20 1801          Note: Portions of this report may have been transcribed using voice recognition software. Every effort was made to ensure accuracy; however, inadvertent computerized transcription errors may be present.   Any transcriptional errors that result from this process are unintentional.    Adin Hector, MD, FACS, MASCRS Gastrointestinal and Minimally Invasive Surgery  [facility] [address] [place], [place] [address] [phone] Fax [phone] Main/Paging  CONTACT INFORMATION: Weekday (9AM-5PM) concerns: Call [facility] main office at [phone] Weeknight (5PM-9AM) or Weekend/Holiday concerns: Check [email or web address] for General Surgery [facility] coverage (Please, do not use SecureChat as it is not reliable communication to operating surgeons for immediate patient care)      01/29/2020  8:01 PM

## 2020-01-29 NOTE — H&P (Signed)
History and Physical   [name] DGU:[id number] DOB: 13-Nov-1950 DOA: 01/29/2020  Referring MD/NP/PA: Dr. Karle Starch  PCP: Hoyt Koch, MD   Outpatient Specialists: Dr. Alleen Borne, gastroenterology  Patient coming from: Home  Chief Complaint: Left flank abdominal pain  HPI: [name] is a 69 y.o. female with medical history significant of hypertension, hypothyroidism, osteoarthritis and hyperlipidemia who presented with 4 days of lower abdominal and right flank pain.  Pain is worse with movement and any major activity.  Progressively getting worse.  Also when she bent over she was feeling more pain.  Pain is rated as 6 out of 10 currently down to 3 out of 10 with medications.  She has had bouts of diarrhea and now constipated.  She has had prior diverticulitis back in 2015 and has had recurrent colonoscopies by Dr. Collene Mares.  Denied any colon surgery.  Denied any melena no bright red blood per rectum no hematemesis.  Patient was seen and evaluated.  CT abdomen showed sigmoid diverticulitis with contained perforation.  Patient being admitted to the hospital for treatment.  Surgery consulted..  ED Course: Temperature is 98.5 blood pressure 107/82 pulse 88 respiratory 21 oxygen sat 96% on room air.  COVID-19 screen is negative.  Urinalysis showed cloudy urine with RBCs 21-50 but no bacteria no white count.  CBC and chemistry largely within normal.  CT abdomen pelvis showed acute diverticulitis of this proximal sigmoid colon with contained perforation.  Also moderate inflammation in the sigmoid mesocolon no abscess.  There is a large enhancing lesion in the right hepatic lobe probably benign hemangioma which appears to be present before.  Dr. Johney Maine of general surgery consulted and patient being admitted to the hospital.  Review of Systems: As per HPI otherwise 10 point review of systems negative.    Past Medical History:  Diagnosis Date  . Arthritis   . Blood in stool   . Colon  polyps   . Hyperlipidemia   . Hypertension   . Thyroid disease     Past Surgical History:  Procedure Laterality Date  . ABDOMINAL HYSTERECTOMY    . KNEE SURGERY Left 2018     reports that she has never smoked. She has never used smokeless tobacco. She reports that she does not drink alcohol and does not use drugs.  No Known Allergies  Family History  Problem Relation Age of Onset  . Hypertension Mother   . Arthritis Sister   . Cancer Sister        breast and colon  . Diabetes Sister   . Diabetes Paternal Aunt   . Hypertension Paternal Grandmother      Prior to Admission medications   Medication Sig Start Date End Date Taking? Authorizing Provider  B Complex-C (SUPER B COMPLEX PO) Take 1 tablet by mouth daily.    Yes [provider]  docusate sodium (COLACE) 100 MG capsule Take 100 mg by mouth 2 (two) times daily.    Yes [provider]  DULoxetine (CYMBALTA) 60 MG capsule TAKE ONE CAPSULE BY MOUTH DAILY 12/15/19  Yes Hoyt Koch, MD  fluticasone (FLONASE) 50 MCG/ACT nasal spray SHAKE LIQUID AND USE 2 SPRAYS IN EACH NOSTRIL DAILY Patient taking differently: Place 1 spray into both nostrils daily as needed for allergies.  06/20/18  Yes Hoyt Koch, MD  hydrochlorothiazide (HYDRODIURIL) 12.5 MG tablet TAKE 1 TABLET BY MOUTH DAILY. ANNUAL APPOINTMENT DUE IN [place]. MUST SEE PROVIDER FOR FUTURE REFILLS. Patient taking differently: Take 12.5  mg by mouth daily.  12/15/19  Yes Hoyt Koch, MD  loratadine (CLARITIN) 10 MG tablet Take 1 tablet (10 mg total) by mouth daily. Patient taking differently: Take 10 mg by mouth daily as needed for allergies.  10/09/16  Yes Hoyt Koch, MD  losartan (COZAAR) 50 MG tablet TAKE 1 TABLET BY MOUTH DAILY 12/15/19  Yes Hoyt Koch, MD  Multiple Vitamins-Minerals (MULTIVITAMIN WITH MINERALS) tablet Take 1 tablet by mouth daily.   Yes [provider]  simvastatin (ZOCOR) 20 MG  tablet TAKE 1 TABLET(20 MG) BY MOUTH DAILY Patient taking differently: Take 20 mg by mouth every evening.  09/16/19  Yes Hoyt Koch, MD  SYNTHROID 88 MCG tablet Take 88 mcg by mouth daily. 01/14/20  Yes [provider]  traZODone (DESYREL) 50 MG tablet TAKE 1/2 TO 2 TABLETS(25 TO 100 MG) BY MOUTH AT BEDTIME AS NEEDED FOR SLEEP Patient taking differently: Take 75 mg by mouth at bedtime.  11/06/19  Yes Hoyt Koch, MD  clobetasol cream (TEMOVATE) 0.05 % APPLY EXTERNALLY TO THE AFFECTED AREA TWICE DAILY Patient not taking: Reported on 01/29/2020 06/28/17   Hoyt Koch, MD    Physical Exam: Vitals:   01/29/20 1715 01/29/20 1730 01/29/20 1745 01/29/20 1815  BP: 140/78 131/86 107/82 136/77  Pulse: 80 81 77 79  Resp: 15  16 15   Temp:      TempSrc:      SpO2: 99% 100% 98% 96%      Constitutional: NAD, calm, comfortable Vitals:   01/29/20 1715 01/29/20 1730 01/29/20 1745 01/29/20 1815  BP: 140/78 131/86 107/82 136/77  Pulse: 80 81 77 79  Resp: 15  16 15   Temp:      TempSrc:      SpO2: 99% 100% 98% 96%   Eyes: PERRL, lids and conjunctivae normal ENMT: Mucous membranes are moist. Posterior pharynx clear of any exudate or lesions.Normal dentition.  Neck: normal, supple, no masses, no thyromegaly Respiratory: clear to auscultation bilaterally, no wheezing, no crackles. Normal respiratory effort. No accessory muscle use.  Cardiovascular: Regular rate and rhythm, no murmurs / rubs / gallops. No extremity edema. 2+ pedal pulses. No carotid bruits.  Abdomen: Mild upper and lower left quadrant abdominal pain, no masses palpated. No hepatosplenomegaly. Bowel sounds positive.  Musculoskeletal: no clubbing / cyanosis. No joint deformity upper and lower extremities. Good ROM, no contractures. Normal muscle tone.  Skin: no rashes, lesions, ulcers. No induration Neurologic: CN 2-12 grossly intact. Sensation intact, DTR normal. Strength 5/5 in all 4.  Psychiatric:  Normal judgment and insight. Alert and oriented x 3. Normal mood.     Labs on Admission: I have personally reviewed following labs and imaging studies  CBC: Recent Labs  Lab 01/29/20 1525  WBC 8.8  HGB 14.1  HCT 42.5  MCV 91.4  PLT 865   Basic Metabolic Panel: Recent Labs  Lab 01/29/20 1525  NA 135  K 3.6  CL 101  CO2 25  GLUCOSE 110*  BUN 14  CREATININE 0.59  CALCIUM 8.9   GFR: CrCl cannot be calculated (Unknown ideal weight.). Liver Function Tests: Recent Labs  Lab 01/29/20 1525  AST 14*  ALT 10  ALKPHOS 59  BILITOT 0.6  PROT 8.0  ALBUMIN 3.9   Recent Labs  Lab 01/29/20 1525  LIPASE 27   No results for input(s): AMMONIA in the last 168 hours. Coagulation Profile: No results for input(s): INR, PROTIME in the last 168 hours. Cardiac Enzymes:  No results for input(s): CKTOTAL, CKMB, CKMBINDEX, TROPONINI in the last 168 hours. BNP (last 3 results) No results for input(s): PROBNP in the last 8760 hours. HbA1C: No results for input(s): HGBA1C in the last 72 hours. CBG: No results for input(s): GLUCAP in the last 168 hours. Lipid Profile: No results for input(s): CHOL, HDL, LDLCALC, TRIG, CHOLHDL, LDLDIRECT in the last 72 hours. Thyroid Function Tests: No results for input(s): TSH, T4TOTAL, FREET4, T3FREE, THYROIDAB in the last 72 hours. Anemia Panel: No results for input(s): VITAMINB12, FOLATE, FERRITIN, TIBC, IRON, RETICCTPCT in the last 72 hours. Urine analysis:    Component Value Date/Time   COLORURINE AMBER (A) 01/29/2020 1614   APPEARANCEUR CLOUDY (A) 01/29/2020 1614   LABSPEC 1.019 01/29/2020 1614   PHURINE 5.0 01/29/2020 1614   GLUCOSEU NEGATIVE 01/29/2020 1614   GLUCOSEU NEGATIVE 06/29/2015 1134   HGBUR NEGATIVE 01/29/2020 [place] 01/29/2020 [place] 01/29/2020 1614   PROTEINUR NEGATIVE 01/29/2020 1614   UROBILINOGEN 0.2 06/29/2015 1134   NITRITE NEGATIVE 01/29/2020 1614   LEUKOCYTESUR TRACE (A)  01/29/2020 1614   Sepsis Labs: @LABRCNTIP (procalcitonin:4,lacticidven:4) )No results found for this or any previous visit (from the past 240 hour(s)).   Radiological Exams on Admission: CT Abdomen Pelvis W Contrast  Result Date: 01/29/2020 CLINICAL DATA:  RIGHT abdominal pain.  Flank pain and back pain. EXAM: CT ABDOMEN AND PELVIS WITH CONTRAST TECHNIQUE: Multidetector CT imaging of the abdomen and pelvis was performed using the standard protocol following bolus administration of intravenous contrast. CONTRAST:  149mL OMNIPAQUE IOHEXOL 300 MG/ML  SOLN COMPARISON:  MRI report 08/30/2001 FINDINGS: Lower chest: 4 mm nodule at the LEFT lung base (image 18/series 3). Hepatobiliary: Large peripheral enhancing lesion in the RIGHT hepatic lobe measuring 4.6 cm. This lesion has peripheral and central enhancement suggesting a benign hemangioma. By report, large benign hemangioma in the RIGHT hepatic lobe on MRI 2003. No additional hepatic lesion identified. Gallbladder normal. Common bile duct. Pancreas: Pancreas is normal. No ductal dilatation. No pancreatic inflammation. Spleen: Normal spleen Adrenals/urinary tract: Adrenal glands and kidneys are normal. The ureters and bladder normal. Stomach/Bowel: Stomach, small bowel, appendix, and cecum are normal. In the proximal sigmoid colon, there is a pericolonic inflammation within the sigmoid mesocolon. Additionally there is a curvilinear focus of extraluminal gas (image 94/6) along the sigmoid mesocolon extending towards the small bowel. This is through a region of several diverticula of the proximal sigmoid colon. Findings are most consistent with acute diverticulitis with contained perforation. No abscess formation. More distal sigmoid colon rectosigmoid colon normal. Vascular/Lymphatic: Abdominal aorta is normal caliber. No periportal or retroperitoneal adenopathy. No pelvic adenopathy. Reproductive: Post hysterectomy.  Adnexa unremarkable Other: No free fluid.  Musculoskeletal: No aggressive osseous lesion. IMPRESSION: 1. Acute diverticulitis of the proximal sigmoid colon with contained perforation. Moderate inflammation in the sigmoid mesocolon. No abscess formation. Recommend follow-up CT imaging to demonstrate resolution. 2. Large enhancing lesion in the RIGHT hepatic lobe is favored benign hemangioma. Benign hemangioma described within the RIGHT hepatic lobe on comparison MRI port in 2003. Electronically Signed   By: Suzy Bouchard M.D.   On: 01/29/2020 17:44      Assessment/Plan Principal Problem:   Acute diverticulitis Active Problems:   Essential hypertension   Hypothyroidism   Hyperlipidemia     #1 acute sigmoid diverticulitis with contained perforation: Patient will be admitted to the hospital for IV antibiotics.  Also surgical consultation to assist with follow-up.  Pain management and nausea management.  Clear liquid diet for now.  Will follow surgical recommendations.  #2 essential hypertension: Monitor blood pressure and use home medication as necessary.  Currently blood pressure appears to contained.  #3 hypothyroidism: Continue with levothyroxine.  #4 hyperlipidemia: We will resume home regimen once stable.   DVT prophylaxis: Lovenox Code Status: Full code Family Communication: No family at bedside Disposition Plan: Home Consults called: Dr. Johney Maine: General surgery Admission status: Inpatient  Severity of Illness: The appropriate patient status for this patient is INPATIENT. Inpatient status is judged to be reasonable and necessary in order to provide the required intensity of service to ensure the patient's safety. The patient's presenting symptoms, physical exam findings, and initial radiographic and laboratory data in the context of their chronic comorbidities is felt to place them at high risk for further clinical deterioration. Furthermore, it is not anticipated that the patient will be medically stable for discharge  from the hospital within 2 midnights of admission. The following factors support the patient status of inpatient.   " The patient's presenting symptoms include abdominal and flank pain. " The worrisome physical exam findings include mild abdominal tenderness. " The initial radiographic and laboratory data are worrisome because of CT findings of diverticulitis. " The chronic co-morbidities include hypertension and previous diverticulosis.   * I certify that at the point of admission it is my clinical judgment that the patient will require inpatient hospital care spanning beyond 2 midnights from the point of admission due to high intensity of service, high risk for further deterioration and high frequency of surveillance required.Barbette Merino MD [facility] Pager [phone]  If 7PM-7AM, please contact night-coverage [email or web address] Password [facility]  01/29/2020, 6:43 PM

## 2020-01-29 NOTE — Plan of Care (Signed)
Plan of care discussed. NPO.

## 2020-01-29 NOTE — ED Provider Notes (Signed)
[facility] DEPT Provider Note   CSN: [id number] Arrival date & time: 01/29/20  1455     History Chief Complaint  Patient presents with  . Abdominal Pain    [name] is a 69 y.o. female.  69 year old female with complaint of right side abdominal pain onset Sunday night (4 days ago). Patient states she has been in bed since onset due to significant pain with walking, rolling over in bed or any movement. Reports chills when the pain is severe otherwise no fevers. Reports having watery diarrhea, now feels constipated, stools non bloody. Denies nausea, vomiting. Reports up to date on colonoscopies every 5 years due to family history of cancer, no history of diverticulitis, no prior abdominal surgeries, no other complaints or concerns.         Past Medical History:  Diagnosis Date  . Arthritis   . Blood in stool   . Colon polyps   . Hyperlipidemia   . Hypertension   . Thyroid disease     Patient Active Problem List   Diagnosis Date Noted  . Acute diverticulitis 01/29/2020  . Breast pain 01/15/2019  . Routine general medical examination at a health care facility 03/07/2016  . Lumbar back pain 06/29/2015  . Essential hypertension 10/31/2014  . Lipoma of arm 10/31/2014  . MDD (recurrent major depressive disorder) in remission ([place]) 10/31/2014  . Hypothyroidism 10/31/2014  . Hyperlipidemia 10/31/2014    Past Surgical History:  Procedure Laterality Date  . ABDOMINAL HYSTERECTOMY    . KNEE SURGERY Left 2018     OB History   No obstetric history on file.     Family History  Problem Relation Age of Onset  . Hypertension Mother   . Arthritis Sister   . Cancer Sister        breast and colon  . Diabetes Sister   . Diabetes Paternal Aunt   . Hypertension Paternal Grandmother     Social History   Tobacco Use  . Smoking status: Never Smoker  . Smokeless tobacco: Never Used  Substance Use Topics  . Alcohol use: No     Alcohol/week: 0.0 standard drinks  . Drug use: No    Home Medications Prior to Admission medications   Medication Sig Start Date End Date Taking? Authorizing Provider  B Complex-C (SUPER B COMPLEX PO) Take 1 tablet by mouth daily.    Yes [provider]  docusate sodium (COLACE) 100 MG capsule Take 100 mg by mouth 2 (two) times daily.    Yes [provider]  DULoxetine (CYMBALTA) 60 MG capsule TAKE ONE CAPSULE BY MOUTH DAILY 12/15/19  Yes Hoyt Koch, MD  fluticasone (FLONASE) 50 MCG/ACT nasal spray SHAKE LIQUID AND USE 2 SPRAYS IN EACH NOSTRIL DAILY Patient taking differently: Place 1 spray into both nostrils daily as needed for allergies.  06/20/18  Yes Hoyt Koch, MD  hydrochlorothiazide (HYDRODIURIL) 12.5 MG tablet TAKE 1 TABLET BY MOUTH DAILY. ANNUAL APPOINTMENT DUE IN [place]. MUST SEE PROVIDER FOR FUTURE REFILLS. Patient taking differently: Take 12.5 mg by mouth daily.  12/15/19  Yes Hoyt Koch, MD  loratadine (CLARITIN) 10 MG tablet Take 1 tablet (10 mg total) by mouth daily. Patient taking differently: Take 10 mg by mouth daily as needed for allergies.  10/09/16  Yes Hoyt Koch, MD  losartan (COZAAR) 50 MG tablet TAKE 1 TABLET BY MOUTH DAILY 12/15/19  Yes Hoyt Koch, MD  Multiple Vitamins-Minerals (MULTIVITAMIN WITH MINERALS)  tablet Take 1 tablet by mouth daily.   Yes [provider]  simvastatin (ZOCOR) 20 MG tablet TAKE 1 TABLET(20 MG) BY MOUTH DAILY Patient taking differently: Take 20 mg by mouth every evening.  09/16/19  Yes Hoyt Koch, MD  SYNTHROID 88 MCG tablet Take 88 mcg by mouth daily. 01/14/20  Yes [provider]  traZODone (DESYREL) 50 MG tablet TAKE 1/2 TO 2 TABLETS(25 TO 100 MG) BY MOUTH AT BEDTIME AS NEEDED FOR SLEEP Patient taking differently: Take 75 mg by mouth at bedtime.  11/06/19  Yes Hoyt Koch, MD  clobetasol cream (TEMOVATE) 0.05 % APPLY EXTERNALLY TO THE  AFFECTED AREA TWICE DAILY Patient not taking: Reported on 01/29/2020 06/28/17   Hoyt Koch, MD    Allergies    Patient has no known allergies.  Review of Systems   Review of Systems  Constitutional: Positive for chills. Negative for fever.  Respiratory: Negative for shortness of breath.   Cardiovascular: Negative for chest pain.  Gastrointestinal: Positive for abdominal pain, constipation and diarrhea. Negative for blood in stool, nausea and vomiting.  Genitourinary: Negative for dysuria and frequency.  Musculoskeletal: Positive for back pain. Negative for arthralgias and myalgias.  Skin: Negative for rash and wound.  Allergic/Immunologic: Negative for immunocompromised state.  Neurological: Negative for weakness.  All other systems reviewed and are negative.   Physical Exam Updated Vital Signs BP 138/79   Pulse 76   Temp 98.1 F (36.7 C) (Oral)   Resp (!) 21   SpO2 98%   Physical Exam Vitals and nursing note reviewed.  Constitutional:      General: She is not in acute distress.    Appearance: She is well-developed. She is not diaphoretic.  HENT:     Head: Normocephalic and atraumatic.  Cardiovascular:     Rate and Rhythm: Normal rate and regular rhythm.     Heart sounds: Normal heart sounds.  Pulmonary:     Effort: Pulmonary effort is normal.     Breath sounds: Normal breath sounds.  Abdominal:     Palpations: Abdomen is soft.     Tenderness: There is generalized abdominal tenderness. There is no right CVA tenderness or left CVA tenderness.  Musculoskeletal:       Back:  Skin:    General: Skin is warm and dry.     Findings: No erythema or rash.  Neurological:     Mental Status: She is alert and oriented to person, place, and time.  Psychiatric:        Behavior: Behavior normal.     ED Results / Procedures / Treatments   Labs (all labs ordered are listed, but only abnormal results are displayed) Labs Reviewed  COMPREHENSIVE METABOLIC PANEL -  Abnormal; Notable for the following components:      Result Value   Glucose, Bld 110 (*)    AST 14 (*)    All other components within normal limits  URINALYSIS, ROUTINE W REFLEX MICROSCOPIC - Abnormal; Notable for the following components:   Color, Urine AMBER (*)    APPearance CLOUDY (*)    Leukocytes,Ua TRACE (*)    Bacteria, UA FEW (*)    All other components within normal limits  RESP PANEL BY RT-PCR (FLU A&B, COVID) ARPGX2  LIPASE, BLOOD  CBC    EKG None  Radiology CT Abdomen Pelvis W Contrast  Result Date: 01/29/2020 CLINICAL DATA:  RIGHT abdominal pain.  Flank pain and back pain. EXAM: CT ABDOMEN AND PELVIS WITH  CONTRAST TECHNIQUE: Multidetector CT imaging of the abdomen and pelvis was performed using the standard protocol following bolus administration of intravenous contrast. CONTRAST:  159mL OMNIPAQUE IOHEXOL 300 MG/ML  SOLN COMPARISON:  MRI report 08/30/2001 FINDINGS: Lower chest: 4 mm nodule at the LEFT lung base (image 18/series 3). Hepatobiliary: Large peripheral enhancing lesion in the RIGHT hepatic lobe measuring 4.6 cm. This lesion has peripheral and central enhancement suggesting a benign hemangioma. By report, large benign hemangioma in the RIGHT hepatic lobe on MRI 2003. No additional hepatic lesion identified. Gallbladder normal. Common bile duct. Pancreas: Pancreas is normal. No ductal dilatation. No pancreatic inflammation. Spleen: Normal spleen Adrenals/urinary tract: Adrenal glands and kidneys are normal. The ureters and bladder normal. Stomach/Bowel: Stomach, small bowel, appendix, and cecum are normal. In the proximal sigmoid colon, there is a pericolonic inflammation within the sigmoid mesocolon. Additionally there is a curvilinear focus of extraluminal gas (image 94/6) along the sigmoid mesocolon extending towards the small bowel. This is through a region of several diverticula of the proximal sigmoid colon. Findings are most consistent with acute diverticulitis  with contained perforation. No abscess formation. More distal sigmoid colon rectosigmoid colon normal. Vascular/Lymphatic: Abdominal aorta is normal caliber. No periportal or retroperitoneal adenopathy. No pelvic adenopathy. Reproductive: Post hysterectomy.  Adnexa unremarkable Other: No free fluid. Musculoskeletal: No aggressive osseous lesion. IMPRESSION: 1. Acute diverticulitis of the proximal sigmoid colon with contained perforation. Moderate inflammation in the sigmoid mesocolon. No abscess formation. Recommend follow-up CT imaging to demonstrate resolution. 2. Large enhancing lesion in the RIGHT hepatic lobe is favored benign hemangioma. Benign hemangioma described within the RIGHT hepatic lobe on comparison MRI port in 2003. Electronically Signed   By: Suzy Bouchard M.D.   On: 01/29/2020 17:44    Procedures Procedures (including critical care time)  Medications Ordered in ED Medications  piperacillin-tazobactam (ZOSYN) IVPB 3.375 g (3.375 g Intravenous New Bag/Given 01/29/20 1815)  sodium chloride 0.9 % bolus 500 mL (0 mLs Intravenous Stopped 01/29/20 1814)  ondansetron (ZOFRAN) injection 4 mg (4 mg Intravenous Given 01/29/20 1649)  morphine 4 MG/ML injection 4 mg (4 mg Intravenous Given 01/29/20 1650)  iohexol (OMNIPAQUE) 300 MG/ML solution 100 mL (100 mLs Intravenous Contrast Given 01/29/20 1700)    ED Course  I have reviewed the triage vital signs and the nursing notes.  Pertinent labs & imaging results that were available during my care of the patient were reviewed by me and considered in my medical decision making (see chart for details).  Clinical Course as of Jan 28 1914  Thu Jan 28, 4150  959 69 year old female with right side abdominal pain onset 4 days ago. On exam, found to have generalized abdominal tenderness, right flank pain, no CVA tenderness.  Patient is afebrile. CBC, CMP, lipase WNL. UA with trace leukocytes, few bacteria and 21-50 RBC.  CT abdomen and pelvis  with contrast shows sigmoid diverticulitis with contained perforation, no abscess. Also incidental finding of larger hepatic lobe lesions, possible hemangioma, mentioned on MRI in 2003. IV antibiotics ordered, Flagyl IV is on national backorder, Zosyn ordered.  Discussed with Dr. Jonelle Sidle with [facility] service, requests consult with general surgery.    [LM]  1822 Discussed results and plan of care with patient.    [LM]  1913 Discussed with Dr. Johney Maine with general surgery who will review the chart with plan for general surgery to see the patient tomorrow, hospitalist to admit.    [LM]    Clinical Course User Index [LM] Percell Miller,  Sharen Hint   MDM Rules/Calculators/A&P                          Final Clinical Impression(s) / ED Diagnoses Final diagnoses:  Perforation of sigmoid colon due to diverticulitis    Rx / DC Orders ED Discharge Orders    None       Roque Lias 01/29/20 1915    Truddie Hidden, MD 01/29/20 2024

## 2020-01-29 NOTE — ED Notes (Signed)
ED TO INPATIENT HANDOFF REPORT  Name/Age/Gender [name] 69 y.o. female  Code Status   Home/SNF/Other Home  Chief Complaint Acute diverticulitis [K57.92]  Level of Care/Admitting Diagnosis ED Disposition    ED Disposition Condition [place]: [place] [100102]  Level of Care: Med-Surg [16]  May admit patient to [facility] or [facility] if equivalent level of care is available:: Yes  Covid Evaluation: Asymptomatic Screening Protocol (No Symptoms)  Diagnosis: Acute diverticulitis [[id number]]  Admitting Physician: Elwyn Reach [[id number]]  Attending Physician: Elwyn Reach [[id number]]  Estimated length of stay: past midnight tomorrow  Certification:: I certify this patient will need inpatient services for at least 2 midnights       Medical History Past Medical History:  Diagnosis Date  . Arthritis   . Blood in stool   . Colon polyps   . Hyperlipidemia   . Hypertension   . Thyroid disease     Allergies No Known Allergies  IV Location/Drains/Wounds Patient Lines/Drains/Airways Status    Active Line/Drains/Airways    Name Placement date Placement time Site Days   Peripheral IV 01/29/20 Left Antecubital 01/29/20  1641  Antecubital  less than 1          Labs/Imaging Results for orders placed or performed during the hospital encounter of 01/29/20 (from the past 48 hour(s))  Lipase, blood     Status: None   Collection Time: 01/29/20  3:25 PM  Result Value Ref Range   Lipase 27 11 - 51 U/L    Comment: Performed at [facility], [place] [address] [place], [place] [address]  Comprehensive metabolic panel     Status: Abnormal   Collection Time: 01/29/20  3:25 PM  Result Value Ref Range   Sodium 135 135 - 145 mmol/L   Potassium 3.6 3.5 - 5.1 mmol/L   Chloride 101 98 - 111 mmol/L   CO2 25 22 - 32 mmol/L   Glucose, Bld 110 (H) 70 - 99 mg/dL    Comment: Glucose reference range applies only to  samples taken after fasting for at least 8 hours.   BUN 14 8 - 23 mg/dL   Creatinine, Ser 0.59 0.44 - 1.00 mg/dL   Calcium 8.9 8.9 - 10.3 mg/dL   Total Protein 8.0 6.5 - 8.1 g/dL   Albumin 3.9 3.5 - 5.0 g/dL   AST 14 (L) 15 - 41 U/L   ALT 10 0 - 44 U/L   Alkaline Phosphatase 59 38 - 126 U/L   Total Bilirubin 0.6 0.3 - 1.2 mg/dL   GFR, Estimated >60 >60 mL/min    Comment: (NOTE) Calculated using the CKD-EPI Creatinine Equation (2021)    Anion gap 9 5 - 15    Comment: Performed at [facility], [place] [address] [place], [place] [address]  CBC     Status: None   Collection Time: 01/29/20  3:25 PM  Result Value Ref Range   WBC 8.8 4.0 - 10.5 K/uL   RBC 4.65 3.87 - 5.11 MIL/uL   Hemoglobin 14.1 12.0 - 15.0 g/dL   HCT 42.5 36 - 46 %   MCV 91.4 80.0 - 100.0 fL   MCH 30.3 26.0 - 34.0 pg   MCHC 33.2 30.0 - 36.0 g/dL   RDW 13.5 11.5 - 15.5 %   Platelets 244 150 - 400 K/uL   nRBC 0.0 0.0 - 0.2 %    Comment: Performed at [facility]  [place] [address], [place], [place] [address]  Urinalysis, Routine w reflex microscopic Urine, Clean Catch     Status: Abnormal   Collection Time: 01/29/20  4:14 PM  Result Value Ref Range   Color, Urine AMBER (A) YELLOW    Comment: BIOCHEMICALS MAY BE AFFECTED BY COLOR   APPearance CLOUDY (A) CLEAR   Specific Gravity, Urine 1.019 1.005 - 1.030   pH 5.0 5.0 - 8.0   Glucose, UA NEGATIVE NEGATIVE mg/dL   Hgb urine dipstick NEGATIVE NEGATIVE   Bilirubin Urine NEGATIVE NEGATIVE   Ketones, ur NEGATIVE NEGATIVE mg/dL   Protein, ur NEGATIVE NEGATIVE mg/dL   Nitrite NEGATIVE NEGATIVE   Leukocytes,Ua TRACE (A) NEGATIVE   RBC / HPF 21-50 0 - 5 RBC/hpf   WBC, UA 0-5 0 - 5 WBC/hpf   Bacteria, UA FEW (A) NONE SEEN   Squamous Epithelial / LPF 0-5 0 - 5   Mucus PRESENT     Comment: Performed at [facility], [place] [address], [place], [place] [address]  Resp Panel by RT-PCR (Flu A&B, Covid) Nasopharyngeal Swab      Status: None   Collection Time: 01/29/20  6:17 PM   Specimen: Nasopharyngeal Swab; Nasopharyngeal(NP) swabs in vial transport medium  Result Value Ref Range   SARS Coronavirus 2 by RT PCR NEGATIVE NEGATIVE    Comment: (NOTE) SARS-CoV-2 target nucleic acids are NOT DETECTED.  The SARS-CoV-2 RNA is generally detectable in upper respiratory specimens during the acute phase of infection. The lowest concentration of SARS-CoV-2 viral copies this assay can detect is 138 copies/mL. A negative result does not preclude SARS-Cov-2 infection and should not be used as the sole basis for treatment or other patient management decisions. A negative result may occur with  improper specimen collection/handling, submission of specimen other than nasopharyngeal swab, presence of viral mutation(s) within the areas targeted by this assay, and inadequate number of viral copies(<138 copies/mL). A negative result must be combined with clinical observations, patient history, and epidemiological information. The expected result is Negative.  Fact Sheet for Patients:  EntrepreneurPulse.com.au  Fact Sheet for Healthcare Providers:  IncredibleEmployment.be  This test is no t yet approved or cleared by the [place] FDA and  has been authorized for detection and/or diagnosis of SARS-CoV-2 by FDA under an Emergency Use Authorization (EUA). This EUA will remain  in effect (meaning this test can be used) for the duration of the COVID-19 declaration under Section 564(b)(1) of the Act, 21 U.S.C.section 360bbb-3(b)(1), unless the authorization is terminated  or revoked sooner.       Influenza A by PCR NEGATIVE NEGATIVE   Influenza B by PCR NEGATIVE NEGATIVE    Comment: (NOTE) The Xpert Xpress SARS-CoV-2/FLU/RSV plus assay is intended as an aid in the diagnosis of influenza from Nasopharyngeal swab specimens and should not be used as a sole basis for treatment. Nasal  washings and aspirates are unacceptable for Xpert Xpress SARS-CoV-2/FLU/RSV testing.  Fact Sheet for Patients: EntrepreneurPulse.com.au  Fact Sheet for Healthcare Providers: IncredibleEmployment.be  This test is not yet approved or cleared by the [place] FDA and has been authorized for detection and/or diagnosis of SARS-CoV-2 by FDA under an Emergency Use Authorization (EUA). This EUA will remain in effect (meaning this test can be used) for the duration of the COVID-19 declaration under Section 564(b)(1) of the Act, 21 U.S.C. section 360bbb-3(b)(1), unless the authorization is terminated or revoked.  Performed at [facility], [place] [address]  [place],  [address]    CT Abdomen Pelvis W Contrast  Result Date: 01/29/2020 CLINICAL DATA:  RIGHT abdominal pain.  Flank pain and back pain. EXAM: CT ABDOMEN AND PELVIS WITH CONTRAST TECHNIQUE: Multidetector CT imaging of the abdomen and pelvis was performed using the standard protocol following bolus administration of intravenous contrast. CONTRAST:  145mL OMNIPAQUE IOHEXOL 300 MG/ML  SOLN COMPARISON:  MRI report 08/30/2001 FINDINGS: Lower chest: 4 mm nodule at the LEFT lung base (image 18/series 3). Hepatobiliary: Large peripheral enhancing lesion in the RIGHT hepatic lobe measuring 4.6 cm. This lesion has peripheral and central enhancement suggesting a benign hemangioma. By report, large benign hemangioma in the RIGHT hepatic lobe on MRI 2003. No additional hepatic lesion identified. Gallbladder normal. Common bile duct. Pancreas: Pancreas is normal. No ductal dilatation. No pancreatic inflammation. Spleen: Normal spleen Adrenals/urinary tract: Adrenal glands and kidneys are normal. The ureters and bladder normal. Stomach/Bowel: Stomach, small bowel, appendix, and cecum are normal. In the proximal sigmoid colon, there is a pericolonic inflammation within the sigmoid mesocolon.  Additionally there is a curvilinear focus of extraluminal gas (image 94/6) along the sigmoid mesocolon extending towards the small bowel. This is through a region of several diverticula of the proximal sigmoid colon. Findings are most consistent with acute diverticulitis with contained perforation. No abscess formation. More distal sigmoid colon rectosigmoid colon normal. Vascular/Lymphatic: Abdominal aorta is normal caliber. No periportal or retroperitoneal adenopathy. No pelvic adenopathy. Reproductive: Post hysterectomy.  Adnexa unremarkable Other: No free fluid. Musculoskeletal: No aggressive osseous lesion. IMPRESSION: 1. Acute diverticulitis of the proximal sigmoid colon with contained perforation. Moderate inflammation in the sigmoid mesocolon. No abscess formation. Recommend follow-up CT imaging to demonstrate resolution. 2. Large enhancing lesion in the RIGHT hepatic lobe is favored benign hemangioma. Benign hemangioma described within the RIGHT hepatic lobe on comparison MRI port in 2003. Electronically Signed   By: Suzy Bouchard M.D.   On: 01/29/2020 17:44    Pending Labs Unresulted Labs (From admission, onward)          Start     Ordered   01/30/20 0500  CBC  Tomorrow morning,   R       Question:  Specimen collection method  Answer:  IV Team   01/29/20 1956   01/30/20 6568  Basic metabolic panel  Tomorrow morning,   R       Comments: If K < 3.5, give 6mEq KCl in 10MEq runs per protocol.  Pharmacy may adjust dosing strength, schedule, rate of infusion, etc as needed to optimize therapy   Question:  Specimen collection method  Answer:  IV Team   01/29/20 1956   01/29/20 1956  Prealbumin  Add-on,   AD       Question:  Specimen collection method  Answer:  IV Team   01/29/20 1956   01/29/20 1956  Magnesium  Add-on,   AD       Comments: If Mag Level is >2, it is adequateIf Mag level is 1.5-2, give 2g IV Magnesium sulfate x 1If Mag level < 1.4, give 4g  IV Magnesium sulfate x 1    Question:  Specimen collection method  Answer:  IV Team   01/29/20 1956   01/29/20 1956  Phosphorus  Add-on,   AD       Comments: If Phos >2.5, do nothingIf Phos 1.7 -2.5, give 56mmol NaPhos IV daily x 2 daysIf Phos <1.7, give 57mmol NaPhos IV BID x 2 days   Question:  Specimen collection  method  Answer:  IV Team   01/29/20 1956   Signed and Held  HIV Antibody (routine testing w rflx)  (HIV Antibody (Routine testing w reflex) panel)  Once,   R        Signed and Held   Signed and Held  CBC  (enoxaparin (LOVENOX)    CrCl >/= 30 ml/min)  Once,   R       Comments: Baseline for enoxaparin therapy IF NOT ALREADY DRAWN.  Notify MD if PLT < 100 K.    Signed and Held   Signed and Held  Creatinine, serum  (enoxaparin (LOVENOX)    CrCl >/= 30 ml/min)  Once,   R       Comments: Baseline for enoxaparin therapy IF NOT ALREADY DRAWN.    Signed and Held   Signed and Held  Creatinine, serum  (enoxaparin (LOVENOX)    CrCl >/= 30 ml/min)  Weekly,   R     Comments: while on enoxaparin therapy    Signed and Held   Signed and Held  CBC  Tomorrow morning,   R        Signed and Held   Signed and Held  Comprehensive metabolic panel  Tomorrow morning,   R        Signed and Held          Vitals/Pain Today's Vitals   01/29/20 1815 01/29/20 1830 01/29/20 1845 01/29/20 1951  BP: 136/77 134/74 138/79 (!) 120/58  Pulse: 79 76 76 71  Resp: 15 13 (!) 21 16  Temp:    98.1 F (36.7 C)  TempSrc:      SpO2: 96% 99% 98% 97%  PainSc:        Isolation Precautions No active isolations  Medications Medications  piperacillin-tazobactam (ZOSYN) IVPB 3.375 g (3.375 g Intravenous New Bag/Given 01/29/20 1815)  lactated ringers bolus 1,000 mL (has no administration in time range)  lactated ringers bolus 1,000 mL (has no administration in time range)  HYDROmorphone (DILAUDID) injection 0.5-2 mg (has no administration in time range)  methocarbamol (ROBAXIN) 1,000 mg in dextrose 5 % 100 mL IVPB (has no  administration in time range)  ondansetron (ZOFRAN) injection 4 mg (has no administration in time range)    Or  ondansetron (ZOFRAN) 8 mg in sodium chloride 0.9 % 50 mL IVPB (has no administration in time range)  prochlorperazine (COMPAZINE) injection 5-10 mg (has no administration in time range)  lip balm (CARMEX) ointment 1 application (has no administration in time range)  magic mouthwash (has no administration in time range)  diphenhydrAMINE (BENADRYL) injection 12.5-25 mg (has no administration in time range)  sodium chloride 0.9 % bolus 500 mL (0 mLs Intravenous Stopped 01/29/20 1814)  ondansetron (ZOFRAN) injection 4 mg (4 mg Intravenous Given 01/29/20 1649)  morphine 4 MG/ML injection 4 mg (4 mg Intravenous Given 01/29/20 1650)  iohexol (OMNIPAQUE) 300 MG/ML solution 100 mL (100 mLs Intravenous Contrast Given 01/29/20 1700)    Mobility walks

## 2020-01-29 NOTE — ED Triage Notes (Deleted)
Per pt, states she is out of her pain meds which is causing abdominal cramping-states she has pain meds"in the give box of her car and in her night stand" although she is claiming to have finished her pain meds days before she is able to get a refill-

## 2020-01-29 NOTE — ED Triage Notes (Signed)
Per patient, states right abdominal, flank and back pain since Sunday-states increased pain with movement-states PCP sent her to ED

## 2020-01-30 DIAGNOSIS — K5730 Diverticulosis of large intestine without perforation or abscess without bleeding: Secondary | ICD-10-CM

## 2020-01-30 DIAGNOSIS — K5904 Chronic idiopathic constipation: Secondary | ICD-10-CM

## 2020-01-30 LAB — COMPREHENSIVE METABOLIC PANEL
ALT: 8 U/L (ref 0–44)
AST: 12 U/L — ABNORMAL LOW (ref 15–41)
Albumin: 3 g/dL — ABNORMAL LOW (ref 3.5–5.0)
Alkaline Phosphatase: 49 U/L (ref 38–126)
Anion gap: 8 (ref 5–15)
BUN: 9 mg/dL (ref 8–23)
CO2: 25 mmol/L (ref 22–32)
Calcium: 8.3 mg/dL — ABNORMAL LOW (ref 8.9–10.3)
Chloride: 103 mmol/L (ref 98–111)
Creatinine, Ser: 0.58 mg/dL (ref 0.44–1.00)
GFR, Estimated: >60 mL/min (ref >60)
Glucose, Bld: 143 mg/dL — ABNORMAL HIGH (ref 70–99)
Potassium: 3.6 mmol/L (ref 3.5–5.1)
Sodium: 136 mmol/L (ref 135–145)
Total Bilirubin: 0.8 mg/dL (ref 0.3–1.2)
Total Protein: 6.3 g/dL — ABNORMAL LOW (ref 6.5–8.1)

## 2020-01-30 LAB — CBC
HCT: 35.7 % — ABNORMAL LOW (ref 36.0–46.0)
Hemoglobin: 11.9 g/dL — ABNORMAL LOW (ref 12.0–15.0)
MCH: 30.2 pg (ref 26.0–34.0)
MCHC: 33.3 g/dL (ref 30.0–36.0)
MCV: 90.6 fL (ref 80.0–100.0)
Platelets: 221 10*3/uL (ref 150–400)
RBC: 3.94 MIL/uL (ref 3.87–5.11)
RDW: 13.4 % (ref 11.5–15.5)
WBC: 6.7 10*3/uL (ref 4.0–10.5)
nRBC: 0 % (ref 0.0–0.2)

## 2020-01-30 MED ORDER — HYDRALAZINE HCL 20 MG/ML IJ SOLN: 10.0000 mg | Freq: Four times a day (QID) | INTRAMUSCULAR | Status: DC | PRN

## 2020-01-30 MED ORDER — HYDROMORPHONE HCL 1 MG/ML IJ SOLN: 0.5000 mg | INTRAMUSCULAR | Status: DC | PRN

## 2020-01-30 MED ORDER — PIPERACILLIN-TAZOBACTAM 3.375 G IVPB
3.3750 g | Freq: Three times a day (TID) | INTRAVENOUS | Status: DC
  Administered 2020-01-30 – 2020-01-31 (×3): 3.375 g via INTRAVENOUS
  Filled 2020-01-30 (×4): qty 50

## 2020-01-30 NOTE — Progress Notes (Signed)
[facility]                                                                              Patient Demographics  [name], is a 69 y.o. female, DOB - 06-06-1950, KVQ:[id number]  Admit date - 01/29/2020   Admitting Physician Elwyn Reach, MD  Outpatient Primary MD for the patient is Hoyt Koch, MD  Outpatient specialists:   LOS - 1  days   Medical records reviewed and are as summarized below:    Chief Complaint  Patient presents with   Abdominal Pain       Brief summary   Patient is a 69 year old female with history of hypertension, hypothyroidism, osteoarthritis, hyperlipidemia presented with 4 days of lower abdominal and right flank pain.  Pain was progressively getting worse, 6/10, with bouts of diarrhea and now constipated.  Patient had prior diverticulitis back in 2015 and has colonoscopies by Dr. Collene Mares.  Denied any colon surgery.  CT abdomen showed sigmoid diverticulitis with contained perforation.  Patient was admitted for further work-up, general surgery was consulted.   Assessment & Plan    Principal Problem: Acute diverticulitis of sigmoid colon with focal perforation -Continue IV fluids, IV antibiotics, antiemetics, pain medication -General surgery following, no evidence of abscess or peritonitis, continue clear liquids today and conservative management. -If she fails to progress, may require repeat CT scan in several days.   Active Problems:   Essential hypertension -Currently stable, continue IV hydralazine as needed with parameters  Hypothyroidism Continue Synthroid  Hyperlipidemia Resume statin once taking p.o. consistently  Large right hepatic lobe hemangioma -Chronic, present on MRI in 2003, outpatient follow-up    Code Status: Full code DVT Prophylaxis:  Lovenox  Family Communication: Discussed all imaging results, lab results, explained to the patient    Disposition Plan:     Status is: Inpatient  Remains  inpatient appropriate because:Inpatient level of care appropriate due to severity of illness   Dispo: The patient is from: Home              Anticipated d/c is to: Home              Anticipated d/c date is: > 3 days              Patient currently is not medically stable to d/c.  Until tolerating diet and cleared by general surgery      Time Spent in minutes   25 minutes  Procedures:  None  Consultants:   General surgery  Antimicrobials:   Anti-infectives (From admission, onward)   Start     Dose/Rate Route Frequency Ordered Stop   01/30/20 1400  piperacillin-tazobactam (ZOSYN) IVPB 3.375 g        3.375 g 12.5 mL/hr over 240 Minutes Intravenous Every 8 hours 01/30/20 1039     01/29/20 1815  piperacillin-tazobactam (ZOSYN) IVPB 3.375 g        3.375 g 12.5 mL/hr over 240 Minutes Intravenous Once 01/29/20 1801 01/29/20 2046          Medications  Scheduled Meds:  enoxaparin (LOVENOX) injection  40 mg Subcutaneous Q24H  lip balm  1 application Topical BID   Continuous Infusions:  dextrose 5% lactated ringers 125 mL/hr at 01/29/20 2150   lactated ringers     methocarbamol (ROBAXIN) IV     ondansetron (ZOFRAN) IV     piperacillin-tazobactam (ZOSYN)  IV 3.375 g (01/30/20 1458)   PRN Meds:.diphenhydrAMINE, HYDROmorphone (DILAUDID) injection, lactated ringers, magic mouthwash, methocarbamol (ROBAXIN) IV, morphine injection, ondansetron (ZOFRAN) IV **OR** ondansetron (ZOFRAN) IV, ondansetron **OR** [DISCONTINUED] ondansetron (ZOFRAN) IV, prochlorperazine      Subjective:   [name] was seen and examined today.  Still some pain and tenderness in the left lower quadrant and mid abdominal area.  No fevers or chills, no active nausea or vomiting. Patient denies dizziness, chest pain, shortness of breath.  No acute events overnight  Objective:   Vitals:   01/29/20 1951 01/29/20 2028 01/30/20 0035 01/30/20 0502  BP: (!) 120/58 130/68 113/64 111/66  Pulse: 71  67 69 64  Resp: 16 18 16 16   Temp: 98.1 F (36.7 C) 98.5 F (36.9 C) 98.2 F (36.8 C) 98 F (36.7 C)  TempSrc:  Oral Oral Oral  SpO2: 97% 98% 96% 97%  Weight:  77.5 kg    Height:  5\' 7"  (1.702 m)      Intake/Output Summary (Last 24 hours) at 01/30/2020 1504 Last data filed at 01/30/2020 0600 Gross per 24 hour  Intake 3534.37 ml  Output 600 ml  Net 2934.37 ml     Wt Readings from Last 3 Encounters:  01/29/20 77.5 kg  01/15/19 83.5 kg  12/24/18 82.1 kg     Exam  General: Alert and oriented x 3, NAD  Cardiovascular: S1 S2 auscultated, no murmurs, RRR  Respiratory: Clear to auscultation bilaterally, no wheezing, rales or rhonchi  Gastrointestinal: Soft, minimally tender in LLQ and mid abdomen  Ext: no pedal edema bilaterally  Neuro: no new deficits  Musculoskeletal: No digital cyanosis, clubbing  Skin: No rashes  Psych: Normal affect and demeanor, alert and oriented x3    Data Reviewed:  I have personally reviewed following labs and imaging studies  Micro Results Recent Results (from the past 240 hour(s))  Resp Panel by RT-PCR (Flu A&B, Covid) Nasopharyngeal Swab     Status: None   Collection Time: 01/29/20  6:17 PM   Specimen: Nasopharyngeal Swab; Nasopharyngeal(NP) swabs in vial transport medium  Result Value Ref Range Status   SARS Coronavirus 2 by RT PCR NEGATIVE NEGATIVE Final    Comment: (NOTE) SARS-CoV-2 target nucleic acids are NOT DETECTED.  The SARS-CoV-2 RNA is generally detectable in upper respiratory specimens during the acute phase of infection. The lowest concentration of SARS-CoV-2 viral copies this assay can detect is 138 copies/mL. A negative result does not preclude SARS-Cov-2 infection and should not be used as the sole basis for treatment or other patient management decisions. A negative result may occur with  improper specimen collection/handling, submission of specimen other than nasopharyngeal swab, presence of viral mutation(s)  within the areas targeted by this assay, and inadequate number of viral copies(<138 copies/mL). A negative result must be combined with clinical observations, patient history, and epidemiological information. The expected result is Negative.  Fact Sheet for Patients:  EntrepreneurPulse.com.au  Fact Sheet for Healthcare Providers:  IncredibleEmployment.be  This test is no t yet approved or cleared by the [place] FDA and  has been authorized for detection and/or diagnosis of SARS-CoV-2 by FDA under an Emergency Use Authorization (EUA). This EUA will remain  in effect (meaning this  test can be used) for the duration of the COVID-19 declaration under Section 564(b)(1) of the Act, 21 U.S.C.section 360bbb-3(b)(1), unless the authorization is terminated  or revoked sooner.       Influenza A by PCR NEGATIVE NEGATIVE Final   Influenza B by PCR NEGATIVE NEGATIVE Final    Comment: (NOTE) The Xpert Xpress SARS-CoV-2/FLU/RSV plus assay is intended as an aid in the diagnosis of influenza from Nasopharyngeal swab specimens and should not be used as a sole basis for treatment. Nasal washings and aspirates are unacceptable for Xpert Xpress SARS-CoV-2/FLU/RSV testing.  Fact Sheet for Patients: EntrepreneurPulse.com.au  Fact Sheet for Healthcare Providers: IncredibleEmployment.be  This test is not yet approved or cleared by the [place] FDA and has been authorized for detection and/or diagnosis of SARS-CoV-2 by FDA under an Emergency Use Authorization (EUA). This EUA will remain in effect (meaning this test can be used) for the duration of the COVID-19 declaration under Section 564(b)(1) of the Act, 21 U.S.C. section 360bbb-3(b)(1), unless the authorization is terminated or revoked.  Performed at [facility], [place] [address], [place], [place] [address]   Surgical PCR screen     Status:  None   Collection Time: 01/29/20  8:43 PM   Specimen: Nasal Mucosa; Nasal Swab  Result Value Ref Range Status   MRSA, PCR NEGATIVE NEGATIVE Final   Staphylococcus aureus NEGATIVE NEGATIVE Final    Comment: (NOTE) The Xpert SA Assay (FDA approved for NASAL specimens in patients 93 years of age and older), is one component of a comprehensive surveillance program. It is not intended to diagnose infection nor to guide or monitor treatment. Performed at [facility], [place] [address] [place], [place] [address]     Radiology Reports CT Abdomen Pelvis W Contrast  Result Date: 01/29/2020 CLINICAL DATA:  RIGHT abdominal pain.  Flank pain and back pain. EXAM: CT ABDOMEN AND PELVIS WITH CONTRAST TECHNIQUE: Multidetector CT imaging of the abdomen and pelvis was performed using the standard protocol following bolus administration of intravenous contrast. CONTRAST:  150mL OMNIPAQUE IOHEXOL 300 MG/ML  SOLN COMPARISON:  MRI report 08/30/2001 FINDINGS: Lower chest: 4 mm nodule at the LEFT lung base (image 18/series 3). Hepatobiliary: Large peripheral enhancing lesion in the RIGHT hepatic lobe measuring 4.6 cm. This lesion has peripheral and central enhancement suggesting a benign hemangioma. By report, large benign hemangioma in the RIGHT hepatic lobe on MRI 2003. No additional hepatic lesion identified. Gallbladder normal. Common bile duct. Pancreas: Pancreas is normal. No ductal dilatation. No pancreatic inflammation. Spleen: Normal spleen Adrenals/urinary tract: Adrenal glands and kidneys are normal. The ureters and bladder normal. Stomach/Bowel: Stomach, small bowel, appendix, and cecum are normal. In the proximal sigmoid colon, there is a pericolonic inflammation within the sigmoid mesocolon. Additionally there is a curvilinear focus of extraluminal gas (image 94/6) along the sigmoid mesocolon extending towards the small bowel. This is through a region of several diverticula of the proximal  sigmoid colon. Findings are most consistent with acute diverticulitis with contained perforation. No abscess formation. More distal sigmoid colon rectosigmoid colon normal. Vascular/Lymphatic: Abdominal aorta is normal caliber. No periportal or retroperitoneal adenopathy. No pelvic adenopathy. Reproductive: Post hysterectomy.  Adnexa unremarkable Other: No free fluid. Musculoskeletal: No aggressive osseous lesion. IMPRESSION: 1. Acute diverticulitis of the proximal sigmoid colon with contained perforation. Moderate inflammation in the sigmoid mesocolon. No abscess formation. Recommend follow-up CT imaging to demonstrate resolution. 2. Large enhancing lesion in the RIGHT hepatic lobe is favored benign hemangioma. Benign  hemangioma described within the RIGHT hepatic lobe on comparison MRI port in 2003. Electronically Signed   By: Suzy Bouchard M.D.   On: 01/29/2020 17:44    Lab Data:  CBC: Recent Labs  Lab 01/29/20 1525 01/29/20 2058 01/30/20 0353  WBC 8.8 8.8 6.7  HGB 14.1 12.5 11.9*  HCT 42.5 38.2 35.7*  MCV 91.4 91.8 90.6  PLT 244 220 782   Basic Metabolic Panel: Recent Labs  Lab 01/29/20 1525 01/29/20 2058 01/30/20 0353  NA 135  --  136  K 3.6  --  3.6  CL 101  --  103  CO2 25  --  25  GLUCOSE 110*  --  143*  BUN 14  --  9  CREATININE 0.59 0.61 0.58  CALCIUM 8.9  --  8.3*  MG 2.1  --   --   PHOS 2.7  --   --    GFR: Estimated Creatinine Clearance: 71.2 mL/min (by C-G formula based on SCr of 0.58 mg/dL). Liver Function Tests: Recent Labs  Lab 01/29/20 1525 01/30/20 0353  AST 14* 12*  ALT 10 8  ALKPHOS 59 49  BILITOT 0.6 0.8  PROT 8.0 6.3*  ALBUMIN 3.9 3.0*   Recent Labs  Lab 01/29/20 1525  LIPASE 27   No results for input(s): AMMONIA in the last 168 hours. Coagulation Profile: No results for input(s): INR, PROTIME in the last 168 hours. Cardiac Enzymes: No results for input(s): CKTOTAL, CKMB, CKMBINDEX, TROPONINI in the last 168 hours. BNP (last 3  results) No results for input(s): PROBNP in the last 8760 hours. HbA1C: No results for input(s): HGBA1C in the last 72 hours. CBG: No results for input(s): GLUCAP in the last 168 hours. Lipid Profile: No results for input(s): CHOL, HDL, LDLCALC, TRIG, CHOLHDL, LDLDIRECT in the last 72 hours. Thyroid Function Tests: Recent Labs    01/29/20 2058  TSH 4.518*   Anemia Panel: No results for input(s): VITAMINB12, FOLATE, FERRITIN, TIBC, IRON, RETICCTPCT in the last 72 hours. Urine analysis:    Component Value Date/Time   COLORURINE AMBER (A) 01/29/2020 1614   APPEARANCEUR CLOUDY (A) 01/29/2020 1614   LABSPEC 1.019 01/29/2020 1614   PHURINE 5.0 01/29/2020 1614   GLUCOSEU NEGATIVE 01/29/2020 1614   GLUCOSEU NEGATIVE 06/29/2015 1134   HGBUR NEGATIVE 01/29/2020 1614   BILIRUBINUR NEGATIVE 01/29/2020 [place] 01/29/2020 1614   PROTEINUR NEGATIVE 01/29/2020 1614   UROBILINOGEN 0.2 06/29/2015 1134   NITRITE NEGATIVE 01/29/2020 1614   LEUKOCYTESUR TRACE (A) 01/29/2020 1614     Zunaira Lamy M.D. [facility] 01/30/2020, 3:04 PM   Call night coverage person covering after 7pm

## 2020-01-30 NOTE — Progress Notes (Signed)
Subjective: Patient began having right flank and back pain last Friday.  She denies any N/V, but admits to some loose BMs this past Wednesday.  She admits to some chills, but no definite fevers.  She has never had diverticulitis before, but gets q 5 yr colonoscopies due to a family history of colon cancer.  She states these have all been normal except for a few polyps.  Her last one was 2-3 years ago she thinks.  She continues to complain of pain in her right flank and back, but also some pain in her mid central abdomen.  She called, Dr. Collene Mares who recommended ED evaluation, which revealed diverticulitis with a small contained microperforation but no evidence of abscess, etc. Her WBC is normal and she is AF.    Today she still mostly complains of this right side back and flank pain that radiates to her right groin.  She has some tenderness in her central abdomen.   ROS: See above, otherwise other systems negative  Objective: Vital signs in last 24 hours: Temp:  [98 F (36.7 C)-98.5 F (36.9 C)] 98 F (36.7 C) (11/19 0502) Pulse Rate:  [64-88] 64 (11/19 0502) Resp:  [11-21] 16 (11/19 0502) BP: (107-143)/(58-86) 111/66 (11/19 0502) SpO2:  [96 %-100 %] 97 % (11/19 0502) Weight:  [77.5 kg] 77.5 kg (11/18 2028) Last BM Date: 01/28/20  Intake/Output from previous day: 11/18 0701 - 11/19 0700 In: 3534.4 [I.V.:1000; IV Piggyback:2534.4] Out: 600 [Urine:600] Intake/Output this shift: No intake/output data recorded.  PE: Heart: regular Lungs: CRAB Abd: soft, minimally tender in LLQ and central/mid abdomen, +BS, ND Ext: MAE, no edema Psych: A&Ox3  Lab Results:  Recent Labs    01/29/20 2058 01/30/20 0353  WBC 8.8 6.7  HGB 12.5 11.9*  HCT 38.2 35.7*  PLT 220 221   BMET Recent Labs    01/29/20 1525 01/29/20 1525 01/29/20 2058 01/30/20 0353  NA 135  --   --  136  K 3.6  --   --  3.6  CL 101  --   --  103  CO2 25  --   --  25  GLUCOSE 110*  --   --  143*  BUN 14  --    --  9  CREATININE 0.59   < > 0.61 0.58  CALCIUM 8.9  --   --  8.3*   < > = values in this interval not displayed.   PT/INR No results for input(s): LABPROT, INR in the last 72 hours. CMP     Component Value Date/Time   NA 136 01/30/2020 0353   NA 143 09/01/2016 0000   K 3.6 01/30/2020 0353   CL 103 01/30/2020 0353   CO2 25 01/30/2020 0353   GLUCOSE 143 (H) 01/30/2020 0353   BUN 9 01/30/2020 0353   BUN 18 09/01/2016 0000   CREATININE 0.58 01/30/2020 0353   CALCIUM 8.3 (L) 01/30/2020 0353   PROT 6.3 (L) 01/30/2020 0353   ALBUMIN 3.0 (L) 01/30/2020 0353   AST 12 (L) 01/30/2020 0353   ALT 8 01/30/2020 0353   ALKPHOS 49 01/30/2020 0353   BILITOT 0.8 01/30/2020 0353   GFRNONAA >60 01/30/2020 0353   Lipase     Component Value Date/Time   LIPASE 27 01/29/2020 1525       Studies/Results: CT Abdomen Pelvis W Contrast  Result Date: 01/29/2020 CLINICAL DATA:  RIGHT abdominal pain.  Flank pain and back pain. EXAM: CT ABDOMEN AND  PELVIS WITH CONTRAST TECHNIQUE: Multidetector CT imaging of the abdomen and pelvis was performed using the standard protocol following bolus administration of intravenous contrast. CONTRAST:  178mL OMNIPAQUE IOHEXOL 300 MG/ML  SOLN COMPARISON:  MRI report 08/30/2001 FINDINGS: Lower chest: 4 mm nodule at the LEFT lung base (image 18/series 3). Hepatobiliary: Large peripheral enhancing lesion in the RIGHT hepatic lobe measuring 4.6 cm. This lesion has peripheral and central enhancement suggesting a benign hemangioma. By report, large benign hemangioma in the RIGHT hepatic lobe on MRI 2003. No additional hepatic lesion identified. Gallbladder normal. Common bile duct. Pancreas: Pancreas is normal. No ductal dilatation. No pancreatic inflammation. Spleen: Normal spleen Adrenals/urinary tract: Adrenal glands and kidneys are normal. The ureters and bladder normal. Stomach/Bowel: Stomach, small bowel, appendix, and cecum are normal. In the proximal sigmoid colon, there  is a pericolonic inflammation within the sigmoid mesocolon. Additionally there is a curvilinear focus of extraluminal gas (image 94/6) along the sigmoid mesocolon extending towards the small bowel. This is through a region of several diverticula of the proximal sigmoid colon. Findings are most consistent with acute diverticulitis with contained perforation. No abscess formation. More distal sigmoid colon rectosigmoid colon normal. Vascular/Lymphatic: Abdominal aorta is normal caliber. No periportal or retroperitoneal adenopathy. No pelvic adenopathy. Reproductive: Post hysterectomy.  Adnexa unremarkable Other: No free fluid. Musculoskeletal: No aggressive osseous lesion. IMPRESSION: 1. Acute diverticulitis of the proximal sigmoid colon with contained perforation. Moderate inflammation in the sigmoid mesocolon. No abscess formation. Recommend follow-up CT imaging to demonstrate resolution. 2. Large enhancing lesion in the RIGHT hepatic lobe is favored benign hemangioma. Benign hemangioma described within the RIGHT hepatic lobe on comparison MRI port in 2003. Electronically Signed   By: Suzy Bouchard M.D.   On: 01/29/2020 17:44    Anti-infectives: Anti-infectives (From admission, onward)   Start     Dose/Rate Route Frequency Ordered Stop   01/29/20 1815  piperacillin-tazobactam (ZOSYN) IVPB 3.375 g        3.375 g 12.5 mL/hr over 240 Minutes Intravenous Once 01/29/20 1801 01/29/20 2046       Assessment/Plan HTN Family hx of colon cancer Thyroid disease Large right hepatic lobe hemangioma - unclear if this is causing her right-sided pain.  Seems unlikely given she has had this on MRI seen in 2003.  No evidence of nephrolithiasis.  UA is relatively unremarkable  Diverticulitis with small contained microperforation -no evidence of abscess -no peritonitis -she has minimal pain in her left and mid abdomen.  Will allow CLD today -cont IV abx today -cont conservative management.  Unlikely she will  need escalation of care, but if she fails to progress she may require repeat CT scan in several days.  FEN - IVFs/CLD VTE - lovenox ID - [email or web address]   LOS: 1 day    Henreitta Cea , [facility] 01/30/2020, 10:30 AM Please see Amion for pager number during day hours 7:00am-4:30pm or 7:00am -11:30am on weekends

## 2020-01-31 DIAGNOSIS — Z800 Family history of malignant neoplasm of digestive organs: Secondary | ICD-10-CM

## 2020-01-31 DIAGNOSIS — F320 Major depressive disorder, single episode, mild: Secondary | ICD-10-CM

## 2020-01-31 MED ORDER — AMOXICILLIN-POT CLAVULANATE 875-125 MG PO TABS: 1.0000 | ORAL_TABLET | Freq: Two times a day (BID) | ORAL | 0 refills | Status: AC

## 2020-01-31 MED ORDER — LEVOTHYROXINE SODIUM 88 MCG PO TABS
88.0000 ug | ORAL_TABLET | Freq: Every day | ORAL | Status: DC
  Administered 2020-01-31: 88 ug via ORAL
  Filled 2020-01-31: qty 1

## 2020-01-31 MED ORDER — TRAMADOL HCL 50 MG PO TABS
50.0000 mg | ORAL_TABLET | Freq: Three times a day (TID) | ORAL | 0 refills | Status: DC | PRN
Start: 2020-01-31 — End: 2020-02-27

## 2020-01-31 MED ORDER — ONDANSETRON 4 MG PO TBDP: 4.0000 mg | ORAL_TABLET | Freq: Three times a day (TID) | ORAL | 0 refills | Status: DC | PRN

## 2020-01-31 NOTE — Discharge Summary (Signed)
Physician Discharge Summary   Patient ID: [name] MRN: [id number] DOB/AGE: Feb 25, 1951 69 y.o.  Admit date: 01/29/2020 Discharge date: 01/31/2020  Primary Care Physician:  Hoyt Koch, MD   Recommendations for Outpatient Follow-up:  1. Follow up with PCP in 1-2 weeks 2. Patient recommended to follow-up with general surgery and gastroenterology, Dr. Collene Mares outpatient 3. Augmentin 875-125 1 tab p.o. twice daily for 10 days   Home Health: None  Equipment/Devices:   Discharge Condition: stable  CODE STATUS: FULL  Diet recommendation: Soft diet   Discharge Diagnoses:    . Acute diverticulitis of sigmoid colon with focal perforation . Essential hypertension .  hyperlipidemia . Hypothyroidism . chronic Lumbar back pain . Diverticulosis of colon . Chronic idiopathic constipation . Generalized anxiety disorder   Consults: General surgery, Dr. Johney Maine    Allergies:  No Known Allergies   DISCHARGE MEDICATIONS: Allergies as of 01/31/2020   No Known Allergies     Medication List    TAKE these medications   amoxicillin-clavulanate 875-125 MG tablet Commonly known as: Augmentin Take 1 tablet by mouth 2 (two) times daily for 10 days.   clobetasol cream 0.05 % Commonly known as: TEMOVATE APPLY EXTERNALLY TO THE AFFECTED AREA TWICE DAILY   docusate sodium 100 MG capsule Commonly known as: COLACE Take 100 mg by mouth 2 (two) times daily.   DULoxetine 60 MG capsule Commonly known as: CYMBALTA TAKE ONE CAPSULE BY MOUTH DAILY   fluticasone 50 MCG/ACT nasal spray Commonly known as: FLONASE SHAKE LIQUID AND USE 2 SPRAYS IN EACH NOSTRIL DAILY What changed: See the new instructions.   hydrochlorothiazide 12.5 MG tablet Commonly known as: HYDRODIURIL TAKE 1 TABLET BY MOUTH DAILY. ANNUAL APPOINTMENT DUE IN [place]. MUST SEE PROVIDER FOR FUTURE REFILLS. What changed: See the new instructions.   loratadine 10 MG tablet Commonly known as: CLARITIN Take  1 tablet (10 mg total) by mouth daily. What changed:   when to take this  reasons to take this   losartan 50 MG tablet Commonly known as: COZAAR TAKE 1 TABLET BY MOUTH DAILY   multivitamin with minerals tablet Take 1 tablet by mouth daily.   ondansetron 4 MG disintegrating tablet Commonly known as: Zofran ODT Take 1 tablet (4 mg total) by mouth every 8 (eight) hours as needed for nausea or vomiting.   simvastatin 20 MG tablet Commonly known as: ZOCOR TAKE 1 TABLET(20 MG) BY MOUTH DAILY What changed: See the new instructions.   SUPER B COMPLEX PO Take 1 tablet by mouth daily.   Synthroid 88 MCG tablet Generic drug: levothyroxine Take 88 mcg by mouth daily.   traMADol 50 MG tablet Commonly known as: Ultram Take 1 tablet (50 mg total) by mouth every 8 (eight) hours as needed for moderate pain or severe pain.   traZODone 50 MG tablet Commonly known as: DESYREL TAKE 1/2 TO 2 TABLETS(25 TO 100 MG) BY MOUTH AT BEDTIME AS NEEDED FOR SLEEP What changed: See the new instructions.        Brief H and P: For complete details please refer to admission H and P, but in brief Patient is a 69 year old female with history of hypertension, hypothyroidism, osteoarthritis, hyperlipidemia presented with 4 days of lower abdominal and right flank pain.  Pain was progressively getting worse, 6/10, with bouts of diarrhea and now constipated.  Patient had prior diverticulitis back in 2015 and has colonoscopies by Dr. Collene Mares.  Denied any colon surgery.  CT abdomen showed sigmoid diverticulitis with contained  perforation.  Patient was admitted for further work-up, general surgery was consulted.   Hospital Course:   Acute diverticulitis of sigmoid colon with focal perforation -Patient was placed on n.p.o. status, IV fluids, IV Zosyn  antiemetics, pain medication -General surgery was consulted, no  evidence of abscess or peritonitis, hence does not need any surgical intervention at this time,  recommended conservative management  -Patient improved with IV antibiotics, recommended transition to oral Augmentin for 10 days.  She will follow up with general surgery in 2 to 3 weeks outpatient - also recommended repeat colonoscopy, follows Dr. Collene Mares     Essential hypertension -Currently stable, continue losartan, HCTZ  Hypothyroidism Continue Synthroid  Hyperlipidemia Continue statin  Large right hepatic lobe hemangioma -Chronic, present on MRI in 2003, outpatient follow-up  Hypothyroidism Continue Synthroid, TSH 4.5   Day of Discharge S: No nausea vomiting or abdominal pain, tolerating solid diet at the time of examination.  Looking forward to discharge home today.  BP 119/75 (BP Location: Right Arm)   Pulse 67   Temp 98.1 F (36.7 C) (Oral)   Resp 15   Ht 5\' 7"  (1.702 m)   Wt 77.5 kg   SpO2 99%   BMI 26.76 kg/m   Physical Exam: General: Alert and awake oriented x3 not in any acute distress. HEENT: anicteric sclera, pupils reactive to light and accommodation CVS: S1-S2 clear no murmur rubs or gallops Chest: clear to auscultation bilaterally, no wheezing rales or rhonchi Abdomen: soft nontender, nondistended, normal bowel sounds Extremities: no cyanosis, clubbing or edema noted bilaterally Neuro: Cranial nerves II-XII intact, no focal neurological deficits    Get Medicines reviewed and adjusted: Please take all your medications with you for your next visit with your Primary MD  Please request your Primary MD to go over all hospital tests and procedure/radiological results at the follow up. Please ask your Primary MD to get all Hospital records sent to his/her office.  If you experience worsening of your admission symptoms, develop shortness of breath, life threatening emergency, suicidal or homicidal thoughts you must seek medical attention immediately by calling 911 or calling your MD immediately  if symptoms less severe.  You must read complete  instructions/literature along with all the possible adverse reactions/side effects for all the Medicines you take and that have been prescribed to you. Take any new Medicines after you have completely understood and accept all the possible adverse reactions/side effects.   Do not drive when taking pain medications.   Do not take more than prescribed Pain, Sleep and Anxiety Medications  Special Instructions: If you have smoked or chewed Tobacco  in the last 2 yrs please stop smoking, stop any regular Alcohol  and or any Recreational drug use.  Wear Seat belts while driving.  Please note  You were cared for by a hospitalist during your hospital stay. Once you are discharged, your primary care physician will handle any further medical issues. Please note that NO REFILLS for any discharge medications will be authorized once you are discharged, as it is imperative that you return to your primary care physician (or establish a relationship with a primary care physician if you do not have one) for your aftercare needs so that they can reassess your need for medications and monitor your lab values.   The results of significant diagnostics from this hospitalization (including imaging, microbiology, ancillary and laboratory) are listed below for reference.      Procedures/Studies:  CT Abdomen Pelvis W Contrast  Result  Date: 01/29/2020 CLINICAL DATA:  RIGHT abdominal pain.  Flank pain and back pain. EXAM: CT ABDOMEN AND PELVIS WITH CONTRAST TECHNIQUE: Multidetector CT imaging of the abdomen and pelvis was performed using the standard protocol following bolus administration of intravenous contrast. CONTRAST:  160mL OMNIPAQUE IOHEXOL 300 MG/ML  SOLN COMPARISON:  MRI report 08/30/2001 FINDINGS: Lower chest: 4 mm nodule at the LEFT lung base (image 18/series 3). Hepatobiliary: Large peripheral enhancing lesion in the RIGHT hepatic lobe measuring 4.6 cm. This lesion has peripheral and central enhancement  suggesting a benign hemangioma. By report, large benign hemangioma in the RIGHT hepatic lobe on MRI 2003. No additional hepatic lesion identified. Gallbladder normal. Common bile duct. Pancreas: Pancreas is normal. No ductal dilatation. No pancreatic inflammation. Spleen: Normal spleen Adrenals/urinary tract: Adrenal glands and kidneys are normal. The ureters and bladder normal. Stomach/Bowel: Stomach, small bowel, appendix, and cecum are normal. In the proximal sigmoid colon, there is a pericolonic inflammation within the sigmoid mesocolon. Additionally there is a curvilinear focus of extraluminal gas (image 94/6) along the sigmoid mesocolon extending towards the small bowel. This is through a region of several diverticula of the proximal sigmoid colon. Findings are most consistent with acute diverticulitis with contained perforation. No abscess formation. More distal sigmoid colon rectosigmoid colon normal. Vascular/Lymphatic: Abdominal aorta is normal caliber. No periportal or retroperitoneal adenopathy. No pelvic adenopathy. Reproductive: Post hysterectomy.  Adnexa unremarkable Other: No free fluid. Musculoskeletal: No aggressive osseous lesion. IMPRESSION: 1. Acute diverticulitis of the proximal sigmoid colon with contained perforation. Moderate inflammation in the sigmoid mesocolon. No abscess formation. Recommend follow-up CT imaging to demonstrate resolution. 2. Large enhancing lesion in the RIGHT hepatic lobe is favored benign hemangioma. Benign hemangioma described within the RIGHT hepatic lobe on comparison MRI port in 2003. Electronically Signed   By: Suzy Bouchard M.D.   On: 01/29/2020 17:44       LAB RESULTS: Basic Metabolic Panel: Recent Labs  Lab 01/29/20 1525 01/29/20 1525 01/29/20 2058 01/30/20 0353  NA 135  --   --  136  K 3.6  --   --  3.6  CL 101  --   --  103  CO2 25  --   --  25  GLUCOSE 110*  --   --  143*  BUN 14  --   --  9  CREATININE 0.59   < > 0.61 0.58  CALCIUM  8.9  --   --  8.3*  MG 2.1  --   --   --   PHOS 2.7  --   --   --    < > = values in this interval not displayed.   Liver Function Tests: Recent Labs  Lab 01/29/20 1525 01/30/20 0353  AST 14* 12*  ALT 10 8  ALKPHOS 59 49  BILITOT 0.6 0.8  PROT 8.0 6.3*  ALBUMIN 3.9 3.0*   Recent Labs  Lab 01/29/20 1525  LIPASE 27   No results for input(s): AMMONIA in the last 168 hours. CBC: Recent Labs  Lab 01/29/20 2058 01/29/20 2058 01/30/20 0353  WBC 8.8  --  6.7  HGB 12.5  --  11.9*  HCT 38.2  --  35.7*  MCV 91.8   < > 90.6  PLT 220  --  221   < > = values in this interval not displayed.   Cardiac Enzymes: No results for input(s): CKTOTAL, CKMB, CKMBINDEX, TROPONINI in the last 168 hours. BNP: Invalid input(s): POCBNP CBG: No results for input(s):  GLUCAP in the last 168 hours.     Disposition and Follow-up: Discharge Instructions    Diet - low sodium heart healthy   Complete by: As directed    Increase activity slowly   Complete by: As directed        DISPOSITION: [facility], [place]. Schedule an appointment as soon as possible for a visit in 3 week(s).   Specialty: General Surgery Contact information: [address] [place] [phone]       Juanita Craver, MD. Schedule an appointment as soon as possible for a visit in 2 week(s).   Specialty: Gastroenterology Contact information: [address], [place] [address] [phone]        Hoyt Koch, MD Follow up in 2 week(s).   Specialty: Internal Medicine Contact information: [place] [address] [phone]                Time coordinating discharge:  35 minutes  Signed:   Estill Cotta M.D. [facility] 01/31/2020, 1:13 PM

## 2020-01-31 NOTE — Progress Notes (Signed)
Pt tolerated soft diet, no c/o nausea or vomited. MD is paged and recommended to discharge the Pt. All discharge instructions were given to the Pt. All questions were answered.

## 2020-01-31 NOTE — Progress Notes (Signed)
Subjective/Chief Complaint: No pain today, tol clears   Objective: Vital signs in last 24 hours: Temp:  [98.1 F (36.7 C)-98.7 F (37.1 C)] 98.1 F (36.7 C) (11/20 0653) Pulse Rate:  [66-67] 67 (11/20 0653) Resp:  [15-19] 15 (11/20 0653) BP: (118-119)/(55-75) 119/75 (11/20 0653) SpO2:  [97 %-99 %] 99 % (11/20 0653) Last BM Date: 01/28/20  Intake/Output from previous day: 11/19 0701 - 11/20 0700 In: 2276.1 [P.O.:720; I.V.:1500; IV Piggyback:56.1] Out: -  Intake/Output this shift: No intake/output data recorded.  GI: soft nontender nondistended bs present  Lab Results:  Recent Labs    01/29/20 2058 01/30/20 0353  WBC 8.8 6.7  HGB 12.5 11.9*  HCT 38.2 35.7*  PLT 220 221   BMET Recent Labs    01/29/20 1525 01/29/20 1525 01/29/20 2058 01/30/20 0353  NA 135  --   --  136  K 3.6  --   --  3.6  CL 101  --   --  103  CO2 25  --   --  25  GLUCOSE 110*  --   --  143*  BUN 14  --   --  9  CREATININE 0.59   < > 0.61 0.58  CALCIUM 8.9  --   --  8.3*   < > = values in this interval not displayed.   PT/INR No results for input(s): LABPROT, INR in the last 72 hours. ABG No results for input(s): PHART, HCO3 in the last 72 hours.  Invalid input(s): PCO2, PO2  Studies/Results: CT Abdomen Pelvis W Contrast  Result Date: 01/29/2020 CLINICAL DATA:  RIGHT abdominal pain.  Flank pain and back pain. EXAM: CT ABDOMEN AND PELVIS WITH CONTRAST TECHNIQUE: Multidetector CT imaging of the abdomen and pelvis was performed using the standard protocol following bolus administration of intravenous contrast. CONTRAST:  18mL OMNIPAQUE IOHEXOL 300 MG/ML  SOLN COMPARISON:  MRI report 08/30/2001 FINDINGS: Lower chest: 4 mm nodule at the LEFT lung base (image 18/series 3). Hepatobiliary: Large peripheral enhancing lesion in the RIGHT hepatic lobe measuring 4.6 cm. This lesion has peripheral and central enhancement suggesting a benign hemangioma. By report, large benign hemangioma in the  RIGHT hepatic lobe on MRI 2003. No additional hepatic lesion identified. Gallbladder normal. Common bile duct. Pancreas: Pancreas is normal. No ductal dilatation. No pancreatic inflammation. Spleen: Normal spleen Adrenals/urinary tract: Adrenal glands and kidneys are normal. The ureters and bladder normal. Stomach/Bowel: Stomach, small bowel, appendix, and cecum are normal. In the proximal sigmoid colon, there is a pericolonic inflammation within the sigmoid mesocolon. Additionally there is a curvilinear focus of extraluminal gas (image 94/6) along the sigmoid mesocolon extending towards the small bowel. This is through a region of several diverticula of the proximal sigmoid colon. Findings are most consistent with acute diverticulitis with contained perforation. No abscess formation. More distal sigmoid colon rectosigmoid colon normal. Vascular/Lymphatic: Abdominal aorta is normal caliber. No periportal or retroperitoneal adenopathy. No pelvic adenopathy. Reproductive: Post hysterectomy.  Adnexa unremarkable Other: No free fluid. Musculoskeletal: No aggressive osseous lesion. IMPRESSION: 1. Acute diverticulitis of the proximal sigmoid colon with contained perforation. Moderate inflammation in the sigmoid mesocolon. No abscess formation. Recommend follow-up CT imaging to demonstrate resolution. 2. Large enhancing lesion in the RIGHT hepatic lobe is favored benign hemangioma. Benign hemangioma described within the RIGHT hepatic lobe on comparison MRI port in 2003. Electronically Signed   By: Suzy Bouchard M.D.   On: 01/29/2020 17:44    Anti-infectives: Anti-infectives (From admission, onward)   Start  Dose/Rate Route Frequency Ordered Stop   01/30/20 1400  piperacillin-tazobactam (ZOSYN) IVPB 3.375 g        3.375 g 12.5 mL/hr over 240 Minutes Intravenous Every 8 hours 01/30/20 1039     01/29/20 1815  piperacillin-tazobactam (ZOSYN) IVPB 3.375 g        3.375 g 12.5 mL/hr over 240 Minutes Intravenous  Once 01/29/20 1801 01/29/20 2046      Assessment/Plan: Diverticulitis with small contained microperforation -she has normal wbc, nontender abdomen -advance to soft diet -can dc home later today per [facility] as long as tolerates diet and transition to 10day course augmentin -will have her see [place] in office -states she has had csc before FEN - IVFs/adv diet VTE - lovenox ID - [email or web address]  Rolm Bookbinder 01/31/2020

## 2020-01-31 NOTE — Plan of Care (Signed)
  Problem: Education: Goal: Knowledge of General Education information will improve Description: Including pain rating scale, medication(s)/side effects and non-pharmacologic comfort measures Outcome: Progressing   Problem: Clinical Measurements: Goal: Ability to maintain clinical measurements within normal limits will improve Outcome: Progressing   Problem: Clinical Measurements: Goal: Respiratory complications will improve Outcome: Progressing   Problem: Clinical Measurements: Goal: Cardiovascular complication will be avoided Outcome: Progressing   

## 2020-02-02 ENCOUNTER — Telehealth: Payer: Self-pay

## 2020-02-02 NOTE — Telephone Encounter (Signed)
Transition Care Management Unsuccessful Follow-up Telephone Call  Date of discharge and from where:  01/31/2020 from [facility]  Attempts:  1st Attempt  Reason for unsuccessful TCM follow-up call:  Left voice message

## 2020-02-03 ENCOUNTER — Other Ambulatory Visit: Payer: Self-pay | Admitting: Internal Medicine

## 2020-02-03 ENCOUNTER — Telehealth: Payer: Self-pay

## 2020-02-03 NOTE — Telephone Encounter (Signed)
Patient calling back, call her back at [phone]

## 2020-02-03 NOTE — Telephone Encounter (Cosign Needed)
Returned patient's call.  Advised patient that I needed to go over her discharge from the hospital on 01/31/2020.  Patient was to follow-up with Dr. Sharlet Salina in 2 weeks.  Patient declined hospital f/u and stated that she will keep her scheduled appt with Dr. Juanita Craver for 02/10/2020 at 11:00 am and her CPE with Dr. Sharlet Salina on 02/27/2020 at 10:00 am.

## 2020-02-10 DIAGNOSIS — K5730 Diverticulosis of large intestine without perforation or abscess without bleeding: Secondary | ICD-10-CM | POA: Diagnosis not present

## 2020-02-10 DIAGNOSIS — Z800 Family history of malignant neoplasm of digestive organs: Secondary | ICD-10-CM | POA: Diagnosis not present

## 2020-02-10 DIAGNOSIS — Z1211 Encounter for screening for malignant neoplasm of colon: Secondary | ICD-10-CM | POA: Diagnosis not present

## 2020-02-10 DIAGNOSIS — R933 Abnormal findings on diagnostic imaging of other parts of digestive tract: Secondary | ICD-10-CM | POA: Diagnosis not present

## 2020-02-11 DIAGNOSIS — E559 Vitamin D deficiency, unspecified: Secondary | ICD-10-CM | POA: Diagnosis not present

## 2020-02-11 DIAGNOSIS — E039 Hypothyroidism, unspecified: Secondary | ICD-10-CM | POA: Diagnosis not present

## 2020-02-11 DIAGNOSIS — I10 Essential (primary) hypertension: Secondary | ICD-10-CM | POA: Diagnosis not present

## 2020-02-13 DIAGNOSIS — H10413 Chronic giant papillary conjunctivitis, bilateral: Secondary | ICD-10-CM | POA: Diagnosis not present

## 2020-02-13 DIAGNOSIS — H5213 Myopia, bilateral: Secondary | ICD-10-CM | POA: Diagnosis not present

## 2020-02-27 ENCOUNTER — Encounter: Payer: Self-pay | Admitting: Internal Medicine

## 2020-02-27 ENCOUNTER — Other Ambulatory Visit: Payer: Self-pay

## 2020-02-27 ENCOUNTER — Ambulatory Visit (INDEPENDENT_AMBULATORY_CARE_PROVIDER_SITE_OTHER): Payer: Medicare PPO | Admitting: Internal Medicine

## 2020-02-27 VITALS — BP 124/90 | HR 84 | Temp 98.3°F | Ht 67.0 in | Wt 170.2 lb

## 2020-02-27 DIAGNOSIS — I10 Essential (primary) hypertension: Secondary | ICD-10-CM

## 2020-02-27 DIAGNOSIS — E039 Hypothyroidism, unspecified: Secondary | ICD-10-CM

## 2020-02-27 DIAGNOSIS — F411 Generalized anxiety disorder: Secondary | ICD-10-CM

## 2020-02-27 DIAGNOSIS — R7301 Impaired fasting glucose: Secondary | ICD-10-CM | POA: Diagnosis not present

## 2020-02-27 DIAGNOSIS — E782 Mixed hyperlipidemia: Secondary | ICD-10-CM | POA: Diagnosis not present

## 2020-02-27 DIAGNOSIS — F330 Major depressive disorder, recurrent, mild: Secondary | ICD-10-CM

## 2020-02-27 DIAGNOSIS — K5720 Diverticulitis of large intestine with perforation and abscess without bleeding: Secondary | ICD-10-CM | POA: Diagnosis not present

## 2020-02-27 DIAGNOSIS — Z0000 Encounter for general adult medical examination without abnormal findings: Secondary | ICD-10-CM

## 2020-02-27 LAB — LIPID PANEL
Cholesterol: 176 mg/dL (ref 0–200)
HDL: 61.1 mg/dL (ref >39.00)
LDL Cholesterol: 105 mg/dL — ABNORMAL HIGH (ref 0–99)
NonHDL: 114.45
Total CHOL/HDL Ratio: 3
Triglycerides: 45 mg/dL (ref 0.0–149.0)
VLDL: 9 mg/dL (ref 0.0–40.0)

## 2020-02-27 LAB — COMPREHENSIVE METABOLIC PANEL
ALT: 8 U/L (ref 0–35)
AST: 13 U/L (ref 0–37)
Albumin: 4 g/dL (ref 3.5–5.2)
Alkaline Phosphatase: 65 U/L (ref 39–117)
BUN: 18 mg/dL (ref 6–23)
CO2: 28 mEq/L (ref 19–32)
Calcium: 9.3 mg/dL (ref 8.4–10.5)
Chloride: 104 mEq/L (ref 96–112)
Creatinine, Ser: 0.81 mg/dL (ref 0.40–1.20)
GFR: 73.81 mL/min (ref >60.00)
Glucose, Bld: 92 mg/dL (ref 70–99)
Potassium: 4 mEq/L (ref 3.5–5.1)
Sodium: 137 mEq/L (ref 135–145)
Total Bilirubin: 0.5 mg/dL (ref 0.2–1.2)
Total Protein: 7.6 g/dL (ref 6.0–8.3)

## 2020-02-27 LAB — CBC
HCT: 39.5 % (ref 36.0–46.0)
Hemoglobin: 13.4 g/dL (ref 12.0–15.0)
MCHC: 34 g/dL (ref 30.0–36.0)
MCV: 88.2 fl (ref 78.0–100.0)
Platelets: 216 10*3/uL (ref 150.0–400.0)
RBC: 4.47 Mil/uL (ref 3.87–5.11)
RDW: 14.5 % (ref 11.5–15.5)
WBC: 4.9 10*3/uL (ref 4.0–10.5)

## 2020-02-27 LAB — HEMOGLOBIN A1C: Hgb A1c MFr Bld: 5.7 % (ref 4.6–6.5)

## 2020-02-27 NOTE — Patient Instructions (Addendum)
Take the metamucil every day to help.  Health Maintenance, Female Adopting a healthy lifestyle and getting preventive care are important in promoting health and wellness. Ask your health care provider about:  The right schedule for you to have regular tests and exams.  Things you can do on your own to prevent diseases and keep yourself healthy. What should I know about diet, weight, and exercise? Eat a healthy diet   Eat a diet that includes plenty of vegetables, fruits, low-fat dairy products, and lean protein.  Do not eat a lot of foods that are high in solid fats, added sugars, or sodium. Maintain a healthy weight Body mass index (BMI) is used to identify weight problems. It estimates body fat based on height and weight. Your health care provider can help determine your BMI and help you achieve or maintain a healthy weight. Get regular exercise Get regular exercise. This is one of the most important things you can do for your health. Most adults should:  Exercise for at least 150 minutes each week. The exercise should increase your heart rate and make you sweat (moderate-intensity exercise).  Do strengthening exercises at least twice a week. This is in addition to the moderate-intensity exercise.  Spend less time sitting. Even light physical activity can be beneficial. Watch cholesterol and blood lipids Have your blood tested for lipids and cholesterol at 69 years of age, then have this test every 5 years. Have your cholesterol levels checked more often if:  Your lipid or cholesterol levels are high.  You are older than 69 years of age.  You are at high risk for heart disease. What should I know about cancer screening? Depending on your health history and family history, you may need to have cancer screening at various ages. This may include screening for:  Breast cancer.  Cervical cancer.  Colorectal cancer.  Skin cancer.  Lung cancer. What should I know about heart  disease, diabetes, and high blood pressure? Blood pressure and heart disease  High blood pressure causes heart disease and increases the risk of stroke. This is more likely to develop in people who have high blood pressure readings, are of African descent, or are overweight.  Have your blood pressure checked: ? Every 3-5 years if you are 57-12 years of age. ? Every year if you are 33 years old or older. Diabetes Have regular diabetes screenings. This checks your fasting blood sugar level. Have the screening done:  Once every three years after age 68 if you are at a normal weight and have a low risk for diabetes.  More often and at a younger age if you are overweight or have a high risk for diabetes. What should I know about preventing infection? Hepatitis B If you have a higher risk for hepatitis B, you should be screened for this virus. Talk with your health care provider to find out if you are at risk for hepatitis B infection. Hepatitis C Testing is recommended for:  Everyone born from [birth year] through 1965.  Anyone with known risk factors for hepatitis C. Sexually transmitted infections (STIs)  Get screened for STIs, including gonorrhea and chlamydia, if: ? You are sexually active and are younger than 69 years of age. ? You are older than 69 years of age and your health care provider tells you that you are at risk for this type of infection. ? Your sexual activity has changed since you were last screened, and you are at increased risk for chlamydia  or gonorrhea. Ask your health care provider if you are at risk.  Ask your health care provider about whether you are at high risk for HIV. Your health care provider may recommend a prescription medicine to help prevent HIV infection. If you choose to take medicine to prevent HIV, you should first get tested for HIV. You should then be tested every 3 months for as long as you are taking the medicine. Pregnancy  If you are about to stop  having your period (premenopausal) and you may become pregnant, seek counseling before you get pregnant.  Take 400 to 800 micrograms (mcg) of folic acid every day if you become pregnant.  Ask for birth control (contraception) if you want to prevent pregnancy. Osteoporosis and menopause Osteoporosis is a disease in which the bones lose minerals and strength with aging. This can result in bone fractures. If you are 37 years old or older, or if you are at risk for osteoporosis and fractures, ask your health care provider if you should:  Be screened for bone loss.  Take a calcium or vitamin D supplement to lower your risk of fractures.  Be given hormone replacement therapy (HRT) to treat symptoms of menopause. Follow these instructions at home: Lifestyle  Do not use any products that contain nicotine or tobacco, such as cigarettes, e-cigarettes, and chewing tobacco. If you need help quitting, ask your health care provider.  Do not use street drugs.  Do not share needles.  Ask your health care provider for help if you need support or information about quitting drugs. Alcohol use  Do not drink alcohol if: ? Your health care provider tells you not to drink. ? You are pregnant, may be pregnant, or are planning to become pregnant.  If you drink alcohol: ? Limit how much you use to 0-1 drink a day. ? Limit intake if you are breastfeeding.  Be aware of how much alcohol is in your drink. In the [place] one drink equals one 12 oz bottle of beer (355 mL), one 5 oz glass of wine (148 mL), or one 1 oz glass of hard liquor (44 mL). General instructions  Schedule regular health, dental, and eye exams.  Stay current with your vaccines.  Tell your health care provider if: ? You often feel depressed. ? You have ever been abused or do not feel safe at home. Summary  Adopting a healthy lifestyle and getting preventive care are important in promoting health and wellness.  Follow your health  care provider's instructions about healthy diet, exercising, and getting tested or screened for diseases.  Follow your health care provider's instructions on monitoring your cholesterol and blood pressure. This information is not intended to replace advice given to you by your health care provider. Make sure you discuss any questions you have with your health care provider. Document Revised: 02/20/2018 Document Reviewed: 02/20/2018 Elsevier Patient Education  2020 [employer].

## 2020-02-27 NOTE — Assessment & Plan Note (Signed)
Flu shot complete. Covid-19 up to date including booster. Pneumonia complete. Shingrix counseled. Tetanus due 2023. Colonoscopy due now getting Jan 2022. Mammogram due 2023, pap smear aged out and dexa declines further. Counseled about sun safety and mole surveillance. Counseled about the dangers of distracted driving. Given 10 year screening recommendations.

## 2020-02-27 NOTE — Assessment & Plan Note (Signed)
Following up with GI and colonoscopy in January 2022.

## 2020-02-27 NOTE — Assessment & Plan Note (Signed)
Recent labs normal and no adjustment needed to synthroid 88 mcg daily.

## 2020-02-27 NOTE — Progress Notes (Signed)
Subjective:   Patient ID: [name], female    DOB: 06/25/1950, 69 y.o.   MRN: [id number]  HPI Here for medicare wellness and physical, no new complaints. Please see A/P for status and treatment of chronic medical problems.   Diet: heart healthy Physical activity: sedentary Depression/mood screen: negative Hearing: intact to whispered voice Visual acuity: grossly normal, performs annual eye exam  ADLs: capable Fall risk: none Home safety: good Cognitive evaluation: intact to orientation, naming, recall and repetition EOL planning: adv directives discussed  [place] Visit from 02/27/2020 in [place] at [facility]  PHQ-2 Total Score [place] Office Visit from 02/27/2020 in [place] at [facility]  PHQ-9 Total Score 0      I have personally reviewed and have noted 1. The patient's medical and social history - reviewed today no changes 2. Their use of alcohol, tobacco or illicit drugs 3. Their current medications and supplements 4. The patient's functional ability including ADL's, fall risks, home safety risks and hearing or visual impairment. 5. Diet and physical activities 6. Evidence for depression or mood disorders 7. Care team reviewed and updated  Patient Care Team: Hoyt Koch, MD as PCP - General (Internal Medicine) Juanita Craver, MD as Consulting Physician (Gastroenterology) Katheran James., MD as Consulting Physician (Endocrinology) Past Medical History:  Diagnosis Date  . Arthritis   . Blood in stool   . Colon polyps   . Hyperlipidemia   . Hypertension   . Thyroid disease    Past Surgical History:  Procedure Laterality Date  . ABDOMINAL HYSTERECTOMY    . KNEE SURGERY Left 2018   Family History  Problem Relation Age of Onset  . Hypertension Mother   . Arthritis Sister   . Cancer Sister        breast and colon  . Diabetes Sister   . Diabetes Paternal Aunt   . Hypertension  Paternal Grandmother    Review of Systems  Constitutional: Negative.   HENT: Negative.   Eyes: Negative.   Respiratory: Negative for cough, chest tightness and shortness of breath.   Cardiovascular: Negative for chest pain, palpitations and leg swelling.  Gastrointestinal: Negative for abdominal distention, abdominal pain, constipation, diarrhea, nausea and vomiting.  Musculoskeletal: Negative.   Skin: Negative.   Neurological: Negative.   Psychiatric/Behavioral: Negative.     Objective:  Physical Exam Constitutional:      Appearance: She is well-developed and well-nourished.  HENT:     Head: Normocephalic and atraumatic.  Eyes:     Extraocular Movements: EOM normal.  Cardiovascular:     Rate and Rhythm: Normal rate and regular rhythm.  Pulmonary:     Effort: Pulmonary effort is normal. No respiratory distress.     Breath sounds: Normal breath sounds. No wheezing or rales.  Abdominal:     General: Bowel sounds are normal. There is no distension.     Palpations: Abdomen is soft.     Tenderness: There is no abdominal tenderness. There is no rebound.  Musculoskeletal:        General: No edema.     Cervical back: Normal range of motion.  Skin:    General: Skin is warm and dry.  Neurological:     Mental Status: She is alert and oriented to person, place, and time.     Coordination: Coordination normal.  Psychiatric:        Mood and Affect: Mood and affect normal.  Vitals:   02/27/20 0955  BP: 124/90  Pulse: 84  Temp: 98.3 F (36.8 C)  Weight: 170 lb 3.2 oz (77.2 kg)  Height: 5\' 7"  (1.702 m)   This visit occurred during the SARS-CoV-2 public health emergency.  Safety protocols were in place, including screening questions prior to the visit, additional usage of staff PPE, and extensive cleaning of exam room while observing appropriate contact time as indicated for disinfecting solutions.   Assessment & Plan:

## 2020-02-27 NOTE — Assessment & Plan Note (Signed)
Taking cymbalta and doing well overall coping fine with pandemic.

## 2020-02-27 NOTE — Assessment & Plan Note (Signed)
BP at goal on hctz 12.5 mg daily and losartan 50 mg daily. Checking CMP and adjust as needed.

## 2020-02-27 NOTE — Assessment & Plan Note (Signed)
Checking lipid panel and adjust simvastatin 20 mg daily as needed.  

## 2020-02-27 NOTE — Assessment & Plan Note (Signed)
Checking HgA1c and adjust as needed.  

## 2020-02-27 NOTE — Assessment & Plan Note (Signed)
Taking cymbalta and doing well overall.

## 2020-03-14 ENCOUNTER — Telehealth: Payer: Self-pay | Admitting: Internal Medicine

## 2020-03-29 NOTE — Telephone Encounter (Signed)
   Patient states pharmacy never received request for Simvastatin Please re-send

## 2020-03-30 ENCOUNTER — Other Ambulatory Visit: Payer: Self-pay | Admitting: Internal Medicine

## 2020-03-30 MED ORDER — SIMVASTATIN 20 MG PO TABS
ORAL_TABLET | ORAL | 1 refills | Status: DC
Start: 2020-03-30 — End: 2020-09-16

## 2020-03-30 NOTE — Telephone Encounter (Signed)
Rx re-sent. See meds.

## 2020-03-30 NOTE — Addendum Note (Signed)
Addended by: Cresenciano Lick on: 03/30/2020 12:59 PM   Modules accepted: Orders

## 2020-04-07 DIAGNOSIS — D124 Benign neoplasm of descending colon: Secondary | ICD-10-CM | POA: Diagnosis not present

## 2020-04-07 DIAGNOSIS — Z800 Family history of malignant neoplasm of digestive organs: Secondary | ICD-10-CM | POA: Diagnosis not present

## 2020-04-07 DIAGNOSIS — K5730 Diverticulosis of large intestine without perforation or abscess without bleeding: Secondary | ICD-10-CM | POA: Diagnosis not present

## 2020-04-07 DIAGNOSIS — K635 Polyp of colon: Secondary | ICD-10-CM | POA: Diagnosis not present

## 2020-04-07 DIAGNOSIS — Z1211 Encounter for screening for malignant neoplasm of colon: Secondary | ICD-10-CM | POA: Diagnosis not present

## 2020-05-13 ENCOUNTER — Other Ambulatory Visit: Payer: Self-pay | Admitting: Internal Medicine

## 2020-06-11 ENCOUNTER — Other Ambulatory Visit: Payer: Self-pay

## 2020-06-11 ENCOUNTER — Other Ambulatory Visit: Payer: Self-pay | Admitting: Internal Medicine

## 2020-06-11 ENCOUNTER — Ambulatory Visit (INDEPENDENT_AMBULATORY_CARE_PROVIDER_SITE_OTHER): Payer: Medicare PPO | Admitting: Internal Medicine

## 2020-06-11 ENCOUNTER — Encounter: Payer: Self-pay | Admitting: Internal Medicine

## 2020-06-11 VITALS — BP 130/80 | HR 75 | Temp 98.1°F | Ht 67.0 in | Wt 177.2 lb

## 2020-06-11 DIAGNOSIS — R0789 Other chest pain: Secondary | ICD-10-CM

## 2020-06-11 MED ORDER — PANTOPRAZOLE SODIUM 40 MG PO TBEC: 40.0000 mg | DELAYED_RELEASE_TABLET | Freq: Every day | ORAL | 0 refills | Status: DC

## 2020-06-11 NOTE — Progress Notes (Signed)
   Subjective:   Patient ID: [name], female    DOB: May 22, 1950, 70 y.o.   MRN: [id number]  HPI The patient is a 70 YO female coming in for chest pain and pressure going on for 2 weeks or so. Off and on. Randomly happening and more often right after eating. Not usually with exertion. Lasts for minutes to hours. Relieved by belching or stretching to move the gas. She was taking mucinex which did not seem to help. Some nose dripping since covid-19 vaccine.   Review of Systems  Constitutional: Negative.   HENT: Negative.   Eyes: Negative.   Respiratory: Negative for cough, chest tightness and shortness of breath.   Cardiovascular: Positive for chest pain. Negative for palpitations and leg swelling.  Gastrointestinal: Negative for abdominal distention, abdominal pain, constipation, diarrhea, nausea and vomiting.  Musculoskeletal: Negative.   Skin: Negative.   Neurological: Negative.   Psychiatric/Behavioral: Negative.     Objective:  Physical Exam Constitutional:      Appearance: She is well-developed.  HENT:     Head: Normocephalic and atraumatic.  Cardiovascular:     Rate and Rhythm: Normal rate and regular rhythm.  Pulmonary:     Effort: Pulmonary effort is normal. No respiratory distress.     Breath sounds: Normal breath sounds. No wheezing or rales.  Abdominal:     General: Bowel sounds are normal. There is no distension.     Palpations: Abdomen is soft.     Tenderness: There is abdominal tenderness. There is no rebound.     Comments: Upper abdomen tenderness  Musculoskeletal:     Cervical back: Normal range of motion.  Skin:    General: Skin is warm and dry.  Neurological:     Mental Status: She is alert and oriented to person, place, and time.     Coordination: Coordination normal.     Vitals:   06/11/20 1100  BP: 130/80  Pulse: 75  Temp: 98.1 F (36.7 C)  TempSrc: Oral  SpO2: 98%  Weight: 177 lb 3.2 oz (80.4 kg)  Height: 5\' 7"  (1.702 m)   EKG:  Rate 65, axis normal, interval normal, sinus, no st or t wave changes, no significant change compared to 2019  This visit occurred during the SARS-CoV-2 public health emergency.  Safety protocols were in place, including screening questions prior to the visit, additional usage of staff PPE, and extensive cleaning of exam room while observing appropriate contact time as indicated for disinfecting solutions.   Assessment & Plan:

## 2020-06-11 NOTE — Assessment & Plan Note (Signed)
EKG done without changes. Suspect GERD/gas pain. Rx protonix for 1-2 weeks to see if this helps. If no improvement could consider further testing.

## 2020-06-11 NOTE — Patient Instructions (Signed)
We have sent in protonix to take 1 pill daily for 2 weeks to calm down your stomach.

## 2020-06-18 ENCOUNTER — Other Ambulatory Visit: Payer: Self-pay | Admitting: Internal Medicine

## 2020-08-11 DIAGNOSIS — E039 Hypothyroidism, unspecified: Secondary | ICD-10-CM | POA: Diagnosis not present

## 2020-08-11 DIAGNOSIS — I10 Essential (primary) hypertension: Secondary | ICD-10-CM | POA: Diagnosis not present

## 2020-08-11 DIAGNOSIS — E559 Vitamin D deficiency, unspecified: Secondary | ICD-10-CM | POA: Diagnosis not present

## 2020-08-12 ENCOUNTER — Other Ambulatory Visit: Payer: Self-pay | Admitting: Internal Medicine

## 2020-09-16 ENCOUNTER — Other Ambulatory Visit: Payer: Self-pay | Admitting: Internal Medicine

## 2020-10-18 ENCOUNTER — Other Ambulatory Visit: Payer: Self-pay | Admitting: Internal Medicine

## 2020-10-18 DIAGNOSIS — Z1231 Encounter for screening mammogram for malignant neoplasm of breast: Secondary | ICD-10-CM

## 2020-11-17 ENCOUNTER — Other Ambulatory Visit: Payer: Self-pay | Admitting: Internal Medicine

## 2020-12-08 ENCOUNTER — Other Ambulatory Visit: Payer: Self-pay

## 2020-12-08 ENCOUNTER — Ambulatory Visit
Admission: RE | Admit: 2020-12-08 | Discharge: 2020-12-08 | Disposition: A | Discharge status: Home / Self Care | Payer: Medicare PPO | Source: Ambulatory Visit | Attending: Internal Medicine | Admitting: Internal Medicine

## 2020-12-08 DIAGNOSIS — Z1231 Encounter for screening mammogram for malignant neoplasm of breast: Secondary | ICD-10-CM

## 2020-12-15 ENCOUNTER — Other Ambulatory Visit: Payer: Self-pay | Admitting: Internal Medicine

## 2021-02-15 DIAGNOSIS — E559 Vitamin D deficiency, unspecified: Secondary | ICD-10-CM | POA: Diagnosis not present

## 2021-02-15 DIAGNOSIS — E039 Hypothyroidism, unspecified: Secondary | ICD-10-CM | POA: Diagnosis not present

## 2021-02-15 DIAGNOSIS — I10 Essential (primary) hypertension: Secondary | ICD-10-CM | POA: Diagnosis not present

## 2021-02-18 DIAGNOSIS — Z961 Presence of intraocular lens: Secondary | ICD-10-CM | POA: Diagnosis not present

## 2021-02-18 DIAGNOSIS — H04123 Dry eye syndrome of bilateral lacrimal glands: Secondary | ICD-10-CM | POA: Diagnosis not present

## 2021-02-18 DIAGNOSIS — H10413 Chronic giant papillary conjunctivitis, bilateral: Secondary | ICD-10-CM | POA: Diagnosis not present

## 2021-02-18 DIAGNOSIS — H5213 Myopia, bilateral: Secondary | ICD-10-CM | POA: Diagnosis not present

## 2021-02-28 ENCOUNTER — Other Ambulatory Visit: Payer: Self-pay

## 2021-02-28 ENCOUNTER — Encounter: Payer: Self-pay | Admitting: Internal Medicine

## 2021-02-28 ENCOUNTER — Ambulatory Visit (INDEPENDENT_AMBULATORY_CARE_PROVIDER_SITE_OTHER): Payer: Medicare PPO | Admitting: Internal Medicine

## 2021-02-28 VITALS — BP 124/70 | HR 75 | Resp 18 | Ht 67.0 in | Wt 182.8 lb

## 2021-02-28 DIAGNOSIS — G4700 Insomnia, unspecified: Secondary | ICD-10-CM | POA: Diagnosis not present

## 2021-02-28 DIAGNOSIS — I10 Essential (primary) hypertension: Secondary | ICD-10-CM | POA: Diagnosis not present

## 2021-02-28 DIAGNOSIS — F330 Major depressive disorder, recurrent, mild: Secondary | ICD-10-CM

## 2021-02-28 DIAGNOSIS — F411 Generalized anxiety disorder: Secondary | ICD-10-CM | POA: Diagnosis not present

## 2021-02-28 DIAGNOSIS — E039 Hypothyroidism, unspecified: Secondary | ICD-10-CM

## 2021-02-28 DIAGNOSIS — M159 Polyosteoarthritis, unspecified: Secondary | ICD-10-CM | POA: Diagnosis not present

## 2021-02-28 DIAGNOSIS — E782 Mixed hyperlipidemia: Secondary | ICD-10-CM | POA: Diagnosis not present

## 2021-02-28 DIAGNOSIS — Z0000 Encounter for general adult medical examination without abnormal findings: Secondary | ICD-10-CM | POA: Diagnosis not present

## 2021-02-28 LAB — COMPREHENSIVE METABOLIC PANEL
ALT: 11 U/L (ref 0–35)
AST: 21 U/L (ref 0–37)
Albumin: 3.9 g/dL (ref 3.5–5.2)
Alkaline Phosphatase: 74 U/L (ref 39–117)
BUN: 14 mg/dL (ref 6–23)
CO2: 27 mEq/L (ref 19–32)
Calcium: 9.5 mg/dL (ref 8.4–10.5)
Chloride: 103 mEq/L (ref 96–112)
Creatinine, Ser: 0.73 mg/dL (ref 0.40–1.20)
GFR: 83.03 mL/min (ref >60.00)
Glucose, Bld: 90 mg/dL (ref 70–99)
Potassium: 3.9 mEq/L (ref 3.5–5.1)
Sodium: 137 mEq/L (ref 135–145)
Total Bilirubin: 0.5 mg/dL (ref 0.2–1.2)
Total Protein: 7.6 g/dL (ref 6.0–8.3)

## 2021-02-28 LAB — CBC
HCT: 40.8 % (ref 36.0–46.0)
Hemoglobin: 13.3 g/dL (ref 12.0–15.0)
MCHC: 32.6 g/dL (ref 30.0–36.0)
MCV: 91.3 fl (ref 78.0–100.0)
Platelets: 201 10*3/uL (ref 150.0–400.0)
RBC: 4.47 Mil/uL (ref 3.87–5.11)
RDW: 14.7 % (ref 11.5–15.5)
WBC: 6.5 10*3/uL (ref 4.0–10.5)

## 2021-02-28 LAB — LIPID PANEL
Cholesterol: 160 mg/dL (ref 0–200)
HDL: 61.7 mg/dL (ref >39.00)
LDL Cholesterol: 88 mg/dL (ref 0–99)
NonHDL: 98.06
Total CHOL/HDL Ratio: 3
Triglycerides: 48 mg/dL (ref 0.0–149.0)
VLDL: 9.6 mg/dL (ref 0.0–40.0)

## 2021-02-28 MED ORDER — FLUTICASONE PROPIONATE 50 MCG/ACT NA SUSP: 2.0000 | Freq: Every day | NASAL | 3 refills | Status: DC

## 2021-02-28 MED ORDER — TRAZODONE HCL 50 MG PO TABS: 50.0000 mg | ORAL_TABLET | Freq: Every day | ORAL | 3 refills | Status: DC

## 2021-02-28 MED ORDER — NEOMYCIN-POLYMYXIN-HC 3.5-10000-1 OT SOLN: 3.0000 [drp] | Freq: Three times a day (TID) | OTIC | 0 refills | Status: DC

## 2021-02-28 MED ORDER — SIMVASTATIN 20 MG PO TABS
20.0000 mg | ORAL_TABLET | Freq: Every day | ORAL | 3 refills | Status: DC
Start: 2021-02-28 — End: 2022-02-06

## 2021-02-28 MED ORDER — DULOXETINE HCL 60 MG PO CPEP
60.0000 mg | ORAL_CAPSULE | Freq: Every day | ORAL | 3 refills | Status: DC
Start: 2021-02-28 — End: 2022-03-02

## 2021-02-28 MED ORDER — LOSARTAN POTASSIUM-HCTZ 50-12.5 MG PO TABS: 1.0000 | ORAL_TABLET | Freq: Every day | ORAL | 3 refills | Status: DC

## 2021-02-28 NOTE — Assessment & Plan Note (Signed)
Taking cymbalta and overall well controlled. Will continue.

## 2021-02-28 NOTE — Assessment & Plan Note (Signed)
Checking CMP and adjust losartan/hctz 50/12.5 mg daily as needed. Rx for combination pill from 2 separate pills as this is available again. Dosing kept the same. Adjust as needed.

## 2021-02-28 NOTE — Assessment & Plan Note (Signed)
Checking lipid panel and adjust simvastatin 20 mg daily as needed.  

## 2021-02-28 NOTE — Assessment & Plan Note (Signed)
Seeing endo for dosing. Stable on synthroid 88 mcg daily with recent labs reviewed.

## 2021-02-28 NOTE — Assessment & Plan Note (Signed)
Uses tylenol when needed for pain.

## 2021-02-28 NOTE — Assessment & Plan Note (Signed)
Taking cymbalta 60 mg daily and satisfied with control.

## 2021-02-28 NOTE — Assessment & Plan Note (Signed)
Flu shot up to date. Covid-19 up to date. Pneumonia complete. Shingrix complete. Tetanus due 2023. Colonoscopy due 2027. Mammogram due 2024, pap smear aged out and dexa no further indicated . Counseled about sun safety and mole surveillance. Counseled about the dangers of distracted driving. Given 10 year screening recommendations.

## 2021-02-28 NOTE — Progress Notes (Signed)
Subjective:   Patient ID: [name], female    DOB: 04-Sep-1950, 70 y.o.   MRN: [id number]  HPI Here for medicare wellness and physical, no new complaints. Please see A/P for status and treatment of chronic medical problems.   Diet: heart healthy Physical activity: sedentary Depression/mood screen: negative Hearing: intact to whispered voice Visual acuity: grossly normal, performs annual eye exam  ADLs: capable Fall risk: none Home safety: good Cognitive evaluation: intact to orientation, naming, recall and repetition EOL planning: adv directives discussed  [place] Visit from 02/28/2021 in [place] at [facility]  PHQ-2 Total Score [place] Office Visit from 02/28/2021 in [place] at [facility]  PHQ-9 Total Score 0      Fall Risk 01/30/2020 01/30/2020 01/31/2020 02/27/2020 06/11/2020  Falls in the past year? - - - 0 0  Was there an injury with Fall? - - - 0 -  Fall Risk Category Calculator - - - 0 -  Fall Risk Category - - - Low -  Patient Fall Risk Level Low fall risk Low fall risk Low fall risk - Low fall risk  Patient at Risk for Falls Due to - - - - No Fall Risks  Fall risk Follow up - - - - Falls evaluation completed    I have personally reviewed and have noted 1. The patient's medical and social history - reviewed today no changes 2. Their use of alcohol, tobacco or illicit drugs 3. Their current medications and supplements 4. The patient's functional ability including ADL's, fall risks, home safety risks and hearing or visual impairment. 5. Diet and physical activities 6. Evidence for depression or mood disorders 7. Care team reviewed and updated 8.  The patient is not on an opioid pain medication   Patient Care Team: Hoyt Koch, MD as PCP - General (Internal Medicine) Juanita Craver, MD as Consulting Physician (Gastroenterology) Katheran James., MD as Consulting Physician  (Endocrinology) Past Medical History:  Diagnosis Date   Arthritis    Blood in stool    Colon polyps    Hyperlipidemia    Hypertension    Thyroid disease    Past Surgical History:  Procedure Laterality Date   ABDOMINAL HYSTERECTOMY     KNEE SURGERY Left 2018   Family History  Problem Relation Age of Onset   Hypertension Mother    Arthritis Sister    Cancer Sister        breast and colon   Diabetes Sister    Diabetes Paternal Aunt    Hypertension Paternal Grandmother    Review of Systems  Constitutional: Negative.   HENT: Negative.    Eyes: Negative.   Respiratory:  Negative for cough, chest tightness and shortness of breath.   Cardiovascular:  Negative for chest pain, palpitations and leg swelling.  Gastrointestinal:  Negative for abdominal distention, abdominal pain, constipation, diarrhea, nausea and vomiting.  Musculoskeletal: Negative.   Skin: Negative.   Neurological: Negative.   Psychiatric/Behavioral: Negative.     Objective:  Physical Exam Constitutional:      Appearance: She is well-developed.  HENT:     Head: Normocephalic and atraumatic.  Cardiovascular:     Rate and Rhythm: Normal rate and regular rhythm.  Pulmonary:     Effort: Pulmonary effort is normal. No respiratory distress.     Breath sounds: Normal breath sounds. No wheezing or rales.  Abdominal:     General: Bowel sounds  are normal. There is no distension.     Palpations: Abdomen is soft.     Tenderness: There is no abdominal tenderness. There is no rebound.  Musculoskeletal:     Cervical back: Normal range of motion.  Skin:    General: Skin is warm and dry.  Neurological:     Mental Status: She is alert and oriented to person, place, and time.     Coordination: Coordination normal.    Vitals:   02/28/21 1006  BP: 124/70  Pulse: 75  Resp: 18  SpO2: 97%  Weight: 182 lb 12.8 oz (82.9 kg)  Height: 5\' 7"  (1.702 m)   This visit occurred during the SARS-CoV-2 public health emergency.   Safety protocols were in place, including screening questions prior to the visit, additional usage of staff PPE, and extensive cleaning of exam room while observing appropriate contact time as indicated for disinfecting solutions.   Assessment & Plan:

## 2021-02-28 NOTE — Assessment & Plan Note (Signed)
Doing well with trazodone 50 mg qhs. Refilled.

## 2021-03-15 ENCOUNTER — Other Ambulatory Visit: Payer: Self-pay | Admitting: Internal Medicine

## 2021-05-03 NOTE — Progress Notes (Signed)
Virtual Visit via audio only note  I connected with [name] on 05/03/21 at 10:20 AM EST by a video enabled telemedicine application, but she was able to see me and I was not able to see her so was an audio visit.  I verified that I am speaking with the correct person using two identifiers.   I discussed the limitations of evaluation and management by telemedicine and the availability of in person appointments. The patient expressed understanding and agreed to proceed.  Present for the visit:  Myself, Dr Billey Gosling, Ericka Pontiff.  The patient is currently at home and I am in the office.    No referring provider.    History of Present Illness: She is here for an acute visit for cold symptoms.   Her symptoms started 2/16 or 2/17.  She started having a headache on 2/16 and thinks that is when her symptoms may have started.  She did take COVID test and was +2/20.  She started to have symptoms on a cruise.  She is experiencing intermittent fevers, chills, nasal congestion, left ear feels clogged, primarily dry cough, myalgias and headaches.  She had nausea when it first started, but that has resolved.  All of her symptoms have improved.  Her cough is intermittent and not severe.  She has not tried taking anything.   Had all the vaccines.   Review of Systems  Constitutional:  Positive for chills and fever.  HENT:  Positive for congestion and ear pain (clogged). Negative for sinus pain and sore throat.        No change in taste/smell  Respiratory:  Positive for cough (mostly dry). Negative for shortness of breath and wheezing.   Cardiovascular:  Negative for chest pain.  Gastrointestinal:  Positive for nausea (when it first started). Negative for diarrhea and vomiting.  Musculoskeletal:  Positive for myalgias.  Neurological:  Positive for headaches. Negative for dizziness.     Social History   Socioeconomic History   Marital status: Married    Spouse name: Not on file   Number  of children: Not on file   Years of education: Not on file   Highest education level: Not on file  Occupational History   Not on file  Tobacco Use   Smoking status: Never   Smokeless tobacco: Never  Vaping Use   Vaping Use: Never used  Substance and Sexual Activity   Alcohol use: No    Alcohol/week: 0.0 standard drinks   Drug use: No   Sexual activity: Not on file  Other Topics Concern   Not on file  Social History Narrative   Not on file   Social Determinants of Health   Financial Resource Strain: Not on file  Food Insecurity: Not on file  Transportation Needs: Not on file  Physical Activity: Not on file  Stress: Not on file  Social Connections: Not on file     Observations/Objective:    Assessment and Plan:  COVID: Acute Symptoms started 5-6 days ago-most likely 6 days ago Symptoms mild in nature She has not taken anything over-the-counter for her cold symptoms Symptoms are improving Discussed quarantine recommendations.  Discussed that she can recheck her COVID test if she wishes, but it is not necessary Discussed antiviral treatment-she may be on the brink of if she can actually take it or not.  Discussed possible side effects.  At this point she does not feel that she needs to take an antiviral Discussed symptomatic treatment with  over-the-counter cold medications, cough suppressants Advised rest, fluids Advised her to call if she has any questions or concerns   Follow Up Instructions:    I discussed the assessment and treatment plan with the patient. The patient was provided an opportunity to ask questions and all were answered. The patient agreed with the plan and demonstrated an understanding of the instructions.   The patient was advised to call back or seek an in-person evaluation if the symptoms worsen or if the condition fails to improve as anticipated.  Time spent on audio visit: 8 minutes  Binnie Rail, MD

## 2021-05-04 ENCOUNTER — Encounter: Payer: Self-pay | Admitting: Internal Medicine

## 2021-05-04 ENCOUNTER — Telehealth (INDEPENDENT_AMBULATORY_CARE_PROVIDER_SITE_OTHER): Payer: Medicare PPO | Admitting: Internal Medicine

## 2021-05-04 DIAGNOSIS — U071 COVID-19: Secondary | ICD-10-CM

## 2021-05-10 DIAGNOSIS — Z20822 Contact with and (suspected) exposure to covid-19: Secondary | ICD-10-CM | POA: Diagnosis not present

## 2021-08-16 DIAGNOSIS — E039 Hypothyroidism, unspecified: Secondary | ICD-10-CM | POA: Diagnosis not present

## 2021-08-16 DIAGNOSIS — I10 Essential (primary) hypertension: Secondary | ICD-10-CM | POA: Diagnosis not present

## 2021-08-16 DIAGNOSIS — E559 Vitamin D deficiency, unspecified: Secondary | ICD-10-CM | POA: Diagnosis not present

## 2021-08-29 ENCOUNTER — Ambulatory Visit (INDEPENDENT_AMBULATORY_CARE_PROVIDER_SITE_OTHER): Payer: Medicare PPO | Admitting: Internal Medicine

## 2021-08-29 ENCOUNTER — Encounter: Payer: Self-pay | Admitting: Internal Medicine

## 2021-08-29 DIAGNOSIS — J069 Acute upper respiratory infection, unspecified: Secondary | ICD-10-CM | POA: Insufficient documentation

## 2021-08-29 MED ORDER — HYDROCORTISONE (PERIANAL) 2.5 % EX CREA
1.0000 | TOPICAL_CREAM | Freq: Two times a day (BID) | CUTANEOUS | 0 refills | Status: DC
Start: 2021-08-29 — End: 2022-11-01

## 2021-08-29 MED ORDER — FLUTICASONE PROPIONATE 50 MCG/ACT NA SUSP: 2.0000 | Freq: Every day | NASAL | 3 refills | Status: AC

## 2021-08-29 MED ORDER — PROMETHAZINE-DM 6.25-15 MG/5ML PO SYRP: 5.0000 mL | ORAL_SOLUTION | Freq: Four times a day (QID) | ORAL | 0 refills | Status: DC | PRN

## 2021-08-29 NOTE — Progress Notes (Signed)
   Subjective:   Patient ID: [name], female    DOB: 16-Jun-1950, 71 y.o.   MRN: [id number]  Sore Throat  Associated symptoms include congestion and coughing. Pertinent negatives include no ear discharge, ear pain, shortness of breath or trouble swallowing.   The patient is a 71 YO female coming in for cold symptoms 6 days.  Review of Systems  Constitutional:  Positive for activity change, appetite change and fatigue. Negative for chills, fever and unexpected weight change.  HENT:  Positive for congestion, postnasal drip, rhinorrhea, sinus pressure and voice change. Negative for ear discharge, ear pain, sinus pain, sneezing, sore throat, tinnitus and trouble swallowing.   Eyes: Negative.   Respiratory:  Positive for cough. Negative for chest tightness, shortness of breath and wheezing.   Cardiovascular: Negative.   Gastrointestinal: Negative.   Musculoskeletal:  Positive for myalgias.  Neurological: Negative.     Objective:  Physical Exam Constitutional:      Appearance: She is well-developed.  HENT:     Head: Normocephalic and atraumatic.     Comments: Oropharynx with redness and clear drainage, nose with swollen turbinates, TMs normal bilaterally.  Neck:     Thyroid: No thyromegaly.  Cardiovascular:     Rate and Rhythm: Normal rate and regular rhythm.  Pulmonary:     Effort: Pulmonary effort is normal. No respiratory distress.     Breath sounds: Normal breath sounds. No wheezing or rales.  Abdominal:     Palpations: Abdomen is soft.  Musculoskeletal:        General: Tenderness present.     Cervical back: Normal range of motion.  Lymphadenopathy:     Cervical: No cervical adenopathy.  Skin:    General: Skin is warm and dry.  Neurological:     Mental Status: She is alert and oriented to person, place, and time.     Vitals:   08/29/21 0819  BP: 130/82  Pulse: 75  Temp: 97.6 F (36.4 C)  TempSrc: Oral  SpO2: 97%  Weight: 181 lb 8 oz (82.3 kg)     Assessment & Plan:

## 2021-08-29 NOTE — Patient Instructions (Signed)
Your lungs sound clear.  We have sent in flonase to take 2 sprays in each nostril once a day for the next 1-2 weeks.  We have sent in promethazine/dm cough medicine to use for cough.

## 2021-08-29 NOTE — Assessment & Plan Note (Signed)
Suspect viral or allergic. Rx flonase as she has not been taking this or claritin. Advised to resume claritin otc as well as start flonase 2 sprays daily each nostril. Rx promethazine/dm cough syrup. No antibiotics are indicated today. Lungs are clear and no SOB.

## 2021-10-14 IMAGING — MG MM DIGITAL SCREENING BILAT W/ TOMO AND CAD
8 series · 8 of 24 positions shown · non-contrast
Comparison: Previous exam(s).

CLINICAL DATA: Screening.

EXAM:
DIGITAL SCREENING BILATERAL MAMMOGRAM WITH TOMOSYNTHESIS AND CAD
TECHNIQUE: Bilateral screening digital craniocaudal and mediolateral oblique
mammograms were obtained. Bilateral screening digital breast
tomosynthesis was performed. The images were evaluated with
computer-aided detection.

[R CC synth-2D]
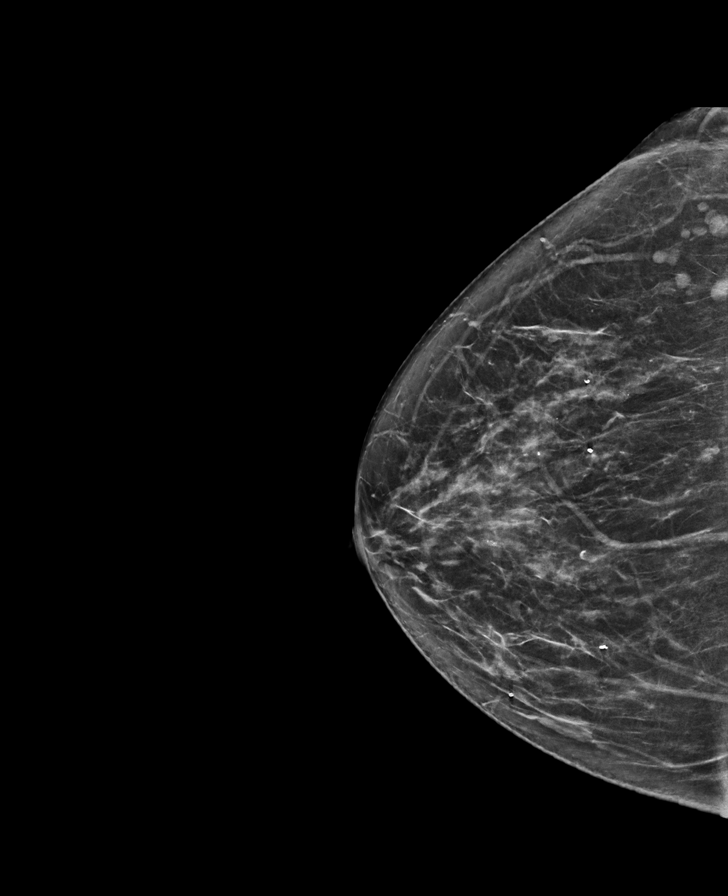

[R MLO synth-2D]
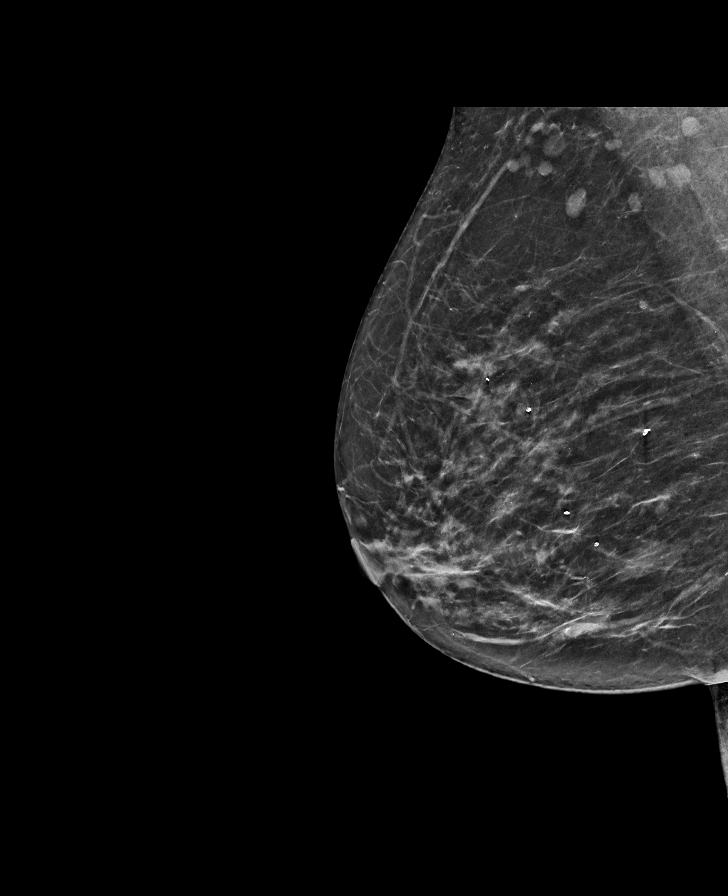

[L MLO synth-2D]
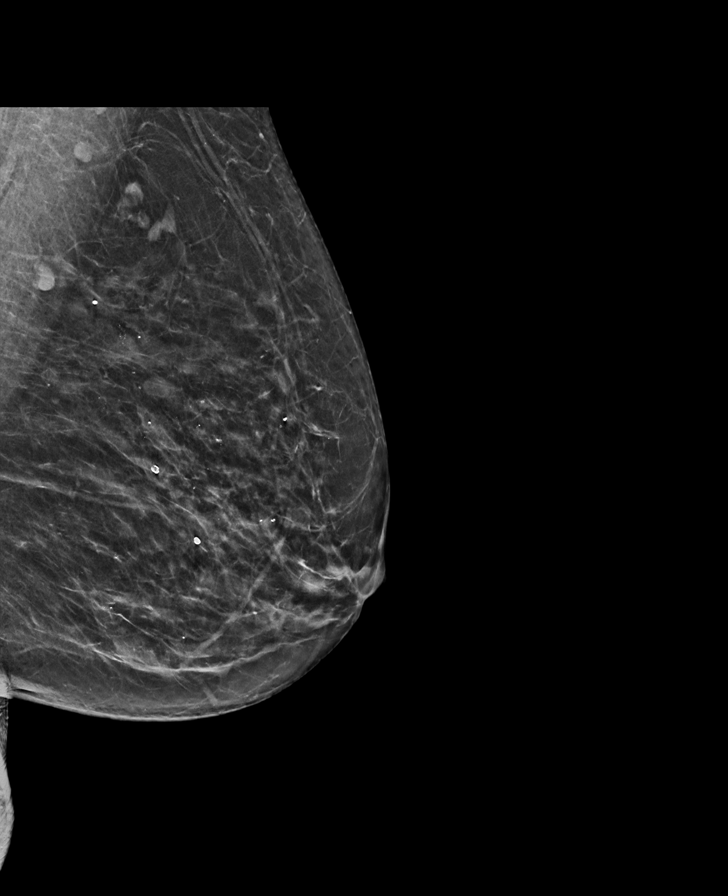

[L CC synth-2D]
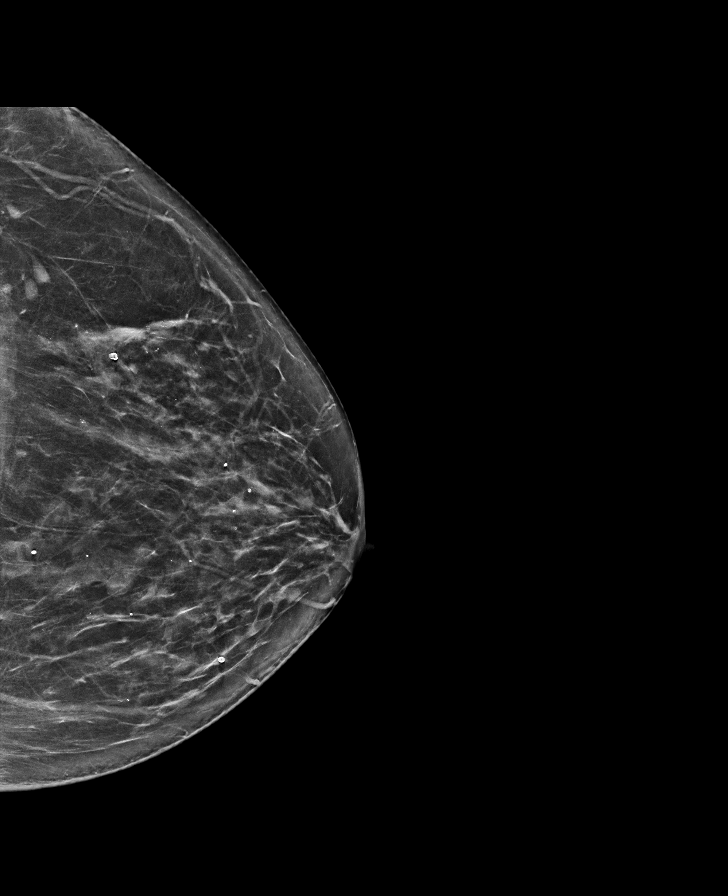

[L CC tomo · tomo slice 35/70.0]
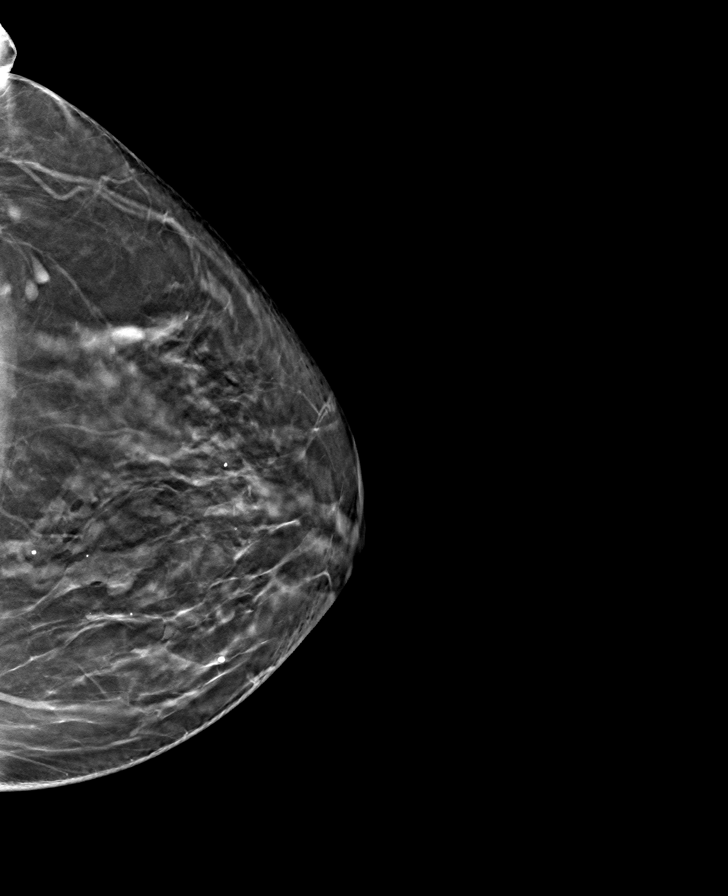

[R MLO tomo · tomo slice 34/67.0]
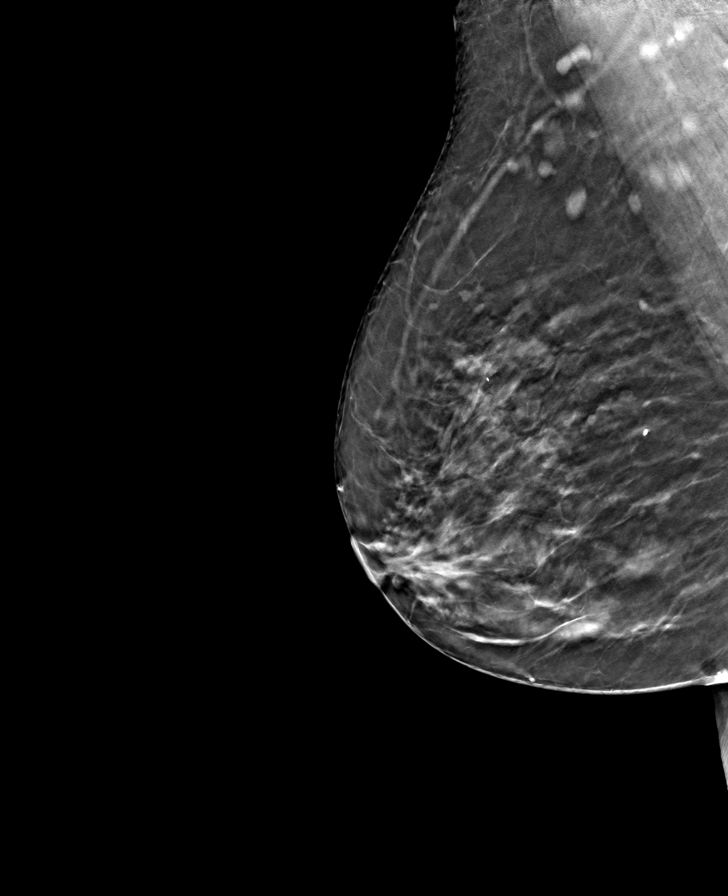

[R CC tomo · tomo slice 35/70.0]
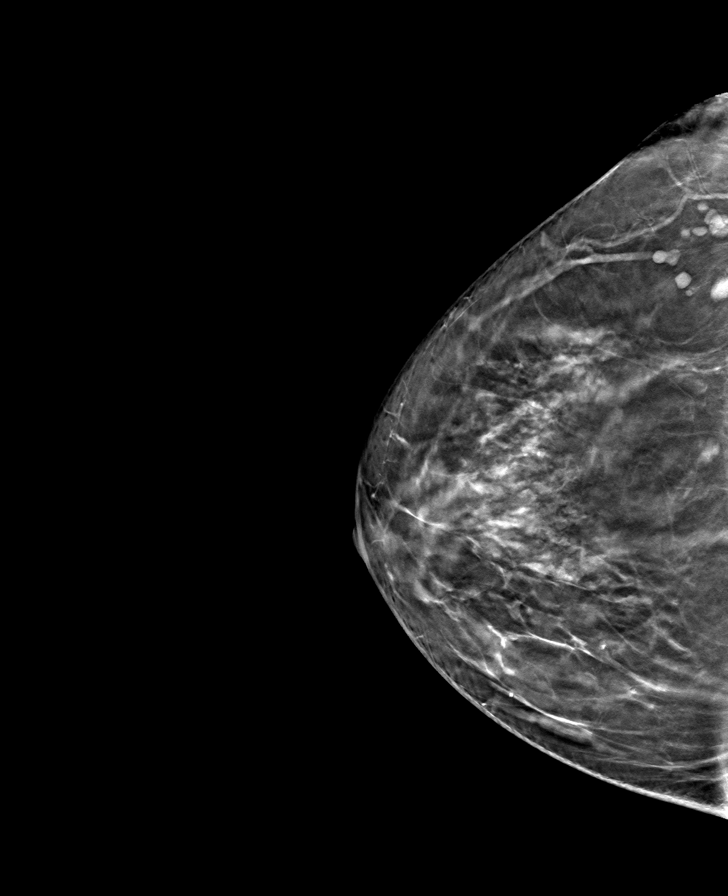

[L MLO tomo · tomo slice 34/67.0]
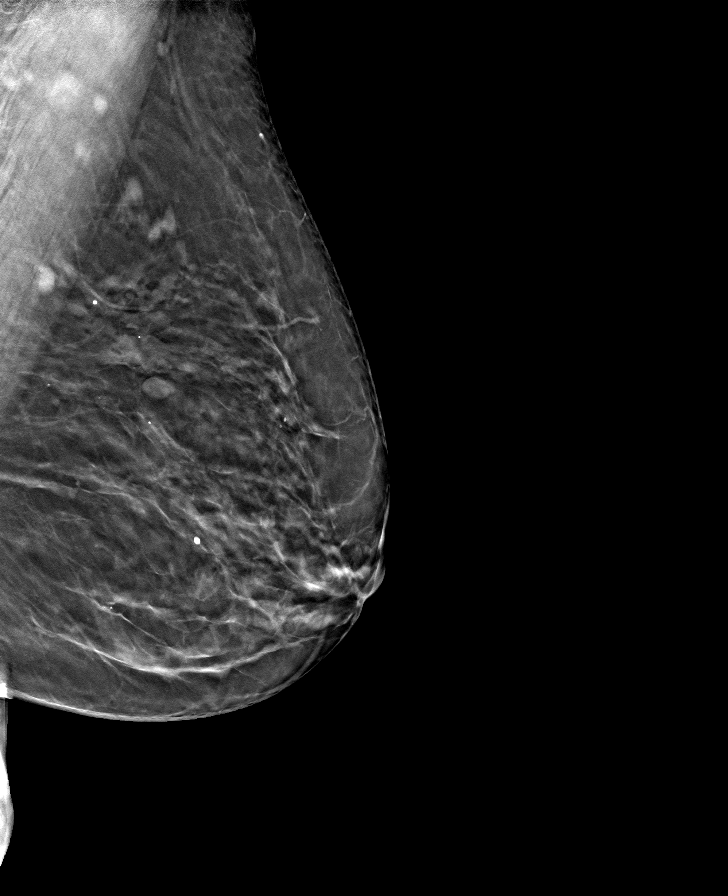

[8 of 24 positions shown; findings below may reference images not displayed]

ACR Breast Density Category c: The breast tissue is heterogeneously
dense, which may obscure small masses.
FINDINGS: There are no findings suspicious for malignancy.
IMPRESSION: No mammographic evidence of malignancy. A result letter of this
screening mammogram will be mailed directly to the patient.

RECOMMENDATION:
Screening mammogram in one year. (Code:Q3-W-BC3)

BI-RADS CATEGORY  1: Negative.

## 2021-11-07 ENCOUNTER — Other Ambulatory Visit: Payer: Self-pay | Admitting: Internal Medicine

## 2021-11-07 DIAGNOSIS — Z1231 Encounter for screening mammogram for malignant neoplasm of breast: Secondary | ICD-10-CM

## 2021-11-22 DIAGNOSIS — M1711 Unilateral primary osteoarthritis, right knee: Secondary | ICD-10-CM | POA: Diagnosis not present

## 2021-11-22 DIAGNOSIS — M170 Bilateral primary osteoarthritis of knee: Secondary | ICD-10-CM | POA: Diagnosis not present

## 2021-11-22 DIAGNOSIS — M5136 Other intervertebral disc degeneration, lumbar region: Secondary | ICD-10-CM | POA: Diagnosis not present

## 2021-11-22 DIAGNOSIS — M1712 Unilateral primary osteoarthritis, left knee: Secondary | ICD-10-CM | POA: Diagnosis not present

## 2021-11-22 DIAGNOSIS — M25561 Pain in right knee: Secondary | ICD-10-CM | POA: Diagnosis not present

## 2021-11-22 DIAGNOSIS — M545 Low back pain, unspecified: Secondary | ICD-10-CM | POA: Diagnosis not present

## 2021-11-25 ENCOUNTER — Encounter: Payer: Self-pay | Admitting: Internal Medicine

## 2021-11-25 ENCOUNTER — Ambulatory Visit (INDEPENDENT_AMBULATORY_CARE_PROVIDER_SITE_OTHER): Payer: Medicare PPO | Admitting: Internal Medicine

## 2021-11-25 VITALS — BP 124/80 | HR 70 | Ht 67.0 in | Wt 182.0 lb

## 2021-11-25 DIAGNOSIS — M545 Low back pain, unspecified: Secondary | ICD-10-CM

## 2021-11-25 DIAGNOSIS — Z23 Encounter for immunization: Secondary | ICD-10-CM | POA: Diagnosis not present

## 2021-11-25 LAB — URINALYSIS, ROUTINE W REFLEX MICROSCOPIC
Bilirubin Urine: NEGATIVE
Ketones, ur: NEGATIVE
Leukocytes,Ua: NEGATIVE
Nitrite: NEGATIVE
Specific Gravity, Urine: 1.02 (ref 1.000–1.030)
Total Protein, Urine: NEGATIVE
Urine Glucose: NEGATIVE
Urobilinogen, UA: 0.2 (ref 0.0–1.0)
pH: 5.5 (ref 5.0–8.0)

## 2021-11-25 LAB — COMPREHENSIVE METABOLIC PANEL
ALT: 11 U/L (ref 0–35)
AST: 15 U/L (ref 0–37)
Albumin: 3.7 g/dL (ref 3.5–5.2)
Alkaline Phosphatase: 69 U/L (ref 39–117)
BUN: 19 mg/dL (ref 6–23)
CO2: 29 mEq/L (ref 19–32)
Calcium: 9.1 mg/dL (ref 8.4–10.5)
Chloride: 106 mEq/L (ref 96–112)
Creatinine, Ser: 0.88 mg/dL (ref 0.40–1.20)
GFR: 66.01 mL/min (ref >60.00)
Glucose, Bld: 94 mg/dL (ref 70–99)
Potassium: 3.8 mEq/L (ref 3.5–5.1)
Sodium: 139 mEq/L (ref 135–145)
Total Bilirubin: 0.4 mg/dL (ref 0.2–1.2)
Total Protein: 7.3 g/dL (ref 6.0–8.3)

## 2021-11-25 NOTE — Progress Notes (Signed)
   Subjective:   Patient ID: [name], female    DOB: Jun 24, 1950, 71 y.o.   MRN: [id number]  HPI The patient is a 71 YO female coming in for follow up.  Review of Systems  Constitutional:  Negative for activity change, appetite change, chills, fatigue, fever and unexpected weight change.  Respiratory: Negative.    Cardiovascular: Negative.   Gastrointestinal: Negative.   Musculoskeletal:  Positive for arthralgias, back pain and myalgias. Negative for gait problem and joint swelling.  Skin: Negative.   Neurological: Negative.     Objective:  Physical Exam Constitutional:      Appearance: She is well-developed.  HENT:     Head: Normocephalic and atraumatic.  Cardiovascular:     Rate and Rhythm: Normal rate and regular rhythm.  Pulmonary:     Effort: Pulmonary effort is normal. No respiratory distress.     Breath sounds: Normal breath sounds. No wheezing or rales.  Abdominal:     General: Bowel sounds are normal. There is no distension.     Palpations: Abdomen is soft.     Tenderness: There is no abdominal tenderness. There is no rebound.  Musculoskeletal:        General: Tenderness present.     Cervical back: Normal range of motion.  Skin:    General: Skin is warm and dry.  Neurological:     Mental Status: She is alert and oriented to person, place, and time.     Coordination: Coordination normal.     Vitals:   11/25/21 1017  BP: 124/80  Pulse: 70  SpO2: 94%  Weight: 182 lb (82.6 kg)  Height: '5\' 7"'$  (1.702 m)    Assessment & Plan:  Flu shot given at visit

## 2021-11-25 NOTE — Patient Instructions (Signed)
We will check the labs and the urine today.

## 2021-11-25 NOTE — Assessment & Plan Note (Signed)
Checking BMP and U/A for signs of kidney changes, infection, stone. Suspect muscular based on exam and pain with twisting motion.

## 2021-12-05 ENCOUNTER — Other Ambulatory Visit: Payer: Self-pay | Admitting: Internal Medicine

## 2021-12-05 DIAGNOSIS — R1032 Left lower quadrant pain: Secondary | ICD-10-CM

## 2021-12-05 DIAGNOSIS — M25561 Pain in right knee: Secondary | ICD-10-CM | POA: Diagnosis not present

## 2021-12-05 DIAGNOSIS — M545 Low back pain, unspecified: Secondary | ICD-10-CM | POA: Diagnosis not present

## 2021-12-07 DIAGNOSIS — M25561 Pain in right knee: Secondary | ICD-10-CM | POA: Diagnosis not present

## 2021-12-07 DIAGNOSIS — M545 Low back pain, unspecified: Secondary | ICD-10-CM | POA: Diagnosis not present

## 2021-12-12 DIAGNOSIS — M25561 Pain in right knee: Secondary | ICD-10-CM | POA: Diagnosis not present

## 2021-12-13 ENCOUNTER — Ambulatory Visit: Payer: Medicare PPO

## 2021-12-14 ENCOUNTER — Ambulatory Visit
Admission: RE | Admit: 2021-12-14 | Discharge: 2021-12-14 | Disposition: A | Discharge status: Home / Self Care | Payer: Medicare PPO | Source: Ambulatory Visit | Attending: Internal Medicine | Admitting: Internal Medicine

## 2021-12-14 DIAGNOSIS — K769 Liver disease, unspecified: Secondary | ICD-10-CM | POA: Diagnosis not present

## 2021-12-14 DIAGNOSIS — M25561 Pain in right knee: Secondary | ICD-10-CM | POA: Diagnosis not present

## 2021-12-14 DIAGNOSIS — R1032 Left lower quadrant pain: Secondary | ICD-10-CM

## 2021-12-14 DIAGNOSIS — M545 Low back pain, unspecified: Secondary | ICD-10-CM | POA: Diagnosis not present

## 2021-12-14 DIAGNOSIS — D1803 Hemangioma of intra-abdominal structures: Secondary | ICD-10-CM | POA: Diagnosis not present

## 2021-12-14 DIAGNOSIS — K5790 Diverticulosis of intestine, part unspecified, without perforation or abscess without bleeding: Secondary | ICD-10-CM | POA: Diagnosis not present

## 2021-12-14 DIAGNOSIS — M47816 Spondylosis without myelopathy or radiculopathy, lumbar region: Secondary | ICD-10-CM | POA: Diagnosis not present

## 2021-12-19 ENCOUNTER — Telehealth: Payer: Self-pay | Admitting: Internal Medicine

## 2021-12-19 DIAGNOSIS — M545 Low back pain, unspecified: Secondary | ICD-10-CM | POA: Diagnosis not present

## 2021-12-19 DIAGNOSIS — M25561 Pain in right knee: Secondary | ICD-10-CM | POA: Diagnosis not present

## 2021-12-19 NOTE — Telephone Encounter (Signed)
Patient would like someone to call her with results of MRI

## 2021-12-20 NOTE — Telephone Encounter (Signed)
Notified pt with CT scan results not MRI. Pt voiced understanding.

## 2021-12-22 DIAGNOSIS — M25561 Pain in right knee: Secondary | ICD-10-CM | POA: Diagnosis not present

## 2021-12-22 DIAGNOSIS — M545 Low back pain, unspecified: Secondary | ICD-10-CM | POA: Diagnosis not present

## 2021-12-26 DIAGNOSIS — M25561 Pain in right knee: Secondary | ICD-10-CM | POA: Diagnosis not present

## 2021-12-28 DIAGNOSIS — M25561 Pain in right knee: Secondary | ICD-10-CM | POA: Diagnosis not present

## 2021-12-28 DIAGNOSIS — M545 Low back pain, unspecified: Secondary | ICD-10-CM | POA: Diagnosis not present

## 2022-01-03 DIAGNOSIS — M5136 Other intervertebral disc degeneration, lumbar region: Secondary | ICD-10-CM | POA: Diagnosis not present

## 2022-01-03 DIAGNOSIS — M1711 Unilateral primary osteoarthritis, right knee: Secondary | ICD-10-CM | POA: Diagnosis not present

## 2022-01-04 DIAGNOSIS — M545 Low back pain, unspecified: Secondary | ICD-10-CM | POA: Diagnosis not present

## 2022-01-04 DIAGNOSIS — M25561 Pain in right knee: Secondary | ICD-10-CM | POA: Diagnosis not present

## 2022-01-10 ENCOUNTER — Ambulatory Visit
Admission: RE | Admit: 2022-01-10 | Discharge: 2022-01-10 | Disposition: A | Discharge status: Home / Self Care | Payer: Medicare PPO | Source: Ambulatory Visit | Attending: Internal Medicine | Admitting: Internal Medicine

## 2022-01-10 DIAGNOSIS — M1711 Unilateral primary osteoarthritis, right knee: Secondary | ICD-10-CM | POA: Diagnosis not present

## 2022-01-10 DIAGNOSIS — Z1231 Encounter for screening mammogram for malignant neoplasm of breast: Secondary | ICD-10-CM

## 2022-01-11 DIAGNOSIS — M545 Low back pain, unspecified: Secondary | ICD-10-CM | POA: Diagnosis not present

## 2022-01-11 DIAGNOSIS — M25561 Pain in right knee: Secondary | ICD-10-CM | POA: Diagnosis not present

## 2022-01-17 DIAGNOSIS — M1711 Unilateral primary osteoarthritis, right knee: Secondary | ICD-10-CM | POA: Diagnosis not present

## 2022-01-24 DIAGNOSIS — M545 Low back pain, unspecified: Secondary | ICD-10-CM | POA: Diagnosis not present

## 2022-01-24 DIAGNOSIS — M25561 Pain in right knee: Secondary | ICD-10-CM | POA: Diagnosis not present

## 2022-02-03 ENCOUNTER — Other Ambulatory Visit: Payer: Self-pay | Admitting: Internal Medicine

## 2022-02-14 DIAGNOSIS — E039 Hypothyroidism, unspecified: Secondary | ICD-10-CM | POA: Diagnosis not present

## 2022-02-14 DIAGNOSIS — E559 Vitamin D deficiency, unspecified: Secondary | ICD-10-CM | POA: Diagnosis not present

## 2022-02-14 DIAGNOSIS — I10 Essential (primary) hypertension: Secondary | ICD-10-CM | POA: Diagnosis not present

## 2022-02-19 ENCOUNTER — Emergency Department (HOSPITAL_COMMUNITY): Payer: Medicare PPO

## 2022-02-19 ENCOUNTER — Emergency Department (HOSPITAL_COMMUNITY)
Admission: EM | Admit: 2022-02-19 | Discharge: 2022-02-19 | Disposition: A | Discharge status: Home / Self Care | Payer: Medicare PPO | Attending: Emergency Medicine | Admitting: Emergency Medicine

## 2022-02-19 DIAGNOSIS — M5441 Lumbago with sciatica, right side: Secondary | ICD-10-CM | POA: Diagnosis not present

## 2022-02-19 DIAGNOSIS — M47816 Spondylosis without myelopathy or radiculopathy, lumbar region: Secondary | ICD-10-CM | POA: Diagnosis not present

## 2022-02-19 DIAGNOSIS — M5451 Vertebrogenic low back pain: Secondary | ICD-10-CM | POA: Diagnosis not present

## 2022-02-19 DIAGNOSIS — M546 Pain in thoracic spine: Secondary | ICD-10-CM | POA: Diagnosis not present

## 2022-02-19 DIAGNOSIS — M25551 Pain in right hip: Secondary | ICD-10-CM | POA: Diagnosis not present

## 2022-02-19 MED ORDER — PREDNISONE 10 MG (21) PO TBPK: ORAL_TABLET | Freq: Every day | ORAL | 0 refills | Status: DC

## 2022-02-19 MED ORDER — OXYCODONE-ACETAMINOPHEN 5-325 MG PO TABS
1.0000 | ORAL_TABLET | Freq: Once | ORAL | Status: AC
  Administered 2022-02-19: 1 via ORAL
  Filled 2022-02-19: qty 1

## 2022-02-19 MED ORDER — DEXAMETHASONE SODIUM PHOSPHATE 10 MG/ML IJ SOLN
10.0000 mg | Freq: Once | INTRAMUSCULAR | Status: AC
  Administered 2022-02-19: 10 mg via INTRAMUSCULAR
  Filled 2022-02-19: qty 1

## 2022-02-19 MED ORDER — KETOROLAC TROMETHAMINE 60 MG/2ML IM SOLN
30.0000 mg | Freq: Once | INTRAMUSCULAR | Status: AC
  Administered 2022-02-19: 30 mg via INTRAMUSCULAR
  Filled 2022-02-19: qty 2

## 2022-02-19 MED ORDER — METHOCARBAMOL 500 MG PO TABS
750.0000 mg | ORAL_TABLET | Freq: Once | ORAL | Status: AC
  Administered 2022-02-19: 750 mg via ORAL
  Filled 2022-02-19: qty 2

## 2022-02-19 MED ORDER — LIDOCAINE 4 % EX PTCH: 1.0000 | MEDICATED_PATCH | CUTANEOUS | 0 refills | Status: DC

## 2022-02-19 MED ORDER — LIDOCAINE 5 % EX PTCH
1.0000 | MEDICATED_PATCH | CUTANEOUS | Status: DC
  Administered 2022-02-19: 1 via TRANSDERMAL
  Filled 2022-02-19: qty 1

## 2022-02-19 NOTE — ED Notes (Signed)
EDP into room, at BS.  ?

## 2022-02-19 NOTE — Discharge Instructions (Addendum)
Continue treating your pain with ibuprofen, Tylenol, and Robaxin.  A prescription was sent to your pharmacy for a steroid taper.  Take this as prescribed to limit inflammation and improve your pain.  A prescription for lidocaine patches was also sent to your pharmacy.  Avoid having more than 1 of these patches on your skin at any given time.  Follow-up with your orthopedic doctor for reassessment and ongoing management.  Return to the emergency department for any new or worsening symptoms of concern.

## 2022-02-19 NOTE — ED Provider Triage Note (Signed)
Emergency Medicine Provider Triage Evaluation Note  [name] , a 71 y.o. female  was evaluated in triage.  Pt complains of right-sided low back and leg pain.  Patient denies any trauma or known injury to the area.  Patient has been seeing orthopedics and has been doing exercises at home for the condition over the past month.  She states that her pain had subsided but last night came back and was intense on the right side.  Patient denies saddle anesthesia, urinary incontinence, urinary retention, fecal incontinence  Review of Systems  Positive: As above Negative: As above  Physical Exam  There were no vitals taken for this visit. Gen:   Awake, no distress   Resp:  Normal effort  MSK:   Moves extremities without difficulty  Other:    Medical Decision Making  Medically screening exam initiated at 5:43 PM.  Appropriate orders placed.  [name] was informed that the remainder of the evaluation will be completed by another provider, this initial triage assessment does not replace that evaluation, and the importance of remaining in the ED until their evaluation is complete.     Dorothyann Peng, PA-C 02/19/22 1744

## 2022-02-19 NOTE — ED Provider Notes (Signed)
[facility] EMERGENCY DEPARTMENT Provider Note   CSN: [id number] Arrival date & time: 02/19/22  1736     History  Chief Complaint  Patient presents with   Back Pain    [name] is a 71 y.o. female.   Back Pain Patient presents for back and hip pain.  She states that she has had muscle spasms in her upper legs in the past.  This occurred last month.  Last night, she had onset of pain that is primarily in the posterior aspect of her right pelvic area.  It does radiate to the lateral aspect of her upper right leg as well as to her right groin.  Pain is worsened with any movements.  She had muscle relaxers which she tried to take at home last night and this morning.  She did not experience any relief from her symptoms.  She states that she received a shot of medicine in the ambulance.  She was given a shot of Decadron in the ED waiting room.  She continues to have severe pain.  She denies any other associated symptoms, including any areas of numbness, weakness, or difficulty using the bathroom.     Home Medications Prior to Admission medications   Medication Sig Start Date End Date Taking? Authorizing Provider  lidocaine (HM LIDOCAINE PATCH) 4 % Place 1 patch onto the skin daily. 02/19/22  Yes Godfrey Pick, MD  predniSONE (STERAPRED UNI-PAK 21 TAB) 10 MG (21) TBPK tablet Take by mouth daily. Take 6 tabs by mouth daily  for 2 days, then 5 tabs for 2 days, then 4 tabs for 2 days, then 3 tabs for 2 days, 2 tabs for 2 days, then 1 tab by mouth daily for 2 days 02/19/22  Yes Godfrey Pick, MD  B Complex-C (SUPER B COMPLEX PO) Take 1 tablet by mouth daily.     [provider]  docusate sodium (COLACE) 100 MG capsule Take 100 mg by mouth 2 (two) times daily.     [provider]  DULoxetine (CYMBALTA) 60 MG capsule Take 1 capsule (60 mg total) by mouth daily. 02/28/21   Hoyt Koch, MD  fluticasone Asencion Islam) 50 MCG/ACT nasal spray Place 2 sprays  into both nostrils daily. 08/29/21   Hoyt Koch, MD  hydrocortisone (ANUSOL-HC) 2.5 % rectal cream Place 1 Application rectally 2 (two) times daily. 08/29/21   Hoyt Koch, MD  loratadine (CLARITIN) 10 MG tablet Take 1 tablet (10 mg total) by mouth daily. Patient taking differently: Take 10 mg by mouth daily as needed for allergies. 10/09/16   Hoyt Koch, MD  losartan-hydrochlorothiazide (HYZAAR) 50-12.5 MG tablet Take 1 tablet by mouth daily. 02/28/21   Hoyt Koch, MD  methocarbamol (ROBAXIN) 500 MG tablet Take 500 mg by mouth every 8 (eight) hours as needed. 11/22/21   [provider]  Multiple Vitamins-Minerals (MULTIVITAMIN WITH MINERALS) tablet Take 1 tablet by mouth daily.    [provider]  neomycin-polymyxin-hydrocortisone (CORTISPORIN) OTIC solution Place 3 drops into both ears 3 (three) times daily. 02/28/21   Hoyt Koch, MD  pantoprazole (PROTONIX) 40 MG tablet TAKE 1 TABLET(40 MG) BY MOUTH DAILY 06/11/20   Hoyt Koch, MD  promethazine-dextromethorphan (PROMETHAZINE-DM) 6.25-15 MG/5ML syrup Take 5 mLs by mouth 4 (four) times daily as needed for cough. 08/29/21   Hoyt Koch, MD  simvastatin (ZOCOR) 20 MG tablet TAKE 1 TABLET (20 MG TOTAL) BY MOUTH DAILY AT 6 PM. 02/06/22  Hoyt Koch, MD  SYNTHROID 88 MCG tablet Take 88 mcg by mouth daily. 01/14/20   [provider]  traZODone (DESYREL) 50 MG tablet TAKE 1 TABLET (50 MG TOTAL) BY MOUTH AT BEDTIME. 02/06/22   Hoyt Koch, MD      Allergies    Patient has no known allergies.    Review of Systems   Review of Systems  Musculoskeletal:  Positive for arthralgias, back pain and myalgias.  All other systems reviewed and are negative.   Physical Exam Updated Vital Signs BP (!) 124/110   Pulse 86   SpO2 98%  Physical Exam Vitals and nursing note reviewed.  Constitutional:      Appearance: Normal appearance. She is  well-developed. She is not ill-appearing, toxic-appearing or diaphoretic.  HENT:     Head: Normocephalic and atraumatic.     Right Ear: External ear normal.     Left Ear: External ear normal.     Nose: Nose normal.     Mouth/Throat:     Mouth: Mucous membranes are moist.     Pharynx: Oropharynx is clear.  Eyes:     Extraocular Movements: Extraocular movements intact.     Conjunctiva/sclera: Conjunctivae normal.  Cardiovascular:     Rate and Rhythm: Normal rate and regular rhythm.  Pulmonary:     Effort: Pulmonary effort is normal. No respiratory distress.  Abdominal:     General: There is no distension.     Palpations: Abdomen is soft.     Tenderness: There is no abdominal tenderness.  Musculoskeletal:        General: Tenderness present. No swelling or deformity. Normal range of motion.     Cervical back: Normal range of motion and neck supple.     Right lower leg: No edema.     Left lower leg: No edema.  Skin:    General: Skin is warm and dry.     Capillary Refill: Capillary refill takes less than 2 seconds.     Coloration: Skin is not jaundiced or pale.  Neurological:     General: No focal deficit present.     Mental Status: She is alert and oriented to person, place, and time.     Cranial Nerves: No cranial nerve deficit.     Sensory: No sensory deficit.     Motor: No weakness.     Coordination: Coordination normal.  Psychiatric:        Mood and Affect: Mood normal.        Behavior: Behavior normal.     ED Results / Procedures / Treatments   Labs (all labs ordered are listed, but only abnormal results are displayed) Labs Reviewed - No data to display  EKG None  Radiology DG Hip Unilat W or Wo Pelvis 2-3 Views Right  Result Date: 02/19/2022 CLINICAL DATA:  Right hip and back pain EXAM: DG HIP (WITH OR WITHOUT PELVIS) 2-3V RIGHT COMPARISON:  None Available. FINDINGS: Frontal view of the pelvis as well as frontal and frogleg lateral views of the right hip are  obtained. No acute fracture, subluxation, or dislocation within either hip. Joint spaces are well preserved. Sacroiliac joints are normal. There is mild lower lumbar spondylosis. Soft tissues are normal. IMPRESSION: 1. No acute displaced fracture. 2. Lower lumbar spondylosis. Electronically Signed   By: Randa Ngo M.D.   On: 02/19/2022 19:18    Procedures Procedures    Medications Ordered in ED Medications  lidocaine (LIDODERM) 5 % 1 patch (has  no administration in time range)  dexamethasone (DECADRON) injection 10 mg (10 mg Intramuscular Given 02/19/22 1748)  ketorolac (TORADOL) injection 30 mg (30 mg Intramuscular Given 02/19/22 1859)  oxyCODONE-acetaminophen (PERCOCET/ROXICET) 5-325 MG per tablet 1 tablet (1 tablet Oral Given 02/19/22 1859)  methocarbamol (ROBAXIN) tablet 750 mg (750 mg Oral Given 02/19/22 1859)    ED Course/ Medical Decision Making/ A&P                           Medical Decision Making Amount and/or Complexity of Data Reviewed Radiology: ordered.  Risk Prescription drug management.   Patient presents for right lower back pain radiating to lateral aspect of right upper leg and right medial thigh.  Vital signs on arrival in the ED are normal.  Patient was evaluated in ED and given shot of Decadron.  On assessment, patient is writhing in the bed.  She has no numbness or weakness on physical exam.  Her pain is worsened with straight leg raise.  Is also worsened with passive abduction and abduction of hip joint.  Due to ongoing severe pain, multimodal pain control was ordered.  X-ray imaging of right hip was ordered.  Results showed no acute findings.  On reassessment, patient had significant improvement in her symptoms following multimodal pain control.  Currently, she has Robaxin at home to take as needed.  She was advised to continue this in addition to Tylenol, ibuprofen, lidocaine patches, and a steroid taper.  Her prescriptions were sent to pharmacy.  She does have  an orthopedic doctor that she can follow-up with.  She was advised to follow-up with her orthopedic doctor for ongoing management of low back pain and sciatica.  She was discharged in good condition.        Final Clinical Impression(s) / ED Diagnoses Final diagnoses:  Acute right-sided low back pain with right-sided sciatica    Rx / DC Orders ED Discharge Orders          Ordered    predniSONE (STERAPRED UNI-PAK 21 TAB) 10 MG (21) TBPK tablet  Daily        02/19/22 2023    lidocaine (HM LIDOCAINE PATCH) 4 %  Every 24 hours        02/19/22 2023              Godfrey Pick, MD 02/19/22 2025

## 2022-02-19 NOTE — ED Triage Notes (Signed)
Patient BIB [facility] from home for evaluation of right back pain that started last night spontaneously, denies any recent injury. Patient received 119mg fentanyl from EMS PTA. Patient is alert, oriented ,and in no apparent distress at this time.

## 2022-02-27 ENCOUNTER — Other Ambulatory Visit: Payer: Self-pay | Admitting: Internal Medicine

## 2022-03-01 ENCOUNTER — Other Ambulatory Visit: Payer: Self-pay | Admitting: Internal Medicine

## 2022-03-02 ENCOUNTER — Ambulatory Visit (INDEPENDENT_AMBULATORY_CARE_PROVIDER_SITE_OTHER): Payer: Medicare PPO | Admitting: Internal Medicine

## 2022-03-02 ENCOUNTER — Ambulatory Visit (INDEPENDENT_AMBULATORY_CARE_PROVIDER_SITE_OTHER): Payer: Medicare PPO

## 2022-03-02 ENCOUNTER — Encounter: Payer: Self-pay | Admitting: Internal Medicine

## 2022-03-02 VITALS — BP 140/80 | HR 59 | Temp 98.4°F | Ht 67.0 in | Wt 188.0 lb

## 2022-03-02 DIAGNOSIS — E039 Hypothyroidism, unspecified: Secondary | ICD-10-CM | POA: Diagnosis not present

## 2022-03-02 DIAGNOSIS — F330 Major depressive disorder, recurrent, mild: Secondary | ICD-10-CM | POA: Diagnosis not present

## 2022-03-02 DIAGNOSIS — Z0000 Encounter for general adult medical examination without abnormal findings: Secondary | ICD-10-CM

## 2022-03-02 DIAGNOSIS — G4700 Insomnia, unspecified: Secondary | ICD-10-CM | POA: Diagnosis not present

## 2022-03-02 DIAGNOSIS — R7301 Impaired fasting glucose: Secondary | ICD-10-CM | POA: Diagnosis not present

## 2022-03-02 DIAGNOSIS — E782 Mixed hyperlipidemia: Secondary | ICD-10-CM

## 2022-03-02 DIAGNOSIS — I10 Essential (primary) hypertension: Secondary | ICD-10-CM | POA: Diagnosis not present

## 2022-03-02 DIAGNOSIS — F411 Generalized anxiety disorder: Secondary | ICD-10-CM

## 2022-03-02 LAB — COMPREHENSIVE METABOLIC PANEL
ALT: 14 U/L (ref 0–35)
AST: 14 U/L (ref 0–37)
Albumin: 3.4 g/dL — ABNORMAL LOW (ref 3.5–5.2)
Alkaline Phosphatase: 71 U/L (ref 39–117)
BUN: 28 mg/dL — ABNORMAL HIGH (ref 6–23)
CO2: 24 mEq/L (ref 19–32)
Calcium: 8.9 mg/dL (ref 8.4–10.5)
Chloride: 106 mEq/L (ref 96–112)
Creatinine, Ser: 0.77 mg/dL (ref 0.40–1.20)
GFR: 77.34 mL/min (ref >60.00)
Glucose, Bld: 102 mg/dL — ABNORMAL HIGH (ref 70–99)
Potassium: 4.1 mEq/L (ref 3.5–5.1)
Sodium: 140 mEq/L (ref 135–145)
Total Bilirubin: 0.3 mg/dL (ref 0.2–1.2)
Total Protein: 6.5 g/dL (ref 6.0–8.3)

## 2022-03-02 LAB — LIPID PANEL
Cholesterol: 185 mg/dL (ref 0–200)
HDL: 59.6 mg/dL (ref >39.00)
LDL Cholesterol: 116 mg/dL — ABNORMAL HIGH (ref 0–99)
NonHDL: 125.58
Total CHOL/HDL Ratio: 3
Triglycerides: 48 mg/dL (ref 0.0–149.0)
VLDL: 9.6 mg/dL (ref 0.0–40.0)

## 2022-03-02 LAB — HEMOGLOBIN A1C: Hgb A1c MFr Bld: 6.1 % (ref 4.6–6.5)

## 2022-03-02 MED ORDER — SIMVASTATIN 20 MG PO TABS: 20.0000 mg | ORAL_TABLET | Freq: Every day | ORAL | 3 refills | Status: DC

## 2022-03-02 MED ORDER — DULOXETINE HCL 60 MG PO CPEP: 60.0000 mg | ORAL_CAPSULE | Freq: Every day | ORAL | 3 refills | Status: DC

## 2022-03-02 NOTE — Assessment & Plan Note (Signed)
Well controlled with cymbalta 60 mg daily and will continue. Refilled today.

## 2022-03-02 NOTE — Patient Instructions (Signed)
[name] , Thank you for taking time to come for your Medicare Wellness Visit. I appreciate your ongoing commitment to your health goals. Please review the following plan we discussed and let me know if I can assist you in the future.   These are the goals we discussed:  Goals      My goal for 2024 is to stay healthy, work out more and take one day at a time.        This is a list of the screening recommended for you and due dates:  Health Maintenance  Topic Date Due   COVID-19 Vaccine (5 - 2023-24 season) 03/18/2022*   Medicare Annual Wellness Visit  03/03/2023   Mammogram  01/11/2024   Colon Cancer Screening  04/07/2025   Pneumonia Vaccine  Completed   Flu Shot  Completed   DEXA scan (bone density measurement)  Completed   Hepatitis C Screening: USPSTF Recommendation to screen - Ages 24-79 yo.  Completed   Zoster (Shingles) Vaccine  Completed   HPV Vaccine  Aged Out   DTaP/Tdap/Td vaccine  Discontinued  *Topic was postponed. The date shown is not the original due date.    Advanced directives: No  Conditions/risks identified: Yes  Next appointment: Follow up in one year for your annual wellness visit.   Preventive Care 5 Years and Older, Female Preventive care refers to lifestyle choices and visits with your health care provider that can promote health and wellness. What does preventive care include? A yearly physical exam. This is also called an annual well check. Dental exams once or twice a year. Routine eye exams. Ask your health care provider how often you should have your eyes checked. Personal lifestyle choices, including: Daily care of your teeth and gums. Regular physical activity. Eating a healthy diet. Avoiding tobacco and drug use. Limiting alcohol use. Practicing safe sex. Taking low-dose aspirin every day. Taking vitamin and mineral supplements as recommended by your health care provider. What happens during an annual well check? The services and  screenings done by your health care provider during your annual well check will depend on your age, overall health, lifestyle risk factors, and family history of disease. Counseling  Your health care provider may ask you questions about your: Alcohol use. Tobacco use. Drug use. Emotional well-being. Home and relationship well-being. Sexual activity. Eating habits. History of falls. Memory and ability to understand (cognition). Work and work Statistician. Reproductive health. Screening  You may have the following tests or measurements: Height, weight, and BMI. Blood pressure. Lipid and cholesterol levels. These may be checked every 5 years, or more frequently if you are over 89 years old. Skin check. Lung cancer screening. You may have this screening every year starting at age 69 if you have a 30-pack-year history of smoking and currently smoke or have quit within the past 15 years. Fecal occult blood test (FOBT) of the stool. You may have this test every year starting at age 19. Flexible sigmoidoscopy or colonoscopy. You may have a sigmoidoscopy every 5 years or a colonoscopy every 10 years starting at age 46. Hepatitis C blood test. Hepatitis B blood test. Sexually transmitted disease (STD) testing. Diabetes screening. This is done by checking your blood sugar (glucose) after you have not eaten for a while (fasting). You may have this done every 1-3 years. Bone density scan. This is done to screen for osteoporosis. You may have this done starting at age 35. Mammogram. This may be done every 1-2 years.  Talk to your health care provider about how often you should have regular mammograms. Talk with your health care provider about your test results, treatment options, and if necessary, the need for more tests. Vaccines  Your health care provider may recommend certain vaccines, such as: Influenza vaccine. This is recommended every year. Tetanus, diphtheria, and acellular pertussis (Tdap,  Td) vaccine. You may need a Td booster every 10 years. Zoster vaccine. You may need this after age 3. Pneumococcal 13-valent conjugate (PCV13) vaccine. One dose is recommended after age 14. Pneumococcal polysaccharide (PPSV23) vaccine. One dose is recommended after age 72. Talk to your health care provider about which screenings and vaccines you need and how often you need them. This information is not intended to replace advice given to you by your health care provider. Make sure you discuss any questions you have with your health care provider. Document Released: 03/26/2015 Document Revised: 11/17/2015 Document Reviewed: 12/29/2014 Elsevier Interactive Patient Education  2017 [employer] Prevention in the Home Falls can cause injuries. They can happen to people of all ages. There are many things you can do to make your home safe and to help prevent falls. What can I do on the outside of my home? Regularly fix the edges of walkways and driveways and fix any cracks. Remove anything that might make you trip as you walk through a door, such as a raised step or threshold. Trim any bushes or trees on the path to your home. Use bright outdoor lighting. Clear any walking paths of anything that might make someone trip, such as rocks or tools. Regularly check to see if handrails are loose or broken. Make sure that both sides of any steps have handrails. Any raised decks and porches should have guardrails on the edges. Have any leaves, snow, or ice cleared regularly. Use sand or salt on walking paths during winter. Clean up any spills in your garage right away. This includes oil or grease spills. What can I do in the bathroom? Use night lights. Install grab bars by the toilet and in the tub and shower. Do not use towel bars as grab bars. Use non-skid mats or decals in the tub or shower. If you need to sit down in the shower, use a plastic, non-slip stool. Keep the floor dry. Clean up any  water that spills on the floor as soon as it happens. Remove soap buildup in the tub or shower regularly. Attach bath mats securely with double-sided non-slip rug tape. Do not have throw rugs and other things on the floor that can make you trip. What can I do in the bedroom? Use night lights. Make sure that you have a light by your bed that is easy to reach. Do not use any sheets or blankets that are too big for your bed. They should not hang down onto the floor. Have a firm chair that has side arms. You can use this for support while you get dressed. Do not have throw rugs and other things on the floor that can make you trip. What can I do in the kitchen? Clean up any spills right away. Avoid walking on wet floors. Keep items that you use a lot in easy-to-reach places. If you need to reach something above you, use a strong step stool that has a grab bar. Keep electrical cords out of the way. Do not use floor polish or wax that makes floors slippery. If you must use wax, use non-skid floor wax.  Do not have throw rugs and other things on the floor that can make you trip. What can I do with my stairs? Do not leave any items on the stairs. Make sure that there are handrails on both sides of the stairs and use them. Fix handrails that are broken or loose. Make sure that handrails are as long as the stairways. Check any carpeting to make sure that it is firmly attached to the stairs. Fix any carpet that is loose or worn. Avoid having throw rugs at the top or bottom of the stairs. If you do have throw rugs, attach them to the floor with carpet tape. Make sure that you have a light switch at the top of the stairs and the bottom of the stairs. If you do not have them, ask someone to add them for you. What else can I do to help prevent falls? Wear shoes that: Do not have high heels. Have rubber bottoms. Are comfortable and fit you well. Are closed at the toe. Do not wear sandals. If you use a  stepladder: Make sure that it is fully opened. Do not climb a closed stepladder. Make sure that both sides of the stepladder are locked into place. Ask someone to hold it for you, if possible. Clearly mark and make sure that you can see: Any grab bars or handrails. First and last steps. Where the edge of each step is. Use tools that help you move around (mobility aids) if they are needed. These include: Canes. Walkers. Scooters. Crutches. Turn on the lights when you go into a dark area. Replace any light bulbs as soon as they burn out. Set up your furniture so you have a clear path. Avoid moving your furniture around. If any of your floors are uneven, fix them. If there are any pets around you, be aware of where they are. Review your medicines with your doctor. Some medicines can make you feel dizzy. This can increase your chance of falling. Ask your doctor what other things that you can do to help prevent falls. This information is not intended to replace advice given to you by your health care provider. Make sure you discuss any questions you have with your health care provider. Document Released: 12/24/2008 Document Revised: 08/05/2015 Document Reviewed: 04/03/2014 Elsevier Interactive Patient Education  2017 [employer].

## 2022-03-02 NOTE — Assessment & Plan Note (Signed)
Uses trazodone 50 mg daily qhs and refill when due. Continue as working well.

## 2022-03-02 NOTE — Assessment & Plan Note (Signed)
Flu shot up to date. Covid-19 counseled. RSV counseled. Pneumonia complete. Shingrix complete. Tetanus due at pharmacy. Colonoscopy due 2027. Mammogram due 2025, pap smear aged out and dexa complete. Counseled about sun safety and mole surveillance. Counseled about the dangers of distracted driving. Given 10 year screening recommendations.

## 2022-03-02 NOTE — Assessment & Plan Note (Signed)
BP at goal on losartan/hctz 50/12.5 mg daily and did not take meds this morning. Checking CMP and CBC and lipid panel and adjust as needed.

## 2022-03-02 NOTE — Assessment & Plan Note (Signed)
Checking lipid panel and adjust simvastatin 20 mg daily as needed.  

## 2022-03-02 NOTE — Assessment & Plan Note (Signed)
Recent labs stable and dosing with endocrinology and taking synthroid 88 mcg daily.

## 2022-03-02 NOTE — Assessment & Plan Note (Signed)
Checking HgA1c and adjust as needed.  

## 2022-03-02 NOTE — Patient Instructions (Signed)
Think about getting the RSV vaccine in July 2024

## 2022-03-02 NOTE — Progress Notes (Signed)
   Subjective:   Patient ID: [name], female    DOB: 05-11-1950, 71 y.o.   MRN: [id number]  Oral Pain    The patient is here for physical.  PMH, [facility], social history reviewed and updated  Review of Systems  Constitutional: Negative.   HENT: Negative.    Eyes: Negative.   Respiratory:  Negative for cough, chest tightness and shortness of breath.   Cardiovascular:  Negative for chest pain, palpitations and leg swelling.  Gastrointestinal:  Negative for abdominal distention, abdominal pain, constipation, diarrhea, nausea and vomiting.  Musculoskeletal:  Positive for arthralgias and back pain.  Skin: Negative.   Neurological: Negative.   Psychiatric/Behavioral: Negative.      Objective:  Physical Exam Constitutional:      Appearance: She is well-developed.  HENT:     Head: Normocephalic and atraumatic.  Cardiovascular:     Rate and Rhythm: Normal rate and regular rhythm.  Pulmonary:     Effort: Pulmonary effort is normal. No respiratory distress.     Breath sounds: Normal breath sounds. No wheezing or rales.  Abdominal:     General: Bowel sounds are normal. There is no distension.     Palpations: Abdomen is soft.     Tenderness: There is no abdominal tenderness. There is no rebound.  Musculoskeletal:     Cervical back: Normal range of motion.  Skin:    General: Skin is warm and dry.  Neurological:     Mental Status: She is alert and oriented to person, place, and time.     Coordination: Coordination normal.     Vitals:   03/02/22 0815 03/02/22 0820  BP: (!) 140/80 (!) 140/80  Pulse: (!) 59   Temp: 98.4 F (36.9 C)   TempSrc: Oral   SpO2: 98%   Weight: 188 lb (85.3 kg)   Height: '5\' 7"'$  (1.702 m)     Assessment & Plan:

## 2022-03-02 NOTE — Progress Notes (Signed)
Virtual Visit via Telephone Note  I connected with  [name] on 03/02/22 at 10:00 AM EST by telephone and verified that I am speaking with the correct person using two identifiers.  Location: Patient: Home Provider: [place] Persons participating in the virtual visit: [place]   I discussed the limitations, risks, security and privacy concerns of performing an evaluation and management service by telephone and the availability of in person appointments. The patient expressed understanding and agreed to proceed.  Interactive audio and video telecommunications were attempted between this nurse and patient, however failed, due to patient having technical difficulties OR patient did not have access to video capability.  We continued and completed visit with audio only.  Some vital signs may be absent or patient reported.   Sheral Flow, LPN  Subjective:   [name] is a 71 y.o. female who presents for Medicare Annual (Subsequent) preventive examination.  Review of Systems     Cardiac Risk Factors include: advanced age (>35mn, >>32women);dyslipidemia;family history of premature cardiovascular disease;hypertension;sedentary lifestyle     Objective:    Today's Vitals   03/02/22 1003  BP: (!) 140/80  Pulse: (!) 59  Temp: 98.4 F (36.9 C)  SpO2: 98%  Weight: 188 lb (85.3 kg)  Height: '5\' 7"'$  (1.702 m)  PainSc: 0-No pain   Body mass index is 29.44 kg/m.     03/02/2022   10:04 AM 01/29/2020    6:23 PM 01/29/2020    4:52 PM  Advanced Directives  Does Patient Have a Medical Advance Directive? No No No  Would patient like information on creating a medical advance directive? No - Patient declined No - Patient declined     Current Medications (verified) Outpatient Encounter Medications as of 03/02/2022  Medication Sig   B Complex-C (SUPER B COMPLEX PO) Take 1 tablet by mouth daily.    docusate sodium (COLACE) 100 MG capsule  Take 100 mg by mouth 2 (two) times daily.    DULoxetine (CYMBALTA) 60 MG capsule Take 1 capsule (60 mg total) by mouth daily.   fluticasone (FLONASE) 50 MCG/ACT nasal spray Place 2 sprays into both nostrils daily.   hydrocortisone (ANUSOL-HC) 2.5 % rectal cream Place 1 Application rectally 2 (two) times daily.   lidocaine (HM LIDOCAINE PATCH) 4 % Place 1 patch onto the skin daily. (Patient not taking: Reported on 03/02/2022)   loratadine (CLARITIN) 10 MG tablet Take 1 tablet (10 mg total) by mouth daily. (Patient taking differently: Take 10 mg by mouth daily as needed for allergies.)   losartan-hydrochlorothiazide (HYZAAR) 50-12.5 MG tablet TAKE 1 TABLET EVERY DAY   methocarbamol (ROBAXIN) 500 MG tablet Take 500 mg by mouth every 8 (eight) hours as needed.   Multiple Vitamins-Minerals (MULTIVITAMIN WITH MINERALS) tablet Take 1 tablet by mouth daily.   neomycin-polymyxin-hydrocortisone (CORTISPORIN) OTIC solution Place 3 drops into both ears 3 (three) times daily.   pantoprazole (PROTONIX) 40 MG tablet TAKE 1 TABLET(40 MG) BY MOUTH DAILY   predniSONE (STERAPRED UNI-PAK 21 TAB) 10 MG (21) TBPK tablet Take by mouth daily. Take 6 tabs by mouth daily  for 2 days, then 5 tabs for 2 days, then 4 tabs for 2 days, then 3 tabs for 2 days, 2 tabs for 2 days, then 1 tab by mouth daily for 2 days   simvastatin (ZOCOR) 20 MG tablet Take 1 tablet (20 mg total) by mouth daily at 6 PM.   SYNTHROID 88 MCG tablet Take 88 mcg by  mouth daily.   traZODone (DESYREL) 50 MG tablet TAKE 1 TABLET (50 MG TOTAL) BY MOUTH AT BEDTIME.   No facility-administered encounter medications on file as of 03/02/2022.    Allergies (verified) Patient has no known allergies.   History: Past Medical History:  Diagnosis Date   Arthritis    Blood in stool    Colon polyps    Hyperlipidemia    Hypertension    Thyroid disease    Past Surgical History:  Procedure Laterality Date   ABDOMINAL HYSTERECTOMY     KNEE SURGERY Left 2018    Family History  Problem Relation Age of Onset   Hypertension Mother    Arthritis Sister    Cancer Sister        breast and colon   Diabetes Sister    Diabetes Paternal Aunt    Hypertension Paternal Grandmother    Social History   Socioeconomic History   Marital status: Married    Spouse name: Not on file   Number of children: Not on file   Years of education: Not on file   Highest education level: Not on file  Occupational History   Not on file  Tobacco Use   Smoking status: Never   Smokeless tobacco: Never  Vaping Use   Vaping Use: Never used  Substance and Sexual Activity   Alcohol use: No    Alcohol/week: 0.0 standard drinks of alcohol   Drug use: No   Sexual activity: Not on file  Other Topics Concern   Not on file  Social History Narrative   Not on file   Social Determinants of Health   Financial Resource Strain: Low Risk  (03/02/2022)   Overall Financial Resource Strain (CARDIA)    Difficulty of Paying Living Expenses: Not hard at all  Food Insecurity: No Food Insecurity (03/02/2022)   Hunger Vital Sign    Worried About Running Out of Food in the Last Year: Never true    [place] in the Last Year: Never true  Transportation Needs: No Transportation Needs (03/02/2022)   PRAPARE - Hydrologist (Medical): No    Lack of Transportation (Non-Medical): No  Physical Activity: Inactive (03/02/2022)   Exercise Vital Sign    Days of Exercise per Week: 0 days    Minutes of Exercise per Session: 0 min  Stress: No Stress Concern Present (03/02/2022)   [place]    Feeling of Stress : Not at all  Social Connections: [place] (03/02/2022)   Social Connection and Isolation Panel [NHANES]    Frequency of Communication with Friends and Family: More than three times a week    Frequency of Social Gatherings with Friends and Family: More than three times a  week    Attends Religious Services: More than 4 times per year    Active Member of [employer] or Organizations: Yes    Attends Music therapist: More than 4 times per year    Marital Status: Married    Tobacco Counseling Counseling given: Not Answered   Clinical Intake:  Pre-visit preparation completed: Yes  Pain : No/denies pain Pain Score: 0-No pain Pain Type: Acute pain Pain Location: Back Pain Orientation: Right, Lower Pain Radiating Towards: R hip, R thigh, R groin Pain Descriptors / Indicators: Shooting, Nagging, Radiating     BMI - recorded: 29.44 Nutritional Status: BMI 25 -29 Overweight Nutritional Risks: None Diabetes: No  How often  do you need to have someone help you when you read instructions, pamphlets, or other written materials from your doctor or pharmacy?: 1 - Never What is the last grade level you completed in school?: Bachelor's of Science Degree  Diabetic? no  Interpreter Needed?: No  Information entered by :: Lisette Abu, LPN.   Activities of Daily Living    03/02/2022   10:16 AM  In your present state of health, do you have any difficulty performing the following activities:  Hearing? 0  Vision? 0  Difficulty concentrating or making decisions? 0  Walking or climbing stairs? 0  Dressing or bathing? 0  Doing errands, shopping? 0  Preparing Food and eating ? N  Using the Toilet? N  In the past six months, have you accidently leaked urine? N  Do you have problems with loss of bowel control? N  Managing your Medications? N  Managing your Finances? N  Housekeeping or managing your Housekeeping? N    Patient Care Team: Hoyt Koch, MD as PCP - General (Internal Medicine) Juanita Craver, MD as Consulting Physician (Gastroenterology) Katheran James., MD as Consulting Physician (Endocrinology) Melissa Noon, Scott as Referring Physician (Optometry)  Indicate any recent Medical Services you may have received from  other than [facility] providers in the past year (date may be approximate).     Assessment:   This is a routine wellness examination for [name].  Hearing/Vision screen Hearing Screening - Comments:: Denies hearing difficulties   Vision Screening - Comments:: Patient up to date with routine eye exams with Melissa Noon, OD.   Dietary issues and exercise activities discussed: Current Exercise Habits: The patient does not participate in regular exercise at present, Exercise limited by: orthopedic condition(s)   Goals Addressed             This Visit's Progress    My goal for 2024 is to stay healthy, work out more and take one day at a time.        Depression Screen    03/02/2022   10:16 AM 03/02/2022    8:15 AM 08/29/2021    8:23 AM 02/28/2021   10:07 AM 02/27/2020   10:02 AM 12/24/2018   10:50 AM 12/13/2016   10:16 AM  PHQ 2/9 Scores  PHQ - 2 Score 0 0 0 0 0 0 0  PHQ- 9 Score 0 0 1 0 0 1     Fall Risk    03/02/2022   10:16 AM 03/02/2022    8:21 AM 03/02/2022    8:14 AM 08/29/2021    8:23 AM 06/11/2020   11:08 AM  Fall Risk   Falls in the past year? 0 0 0 0 0  Number falls in past yr: 0 0 0  0  Injury with Fall? 0 0 0    Risk for fall due to : No Fall Risks    No Fall Risks  Follow up Falls prevention discussed Falls evaluation completed Falls evaluation completed  Falls evaluation completed    [place]:  Any stairs in or around the home? Yes  If so, are there any without handrails? No  Home free of loose throw rugs in walkways, pet beds, electrical cords, etc? Yes  Adequate lighting in your home to reduce risk of falls? Yes   ASSISTIVE DEVICES UTILIZED TO PREVENT FALLS:  Life alert? No  Use of a cane, walker or w/c? No  Grab bars in the bathroom? Yes  Shower chair or bench in shower? Yes  Elevated toilet seat or a handicapped toilet? Yes   TIMED UP AND ZS:WFUXN Visit  Was the test performed? No .   Cognitive Function:         03/02/2022   10:17 AM  6CIT Screen  What Year? 0 points  What month? 0 points  What time? 0 points  Count back from 20 0 points  Months in reverse 0 points  Repeat phrase 0 points  Total Score 0 points    Immunizations Immunization History  Administered Date(s) Administered   Fluad Quad(high Dose 65+) 11/26/2018, 12/14/2020, 11/25/2021   Influenza, High Dose Seasonal PF 12/15/2015, 12/13/2016, 11/27/2017, 12/12/2019   Influenza-Unspecified 11/27/2017, 12/06/2018   PFIZER(Purple Top)SARS-COV-2 Vaccination 04/18/2019, 05/09/2019, 12/30/2019   Pfizer Covid-19 Vaccine Bivalent Booster 76yr & up 01/03/2021   Pneumococcal Conjugate-13 12/13/2016   Pneumococcal Polysaccharide-23 12/17/2017   Tdap 06/12/2007, 08/28/2011   Zoster Recombinat (Shingrix) 12/30/2018, 02/28/2019   Zoster, Live 08/23/2010, 08/24/2010    TDAP status: Due, Education has been provided regarding the importance of this vaccine. Advised may receive this vaccine at local pharmacy or Health Dept. Aware to provide a copy of the vaccination record if obtained from local pharmacy or Health Dept. Verbalized acceptance and understanding.  Flu Vaccine status: Up to date  Pneumococcal vaccine status: Up to date  Covid-19 vaccine status: Completed vaccines  Qualifies for Shingles Vaccine? Yes   Zostavax completed Yes   Shingrix Completed?: Yes  Screening Tests Health Maintenance  Topic Date Due   COVID-19 Vaccine (5 - 2023-24 season) 03/18/2022 (Originally 11/11/2021)   [place] ([place]  03/03/2023   MAMMOGRAM  01/11/2024   COLONOSCOPY (Pts 45-473yrInsurance coverage will need to be confirmed)  04/07/2025   Pneumonia Vaccine 6568Years old  Completed   INFLUENZA VACCINE  Completed   DEXA SCAN  Completed   Hepatitis C Screening  Completed   Zoster Vaccines- Shingrix  Completed   HPV VACCINES  Aged Out   DTaP/Tdap/Td  Discontinued    Health Maintenance  There are no preventive care  reminders to display for this patient.   Colorectal cancer screening: Type of screening: Colonoscopy. Completed 03/18/2020. Repeat every 5 years  Mammogram status: Completed 01/10/2022. Repeat every year  Bone density status: completed or no longer recommended.  Lung Cancer Screening: (Low Dose CT Chest recommended if Age 71-80ears, 30 pack-year currently smoking OR have quit w/in 15years.) does not qualify.   Lung Cancer Screening Referral: no  Additional Screening:  Hepatitis C Screening: does qualify; Completed 05/20/2015  Vision Screening: Recommended annual ophthalmology exams for early detection of glaucoma and other disorders of the eye. Is the patient up to date with their annual eye exam?  Yes  Who is the provider or what is the name of the office in which the patient attends annual eye exams? [facility]. If pt is not established with a provider, would they like to be referred to a provider to establish care? No .   Dental Screening: Recommended annual dental exams for proper oral hygiene  Community Resource Referral / Chronic Care Management: CRR required this visit?  No   CCM required this visit?  No      Plan:     I have personally reviewed and noted the following in the patient's chart:   Medical and social history Use of alcohol, tobacco or illicit drugs  Current medications and supplements including opioid prescriptions. Patient is not currently taking  opioid prescriptions. Functional ability and status Nutritional status Physical activity Advanced directives List of other physicians Hospitalizations, surgeries, and ER visits in previous 12 months Vitals Screenings to include cognitive, depression, and falls Referrals and appointments  In addition, I have reviewed and discussed with patient certain preventive protocols, quality metrics, and best practice recommendations. A written personalized care plan for preventive services as well as general  preventive health recommendations were provided to patient.     Sheral Flow, LPN   [id number]   Nurse Notes: N/A

## 2022-03-02 NOTE — Assessment & Plan Note (Signed)
Takes cymbalta 60 mg daily and well controlled. Refilled and will continue.

## 2022-03-09 ENCOUNTER — Other Ambulatory Visit: Payer: Self-pay | Admitting: Internal Medicine

## 2022-03-09 ENCOUNTER — Telehealth: Payer: Self-pay | Admitting: Internal Medicine

## 2022-03-09 NOTE — Telephone Encounter (Signed)
Caller & Relationship to patient: patient   Call [phone] [phone]   Date of last office visit:03/02/2022   Date of next office visit:   Medication(s) to be refilled: duloxatine       Preferred Pharmacy:  [facility]

## 2022-03-10 NOTE — Telephone Encounter (Signed)
Should call pharmacy this was just refilled for 1 year supply on 03/02/22. If changing pharmacies ok to resend to new pharmacy

## 2022-03-10 NOTE — Telephone Encounter (Signed)
Left voicemail for patient to give [place] a call back if needed further help with her medication

## 2022-03-14 ENCOUNTER — Other Ambulatory Visit: Payer: Self-pay | Admitting: Internal Medicine

## 2022-03-14 DIAGNOSIS — M25561 Pain in right knee: Secondary | ICD-10-CM | POA: Diagnosis not present

## 2022-03-14 DIAGNOSIS — M545 Low back pain, unspecified: Secondary | ICD-10-CM | POA: Diagnosis not present

## 2022-03-14 DIAGNOSIS — M5136 Other intervertebral disc degeneration, lumbar region: Secondary | ICD-10-CM | POA: Diagnosis not present

## 2022-03-14 NOTE — Telephone Encounter (Signed)
Patient called back to follow up on this because it was sent to [facility] by mistake an needs to be sent to  [place], [place]

## 2022-03-30 DIAGNOSIS — M25561 Pain in right knee: Secondary | ICD-10-CM | POA: Diagnosis not present

## 2022-03-30 DIAGNOSIS — M5451 Vertebrogenic low back pain: Secondary | ICD-10-CM | POA: Diagnosis not present

## 2022-04-06 DIAGNOSIS — H524 Presbyopia: Secondary | ICD-10-CM | POA: Diagnosis not present

## 2022-04-06 DIAGNOSIS — M48061 Spinal stenosis, lumbar region without neurogenic claudication: Secondary | ICD-10-CM | POA: Diagnosis not present

## 2022-04-06 DIAGNOSIS — M5136 Other intervertebral disc degeneration, lumbar region: Secondary | ICD-10-CM | POA: Diagnosis not present

## 2022-04-06 DIAGNOSIS — M170 Bilateral primary osteoarthritis of knee: Secondary | ICD-10-CM | POA: Diagnosis not present

## 2022-04-06 DIAGNOSIS — H5213 Myopia, bilateral: Secondary | ICD-10-CM | POA: Diagnosis not present

## 2022-04-06 DIAGNOSIS — H43813 Vitreous degeneration, bilateral: Secondary | ICD-10-CM | POA: Diagnosis not present

## 2022-04-10 DIAGNOSIS — M5416 Radiculopathy, lumbar region: Secondary | ICD-10-CM | POA: Insufficient documentation

## 2022-04-14 DIAGNOSIS — M545 Low back pain, unspecified: Secondary | ICD-10-CM | POA: Diagnosis not present

## 2022-04-14 DIAGNOSIS — M5416 Radiculopathy, lumbar region: Secondary | ICD-10-CM | POA: Diagnosis not present

## 2022-04-18 DIAGNOSIS — M5416 Radiculopathy, lumbar region: Secondary | ICD-10-CM | POA: Diagnosis not present

## 2022-04-21 DIAGNOSIS — M5416 Radiculopathy, lumbar region: Secondary | ICD-10-CM | POA: Diagnosis not present

## 2022-04-21 DIAGNOSIS — M545 Low back pain, unspecified: Secondary | ICD-10-CM | POA: Diagnosis not present

## 2022-04-26 DIAGNOSIS — M25561 Pain in right knee: Secondary | ICD-10-CM | POA: Diagnosis not present

## 2022-04-26 DIAGNOSIS — M545 Low back pain, unspecified: Secondary | ICD-10-CM | POA: Diagnosis not present

## 2022-04-28 DIAGNOSIS — M545 Low back pain, unspecified: Secondary | ICD-10-CM | POA: Diagnosis not present

## 2022-05-01 DIAGNOSIS — M5416 Radiculopathy, lumbar region: Secondary | ICD-10-CM | POA: Diagnosis not present

## 2022-05-03 DIAGNOSIS — M545 Low back pain, unspecified: Secondary | ICD-10-CM | POA: Diagnosis not present

## 2022-05-03 DIAGNOSIS — M5416 Radiculopathy, lumbar region: Secondary | ICD-10-CM | POA: Diagnosis not present

## 2022-05-09 DIAGNOSIS — M48061 Spinal stenosis, lumbar region without neurogenic claudication: Secondary | ICD-10-CM | POA: Diagnosis not present

## 2022-05-09 DIAGNOSIS — M170 Bilateral primary osteoarthritis of knee: Secondary | ICD-10-CM | POA: Diagnosis not present

## 2022-05-18 DIAGNOSIS — M5416 Radiculopathy, lumbar region: Secondary | ICD-10-CM | POA: Diagnosis not present

## 2022-06-15 DIAGNOSIS — M5416 Radiculopathy, lumbar region: Secondary | ICD-10-CM | POA: Diagnosis not present

## 2022-07-06 DIAGNOSIS — M48062 Spinal stenosis, lumbar region with neurogenic claudication: Secondary | ICD-10-CM | POA: Diagnosis not present

## 2022-07-06 DIAGNOSIS — M5416 Radiculopathy, lumbar region: Secondary | ICD-10-CM | POA: Diagnosis not present

## 2022-07-25 DIAGNOSIS — M1711 Unilateral primary osteoarthritis, right knee: Secondary | ICD-10-CM | POA: Insufficient documentation

## 2022-07-25 DIAGNOSIS — M13861 Other specified arthritis, right knee: Secondary | ICD-10-CM | POA: Diagnosis not present

## 2022-08-09 ENCOUNTER — Telehealth: Payer: Self-pay | Admitting: Internal Medicine

## 2022-08-09 NOTE — Telephone Encounter (Signed)
We have received Surgical Clearance forms, to be filled out by provider. Patient requested to send it via Fax within 7-days. Document is located in providers tray at front office.Please advise at Mobile [phone] (mobile)   Please fax to: [phone]

## 2022-08-09 NOTE — Telephone Encounter (Signed)
Placed inside Dr Crawfords office box 

## 2022-08-10 NOTE — Telephone Encounter (Signed)
Needs visit with EKG schedule anytime

## 2022-08-10 NOTE — Telephone Encounter (Signed)
Called pt made appt for 08/15/22 @ :40../lmb

## 2022-08-15 ENCOUNTER — Ambulatory Visit (INDEPENDENT_AMBULATORY_CARE_PROVIDER_SITE_OTHER): Payer: Medicare PPO | Admitting: Internal Medicine

## 2022-08-15 ENCOUNTER — Encounter: Payer: Self-pay | Admitting: Internal Medicine

## 2022-08-15 VITALS — BP 124/78 | HR 74 | Temp 98.6°F | Ht 67.0 in | Wt 183.0 lb

## 2022-08-15 DIAGNOSIS — Z01818 Encounter for other preprocedural examination: Secondary | ICD-10-CM | POA: Insufficient documentation

## 2022-08-15 NOTE — Assessment & Plan Note (Signed)
EKG done and stable from prior. BP at goal. No chest pains. Overall low risk to proceed without further testing. Forms filled out. Counseled about risk of constipation post op due to opioids. Encouraged strongly to do PT as advised post-op to get best possible outcome.

## 2022-08-15 NOTE — Progress Notes (Signed)
   Subjective:   Patient ID: [name], female    DOB: 08/22/1950, 72 y.o.   MRN: [id number]  HPI The patient is a 72 YO female coming in for pre-op clearance. Right knee replacement. No chest pains or SOB.  Review of Systems  Constitutional: Negative.   HENT: Negative.    Eyes: Negative.   Respiratory:  Negative for cough, chest tightness and shortness of breath.   Cardiovascular:  Negative for chest pain, palpitations and leg swelling.  Gastrointestinal:  Negative for abdominal distention, abdominal pain, constipation, diarrhea, nausea and vomiting.  Musculoskeletal:  Positive for arthralgias and myalgias.  Skin: Negative.   Neurological: Negative.   Psychiatric/Behavioral: Negative.      Objective:  Physical Exam Constitutional:      Appearance: She is well-developed.  HENT:     Head: Normocephalic and atraumatic.  Cardiovascular:     Rate and Rhythm: Normal rate and regular rhythm.  Pulmonary:     Effort: Pulmonary effort is normal. No respiratory distress.     Breath sounds: Normal breath sounds. No wheezing or rales.  Abdominal:     General: Bowel sounds are normal. There is no distension.     Palpations: Abdomen is soft.     Tenderness: There is no abdominal tenderness. There is no rebound.  Musculoskeletal:        General: Tenderness present.     Cervical back: Normal range of motion.  Skin:    General: Skin is warm and dry.  Neurological:     Mental Status: She is alert and oriented to person, place, and time.     Coordination: Coordination normal.     Vitals:   08/15/22 1335  BP: 124/78  Pulse: 74  Temp: 98.6 F (37 C)  TempSrc: Oral  SpO2: 96%  Weight: 183 lb (83 kg)  Height: 5\' 7"  (1.702 m)   EKG: Rate 65, axis normal, interval normal, sinus, no st or t wave changes, no significant change compared to prior 2022   Assessment & Plan:  Visit time 20 minutes in face to face communication with patient and coordination of care, additional 10  minutes spent in record review, coordination or care, ordering tests, communicating/referring to other healthcare professionals, documenting in medical records all on the same day of the visit for total time 30 minutes spent on the visit.

## 2022-09-04 DIAGNOSIS — I10 Essential (primary) hypertension: Secondary | ICD-10-CM | POA: Diagnosis not present

## 2022-09-04 DIAGNOSIS — E039 Hypothyroidism, unspecified: Secondary | ICD-10-CM | POA: Diagnosis not present

## 2022-09-04 DIAGNOSIS — E559 Vitamin D deficiency, unspecified: Secondary | ICD-10-CM | POA: Diagnosis not present

## 2022-10-26 NOTE — H&P (Signed)
Patient's anticipated LOS is less than 2 midnights, meeting these requirements: - Younger than 5 - Lives within 1 hour of care - Has a competent adult at home to recover with post-op recover - NO history of  - Chronic pain requiring opiods  - Diabetes  - Coronary Artery Disease  - Heart failure  - Heart attack  - Stroke  - DVT/VTE  - Cardiac arrhythmia  - Respiratory Failure/COPD  - Renal failure  - Anemia  - Advanced Liver disease     [name] is an 72 y.o. female.    Chief Complaint: right knee pain  HPI: Pt is a 72 y.o. female complaining of right knee pain for multiple years. Pain had continually increased since the beginning. X-rays in the clinic show end-stage arthritic changes of the right knee. Pt has tried various conservative treatments which have failed to alleviate their symptoms, including injections and therapy. Various options are discussed with the patient. Risks, benefits and expectations were discussed with the patient. Patient understand the risks, benefits and expectations and wishes to proceed with surgery.   PCP:  Myrlene Broker, MD  D/C Plans: Home  PMH: Past Medical History:  Diagnosis Date   Arthritis    Blood in stool    Colon polyps    Hyperlipidemia    Hypertension    Thyroid disease     PSH: Past Surgical History:  Procedure Laterality Date   ABDOMINAL HYSTERECTOMY     KNEE SURGERY Left 2018    Social History:  reports that she has never smoked. She has never used smokeless tobacco. She reports that she does not drink alcohol and does not use drugs. BMI: Estimated body mass index is 28.66 kg/m as calculated from the following:   Height as of 08/15/22: 5\' 7"  (1.702 m).   Weight as of 08/15/22: 83 kg.  Lab Results  Component Value Date   ALBUMIN 3.4 (L) 03/02/2022   Diabetes: Patient does not have a diagnosis of diabetes. Lab Results  Component Value Date   HGBA1C 6.1 03/02/2022     Smoking Status:   reports  that she has never smoked. She has never used smokeless tobacco.    Allergies:  No Known Allergies  Medications: No current facility-administered medications for this encounter.   Current Outpatient Medications  Medication Sig Dispense Refill   B Complex-C (SUPER B COMPLEX PO) Take 1 tablet by mouth daily.      docusate sodium (COLACE) 100 MG capsule Take 100 mg by mouth 2 (two) times daily.      DULoxetine (CYMBALTA) 60 MG capsule TAKE 1 CAPSULE EVERY DAY 90 capsule 3   fluticasone (FLONASE) 50 MCG/ACT nasal spray Place 2 sprays into both nostrils daily. 16 g 3   hydrocortisone (ANUSOL-HC) 2.5 % rectal cream Place 1 Application rectally 2 (two) times daily. 30 g 0   lidocaine (HM LIDOCAINE PATCH) 4 % Place 1 patch onto the skin daily. (Patient not taking: Reported on 03/02/2022) 10 patch 0   loratadine (CLARITIN) 10 MG tablet Take 1 tablet (10 mg total) by mouth daily. (Patient taking differently: Take 10 mg by mouth daily as needed for allergies.) 30 tablet 3   losartan-hydrochlorothiazide (HYZAAR) 50-12.5 MG tablet TAKE 1 TABLET EVERY DAY 90 tablet 3   methocarbamol (ROBAXIN) 500 MG tablet Take 500 mg by mouth every 8 (eight) hours as needed.     Multiple Vitamins-Minerals (MULTIVITAMIN WITH MINERALS) tablet Take 1 tablet by mouth daily.  neomycin-polymyxin-hydrocortisone (CORTISPORIN) OTIC solution Place 3 drops into both ears 3 (three) times daily. 10 mL 0   pantoprazole (PROTONIX) 40 MG tablet TAKE 1 TABLET(40 MG) BY MOUTH DAILY 90 tablet 0   predniSONE (STERAPRED UNI-PAK 21 TAB) 10 MG (21) TBPK tablet Take by mouth daily. Take 6 tabs by mouth daily  for 2 days, then 5 tabs for 2 days, then 4 tabs for 2 days, then 3 tabs for 2 days, 2 tabs for 2 days, then 1 tab by mouth daily for 2 days 42 tablet 0   simvastatin (ZOCOR) 20 MG tablet Take 1 tablet (20 mg total) by mouth daily at 6 PM. 90 tablet 3   SYNTHROID 88 MCG tablet Take 88 mcg by mouth daily.     traZODone (DESYREL) 50 MG  tablet TAKE 1 TABLET (50 MG TOTAL) BY MOUTH AT BEDTIME. 90 tablet 3    No results found for this or any previous visit (from the past 48 hour(s)). No results found.  ROS: Pain with rom of the right lower extremity  Physical Exam: Alert and oriented 72 y.o. female in no acute distress Cranial nerves 2-12 intact Cervical spine: full rom with no tenderness, nv intact distally Chest: active breath sounds bilaterally, no wheeze rhonchi or rales Heart: regular rate and rhythm, no murmur Abd: non tender non distended with active bowel sounds Hip is stable with rom  Right knee painful rom with crepitus Nv intact distally No rashes or edema  Antalgic gait  Assessment/Plan Assessment: right knee end stage osteoarthritis  Plan:  Patient will undergo a right total knee by Dr. Ranell Patrick at [facility] Risks benefits and expectations were discussed with the patient. Patient understand risks, benefits and expectations and wishes to proceed. Preoperative templating of the joint replacement has been completed, documented, and submitted to the Operating Room personnel in order to optimize intra-operative equipment management.   Alphonsa Overall PA-C, MPAS [facility] is now [employer] [address], [place], [place] [address] Phone: [phone] [email or web address] Facebook  [employer]

## 2022-11-07 NOTE — Patient Instructions (Signed)
DUE TO COVID-19 ONLY TWO VISITORS  (aged 72 and older)  ARE ALLOWED TO COME WITH YOU AND STAY IN THE WAITING ROOM ONLY DURING PRE OP AND PROCEDURE.   **NO VISITORS ARE ALLOWED IN THE SHORT STAY AREA OR RECOVERY ROOM!!**  IF YOU WILL BE ADMITTED INTO THE HOSPITAL YOU ARE ALLOWED ONLY FOUR SUPPORT PEOPLE DURING VISITATION HOURS ONLY (7 AM -8PM)   The support person(s) must pass our screening, gel in and out, and wear a mask at all times, including in the patient's room. Patients must also wear a mask when staff or their support person are in the room. Visitors GUEST BADGE MUST BE WORN VISIBLY  One adult visitor may remain with you overnight and MUST be in the room by 8 P.M.     Your procedure is scheduled on: 11/17/22   Report to [facility] Main Entrance    Report to admitting at ; 12:00 PM   Call this number if you have problems the morning of surgery [phone]   Do not eat food :After Midnight.   After Midnight you may have the following liquids until : 11:30 AM DAY OF SURGERY  Water Black Coffee (sugar ok, NO MILK/CREAM OR CREAMERS)  Tea (sugar ok, NO MILK/CREAM OR CREAMERS) regular and decaf                             Plain Jell-O (NO RED)                                           Fruit ices (not with fruit pulp, NO RED)                                     Popsicles (NO RED)                                                                  Juice: apple, WHITE grape, WHITE cranberry Sports drinks like Gatorade (NO RED)   The day of surgery:  Drink ONE (1) Pre-Surgery Clear Ensure at : 11:30 AM the morning of surgery. Drink in one sitting. Do not sip.  This drink was given to you during your hospital  pre-op appointment visit. Nothing else to drink after completing the  Pre-Surgery Clear Ensure or G2.          If you have questions, please contact your surgeon's office.  FOLLOW ANY ADDITIONAL PRE OP INSTRUCTIONS YOU RECEIVED FROM YOUR SURGEON'S OFFICE!!!   Oral  Hygiene is also important to reduce your risk of infection.                                    Remember - BRUSH YOUR TEETH THE MORNING OF SURGERY WITH YOUR REGULAR TOOTHPASTE  DENTURES WILL BE REMOVED PRIOR TO SURGERY PLEASE DO NOT APPLY "Poly grip" OR ADHESIVES!!!   Do NOT smoke after Midnight   Take these medicines the morning of surgery  with A SIP OF WATER: duloxetine,synthroid.                    You may not have any metal on your body including hair pins, jewelry, and body piercing             Do not wear make-up, lotions, powders, perfumes/cologne, or deodorant  Do not wear nail polish including gel and S&S, artificial/acrylic nails, or any other type of covering on natural nails including finger and toenails. If you have artificial nails, gel coating, etc. that needs to be removed by a nail salon please have this removed prior to surgery or surgery may need to be canceled/ delayed if the surgeon/ anesthesia feels like they are unable to be safely monitored.   Do not shave  48 hours prior to surgery.    Do not bring valuables to the hospital. [facility] IS NOT             RESPONSIBLE   FOR VALUABLES.   Contacts, glasses, or bridgework may not be worn into surgery.   Bring small overnight bag day of surgery.   DO NOT BRING YOUR HOME MEDICATIONS TO THE HOSPITAL. PHARMACY WILL DISPENSE MEDICATIONS LISTED ON YOUR MEDICATION LIST TO YOU DURING YOUR ADMISSION IN THE HOSPITAL!    Patients discharged on the day of surgery will not be allowed to drive home.  Someone NEEDS to stay with you for the first 24 hours after anesthesia.   Special Instructions: Bring a copy of your healthcare power of attorney and living will documents         the day of surgery if you haven't scanned them before.              Please read over the following fact sheets you were given: IF YOU HAVE QUESTIONS ABOUT YOUR PRE-OP INSTRUCTIONS PLEASE CALL [phone]      Pre-operative 5 CHG Bath Instructions    You can play a key role in reducing the risk of infection after surgery. Your skin needs to be as free of germs as possible. You can reduce the number of germs on your skin by washing with CHG (chlorhexidine gluconate) soap before surgery. CHG is an antiseptic soap that kills germs and continues to kill germs even after washing.   DO NOT use if you have an allergy to chlorhexidine/CHG or antibacterial soaps. If your skin becomes reddened or irritated, stop using the CHG and notify one of our RNs at : [phone].   Please shower with the CHG soap starting 4 days before surgery using the following schedule:     Please keep in mind the following:  DO NOT shave, including legs and underarms, starting the day of your first shower.   You may shave your face at any point before/day of surgery.  Place clean sheets on your bed the day you start using CHG soap. Use a clean washcloth (not used since being washed) for each shower. DO NOT sleep with pets once you start using the CHG.   CHG Shower Instructions:  If you choose to wash your hair and private area, wash first with your normal shampoo/soap.  After you use shampoo/soap, rinse your hair and body thoroughly to remove shampoo/soap residue.  Turn the water OFF and apply about 3 tablespoons (45 ml) of CHG soap to a CLEAN washcloth.  Apply CHG soap ONLY FROM YOUR NECK DOWN TO YOUR TOES (washing for 3-5 minutes)  DO NOT  use CHG soap on face, private areas, open wounds, or sores.  Pay special attention to the area where your surgery is being performed.  If you are having back surgery, having someone wash your back for you may be helpful. Wait 2 minutes after CHG soap is applied, then you may rinse off the CHG soap.  Pat dry with a clean towel  Put on clean clothes/pajamas   If you choose to wear lotion, please use ONLY the CHG-compatible lotions on the back of this paper.     Additional instructions for the day of surgery: DO NOT APPLY any  lotions, deodorants, cologne, or perfumes.   Put on clean/comfortable clothes.  Brush your teeth.  Ask your nurse before applying any prescription medications to the skin.    CHG Compatible Lotions   Aveeno Moisturizing lotion  Cetaphil Moisturizing Cream  Cetaphil Moisturizing Lotion  Clairol Herbal Essence Moisturizing Lotion, Dry Skin  Clairol Herbal Essence Moisturizing Lotion, Extra Dry Skin  Clairol Herbal Essence Moisturizing Lotion, Normal Skin  Curel Age Defying Therapeutic Moisturizing Lotion with Alpha Hydroxy  Curel Extreme Care Body Lotion  Curel Soothing Hands Moisturizing Hand Lotion  Curel Therapeutic Moisturizing Cream, Fragrance-Free  Curel Therapeutic Moisturizing Lotion, Fragrance-Free  Curel Therapeutic Moisturizing Lotion, Original Formula  Eucerin Daily Replenishing Lotion  Eucerin Dry Skin Therapy Plus Alpha Hydroxy Crme  Eucerin Dry Skin Therapy Plus Alpha Hydroxy Lotion  Eucerin Original Crme  Eucerin Original Lotion  Eucerin Plus Crme Eucerin Plus Lotion  Eucerin TriLipid Replenishing Lotion  Keri Anti-Bacterial Hand Lotion  Keri Deep Conditioning Original Lotion Dry Skin Formula Softly Scented  Keri Deep Conditioning Original Lotion, Fragrance Free Sensitive Skin Formula  Keri Lotion Fast Absorbing Fragrance Free Sensitive Skin Formula  Keri Lotion Fast Absorbing Softly Scented Dry Skin Formula  Keri Original Lotion  Keri Skin Renewal Lotion Keri Silky Smooth Lotion  Keri Silky Smooth Sensitive Skin Lotion  Nivea Body Creamy Conditioning Oil  Nivea Body Extra Enriched Lotion  Nivea Body Original Lotion  Nivea Body Sheer Moisturizing Lotion Nivea Crme  Nivea Skin Firming Lotion  NutraDerm 30 Skin Lotion  NutraDerm Skin Lotion  NutraDerm Therapeutic Skin Cream  NutraDerm Therapeutic Skin Lotion  ProShield Protective Hand Cream  Provon moisturizing lotion   Incentive Spirometer  An incentive spirometer is a tool that can help keep your  lungs clear and active. This tool measures how well you are filling your lungs with each breath. Taking long deep breaths may help reverse or decrease the chance of developing breathing (pulmonary) problems (especially infection) following: A long period of time when you are unable to move or be active. BEFORE THE PROCEDURE  If the spirometer includes an indicator to show your best effort, your nurse or respiratory therapist will set it to a desired goal. If possible, sit up straight or lean slightly forward. Try not to slouch. Hold the incentive spirometer in an upright position. INSTRUCTIONS FOR USE  Sit on the edge of your bed if possible, or sit up as far as you can in bed or on a chair. Hold the incentive spirometer in an upright position. Breathe out normally. Place the mouthpiece in your mouth and seal your lips tightly around it. Breathe in slowly and as deeply as possible, raising the piston or the ball toward the top of the column. Hold your breath for 3-5 seconds or for as long as possible. Allow the piston or ball to fall to the bottom of the column. Remove the mouthpiece  from your mouth and breathe out normally. Rest for a few seconds and repeat Steps 1 through 7 at least 10 times every 1-2 hours when you are awake. Take your time and take a few normal breaths between deep breaths. The spirometer may include an indicator to show your best effort. Use the indicator as a goal to work toward during each repetition. After each set of 10 deep breaths, practice coughing to be sure your lungs are clear. If you have an incision (the cut made at the time of surgery), support your incision when coughing by placing a pillow or rolled up towels firmly against it. Once you are able to get out of bed, walk around indoors and cough well. You may stop using the incentive spirometer when instructed by your caregiver.  RISKS AND COMPLICATIONS Take your time so you do not get dizzy or light-headed. If  you are in pain, you may need to take or ask for pain medication before doing incentive spirometry. It is harder to take a deep breath if you are having pain. AFTER USE Rest and breathe slowly and easily. It can be helpful to keep track of a log of your progress. Your caregiver can provide you with a simple table to help with this. If you are using the spirometer at home, follow these instructions: SEEK MEDICAL CARE IF:  You are having difficultly using the spirometer. You have trouble using the spirometer as often as instructed. Your pain medication is not giving enough relief while using the spirometer. You develop fever of 100.5 F (38.1 C) or higher. SEEK IMMEDIATE MEDICAL CARE IF:  You cough up bloody sputum that had not been present before. You develop fever of 102 F (38.9 C) or greater. You develop worsening pain at or near the incision site. MAKE SURE YOU:  Understand these instructions. Will watch your condition. Will get help right away if you are not doing well or get worse. Document Released: 07/10/2006 Document Revised: 05/22/2011 Document Reviewed: 09/10/2006 [facility] Patient Information 2014 [place], [place].   ________________________________________________________________________

## 2022-11-08 ENCOUNTER — Other Ambulatory Visit: Payer: Self-pay

## 2022-11-08 ENCOUNTER — Encounter (HOSPITAL_COMMUNITY)
Admission: RE | Admit: 2022-11-08 | Discharge: 2022-11-08 | Disposition: A | Discharge status: Home / Self Care | Payer: Medicare PPO | Source: Ambulatory Visit | Attending: Orthopedic Surgery | Admitting: Orthopedic Surgery

## 2022-11-08 ENCOUNTER — Encounter (HOSPITAL_COMMUNITY): Payer: Self-pay

## 2022-11-08 VITALS — BP 137/72 | HR 74 | Temp 97.9°F | Ht 67.0 in | Wt 179.0 lb

## 2022-11-08 DIAGNOSIS — Z01818 Encounter for other preprocedural examination: Secondary | ICD-10-CM

## 2022-11-08 DIAGNOSIS — I10 Essential (primary) hypertension: Secondary | ICD-10-CM | POA: Insufficient documentation

## 2022-11-08 DIAGNOSIS — Z01812 Encounter for preprocedural laboratory examination: Secondary | ICD-10-CM | POA: Diagnosis not present

## 2022-11-08 HISTORY — DX: Anxiety disorder, unspecified: F41.9

## 2022-11-08 LAB — CBC
HCT: 42.6 % (ref 36.0–46.0)
Hemoglobin: 13.8 g/dL (ref 12.0–15.0)
MCH: 30.5 pg (ref 26.0–34.0)
MCHC: 32.4 g/dL (ref 30.0–36.0)
MCV: 94 fL (ref 80.0–100.0)
Platelets: 251 10*3/uL (ref 150–400)
RBC: 4.53 MIL/uL (ref 3.87–5.11)
RDW: 14.1 % (ref 11.5–15.5)
WBC: 5.3 10*3/uL (ref 4.0–10.5)
nRBC: 0 % (ref 0.0–0.2)

## 2022-11-08 LAB — BASIC METABOLIC PANEL
Anion gap: 7 (ref 5–15)
BUN: 17 mg/dL (ref 8–23)
CO2: 26 mmol/L (ref 22–32)
Calcium: 9.3 mg/dL (ref 8.9–10.3)
Chloride: 105 mmol/L (ref 98–111)
Creatinine, Ser: 0.84 mg/dL (ref 0.44–1.00)
GFR, Estimated: >60 mL/min (ref >60)
Glucose, Bld: 105 mg/dL — ABNORMAL HIGH (ref 70–99)
Potassium: 4.1 mmol/L (ref 3.5–5.1)
Sodium: 138 mmol/L (ref 135–145)

## 2022-11-08 LAB — SURGICAL PCR SCREEN
MRSA, PCR: NEGATIVE
Staphylococcus aureus: NEGATIVE

## 2022-11-08 NOTE — Progress Notes (Signed)
For Short Stay: COVID SWAB appointment date:  Bowel Prep reminder:   For Anesthesia: PCP - Myrlene Broker, MD . Theron Arista: 08/15/22: Clearance. Cardiologist - N/A  Chest x-ray -  EKG - 08/15/22 Stress Test - 06/14/17 ECHO -  Cardiac Cath -  Pacemaker/ICD device last checked: Pacemaker orders received: Device Rep notified:  Spinal Cord Stimulator: N/A  Sleep Study - N/A CPAP -   Fasting Blood Sugar - N/A Checks Blood Sugar _____ times a day Date and result of last Hgb A1c-  Last dose of GLP1 agonist- N/A GLP1 instructions:   Last dose of SGLT-2 inhibitors- N/A SGLT-2 instructions:   Blood Thinner Instructions: Aspirin Instructions: Last Dose:  Activity level: Can go up a flight of stairs and activities of daily living without stopping and without chest pain and/or shortness of breath   Able to exercise without chest pain and/or shortness of breath  Anesthesia review: Hx: HTN  Patient denies shortness of breath, fever, cough and chest pain at PAT appointment   Patient verbalized understanding of instructions that were given to them at the PAT appointment. Patient was also instructed that they will need to review over the PAT instructions again at home before surgery.

## 2022-11-16 NOTE — Anesthesia Preprocedure Evaluation (Addendum)
Anesthesia Evaluation  Patient identified by MRN, date of birth, ID band Patient awake    Reviewed: Allergy & Precautions, NPO status , Patient's Chart, lab work & pertinent test results  History of Anesthesia Complications Negative for: history of anesthetic complications  Airway Mallampati: II  TM Distance: >3 FB Neck ROM: Full    Dental no notable dental hx.    Pulmonary neg pulmonary ROS   Pulmonary exam normal        Cardiovascular hypertension, Pt. on medications Normal cardiovascular exam     Neuro/Psych   Anxiety Depression       GI/Hepatic negative GI ROS, Neg liver ROS,,,  Endo/Other  Hypothyroidism    Renal/GU negative Renal ROS     Musculoskeletal  (+) Arthritis ,    Abdominal   Peds  Hematology negative hematology ROS (+)   Anesthesia Other Findings Day of surgery medications reviewed with patient.  Reproductive/Obstetrics                              Anesthesia Physical Anesthesia Plan  ASA: 2  Anesthesia Plan: Spinal   Post-op Pain Management:  Regional for Post-op pain and Tylenol PO (pre-op)* and Regional block*   Induction:   PONV Risk Score and Plan: 3 and Treatment may vary due to age or medical condition, Ondansetron, Propofol infusion and Dexamethasone  Airway Management Planned: Natural Airway and Simple Face Mask  Additional Equipment: None  Intra-op Plan:   Post-operative Plan:   Informed Consent: I have reviewed the patients History and Physical, chart, labs and discussed the procedure including the risks, benefits and alternatives for the proposed anesthesia with the patient or authorized representative who has indicated his/her understanding and acceptance.       Plan Discussed with: CRNA  Anesthesia Plan Comments:         Anesthesia Quick Evaluation

## 2022-11-17 ENCOUNTER — Inpatient Hospital Stay (HOSPITAL_COMMUNITY)
Admission: RE | Admit: 2022-11-17 | Discharge: 2022-11-20 | DRG: 470 | Disposition: A | Discharge status: Home / Self Care | Payer: Medicare PPO | Source: Ambulatory Visit | Attending: Orthopedic Surgery | Admitting: Orthopedic Surgery

## 2022-11-17 ENCOUNTER — Other Ambulatory Visit: Payer: Self-pay

## 2022-11-17 ENCOUNTER — Encounter (HOSPITAL_COMMUNITY)
Admission: RE | Disposition: A | Discharge status: Home / Self Care | Payer: Self-pay | Source: Ambulatory Visit | Attending: Orthopedic Surgery

## 2022-11-17 ENCOUNTER — Encounter (HOSPITAL_COMMUNITY): Payer: Self-pay | Admitting: Orthopedic Surgery

## 2022-11-17 ENCOUNTER — Ambulatory Visit (HOSPITAL_COMMUNITY): Payer: Medicare PPO | Admitting: Anesthesiology

## 2022-11-17 DIAGNOSIS — Z79890 Hormone replacement therapy (postmenopausal): Secondary | ICD-10-CM

## 2022-11-17 DIAGNOSIS — M1711 Unilateral primary osteoarthritis, right knee: Secondary | ICD-10-CM

## 2022-11-17 DIAGNOSIS — I10 Essential (primary) hypertension: Secondary | ICD-10-CM | POA: Diagnosis present

## 2022-11-17 DIAGNOSIS — E785 Hyperlipidemia, unspecified: Secondary | ICD-10-CM | POA: Diagnosis present

## 2022-11-17 DIAGNOSIS — Z90710 Acquired absence of both cervix and uterus: Secondary | ICD-10-CM

## 2022-11-17 DIAGNOSIS — Z79899 Other long term (current) drug therapy: Secondary | ICD-10-CM | POA: Diagnosis not present

## 2022-11-17 DIAGNOSIS — G8918 Other acute postprocedural pain: Secondary | ICD-10-CM | POA: Diagnosis not present

## 2022-11-17 DIAGNOSIS — Z96651 Presence of right artificial knee joint: Principal | ICD-10-CM

## 2022-11-17 DIAGNOSIS — E1165 Type 2 diabetes mellitus with hyperglycemia: Secondary | ICD-10-CM | POA: Diagnosis present

## 2022-11-17 DIAGNOSIS — E039 Hypothyroidism, unspecified: Secondary | ICD-10-CM | POA: Diagnosis present

## 2022-11-17 DIAGNOSIS — F418 Other specified anxiety disorders: Secondary | ICD-10-CM | POA: Diagnosis present

## 2022-11-17 HISTORY — PX: TOTAL KNEE ARTHROPLASTY: SHX125

## 2022-11-17 SURGERY — ARTHROPLASTY, KNEE, TOTAL
Anesthesia: Spinal | Site: Knee | Laterality: Right

## 2022-11-17 MED ORDER — TRANEXAMIC ACID-NACL 1000-0.7 MG/100ML-% IV SOLN
1000.0000 mg | Freq: Once | INTRAVENOUS | Status: AC
  Administered 2022-11-17: 1000 mg via INTRAVENOUS
  Filled 2022-11-17: qty 100

## 2022-11-17 MED ORDER — ONDANSETRON HCL 4 MG/2ML IJ SOLN
INTRAMUSCULAR | Status: AC
  Filled 2022-11-17: qty 2

## 2022-11-17 MED ORDER — CARBOXYMETHYLCELLULOSE SODIUM 0.5 % OP SOLN: 1.0000 [drp] | Freq: Three times a day (TID) | OPHTHALMIC | Status: DC | PRN

## 2022-11-17 MED ORDER — SODIUM CHLORIDE (PF) 0.9 % IJ SOLN
INTRAMUSCULAR | Status: AC
  Filled 2022-11-17: qty 50

## 2022-11-17 MED ORDER — LEVOTHYROXINE SODIUM 88 MCG PO TABS
88.0000 ug | ORAL_TABLET | Freq: Every day | ORAL | Status: DC
  Administered 2022-11-18 – 2022-11-20 (×3): 88 ug via ORAL
  Filled 2022-11-17 (×3): qty 1

## 2022-11-17 MED ORDER — LIDOCAINE HCL (PF) 2 % IJ SOLN
INTRAMUSCULAR | Status: AC
  Filled 2022-11-17: qty 5

## 2022-11-17 MED ORDER — DOCUSATE SODIUM 100 MG PO CAPS: 100.0000 mg | ORAL_CAPSULE | Freq: Every day | ORAL | Status: DC

## 2022-11-17 MED ORDER — PHENOL 1.4 % MT LIQD: 1.0000 | OROMUCOSAL | Status: DC | PRN

## 2022-11-17 MED ORDER — FENTANYL CITRATE PF 50 MCG/ML IJ SOSY
100.0000 ug | PREFILLED_SYRINGE | INTRAMUSCULAR | Status: DC
  Administered 2022-11-17: 50 ug via INTRAVENOUS
  Filled 2022-11-17: qty 2

## 2022-11-17 MED ORDER — NEOMYCIN-POLYMYXIN-HC 3.5-10000-1 OT SOLN: 3.0000 [drp] | Freq: Three times a day (TID) | OTIC | Status: DC

## 2022-11-17 MED ORDER — ONDANSETRON HCL 4 MG/2ML IJ SOLN: 4.0000 mg | Freq: Four times a day (QID) | INTRAMUSCULAR | Status: DC | PRN

## 2022-11-17 MED ORDER — DULOXETINE HCL 60 MG PO CPEP
60.0000 mg | ORAL_CAPSULE | Freq: Every day | ORAL | Status: DC
  Administered 2022-11-18 – 2022-11-20 (×3): 60 mg via ORAL
  Filled 2022-11-17 (×3): qty 1

## 2022-11-17 MED ORDER — LOSARTAN POTASSIUM-HCTZ 50-12.5 MG PO TABS: 1.0000 | ORAL_TABLET | Freq: Every day | ORAL | Status: DC

## 2022-11-17 MED ORDER — METHOCARBAMOL 500 MG IVPB - SIMPLE MED
INTRAVENOUS | Status: AC
  Filled 2022-11-17: qty 55

## 2022-11-17 MED ORDER — TRANEXAMIC ACID-NACL 1000-0.7 MG/100ML-% IV SOLN
1000.0000 mg | INTRAVENOUS | Status: AC
  Administered 2022-11-17: 1000 mg via INTRAVENOUS
  Filled 2022-11-17: qty 100

## 2022-11-17 MED ORDER — BUPIVACAINE LIPOSOME 1.3 % IJ SUSP
INTRAMUSCULAR | Status: DC | PRN
  Administered 2022-11-17: 20 mL

## 2022-11-17 MED ORDER — METOCLOPRAMIDE HCL 5 MG PO TABS: 5.0000 mg | ORAL_TABLET | Freq: Three times a day (TID) | ORAL | Status: DC | PRN

## 2022-11-17 MED ORDER — SODIUM CHLORIDE 0.9 % IV SOLN: INTRAVENOUS | Status: DC

## 2022-11-17 MED ORDER — OXYCODONE-ACETAMINOPHEN 5-325 MG PO TABS
1.0000 | ORAL_TABLET | ORAL | 0 refills | Status: DC | PRN
Start: 2022-11-17 — End: 2023-03-15

## 2022-11-17 MED ORDER — HYDROMORPHONE HCL 1 MG/ML IJ SOLN
0.5000 mg | INTRAMUSCULAR | Status: DC | PRN
  Administered 2022-11-18: 0.5 mg via INTRAVENOUS
  Filled 2022-11-17: qty 1

## 2022-11-17 MED ORDER — ADULT MULTIVITAMIN W/MINERALS CH
1.0000 | ORAL_TABLET | Freq: Every day | ORAL | Status: DC
  Administered 2022-11-18 – 2022-11-20 (×3): 1 via ORAL
  Filled 2022-11-17 (×3): qty 1

## 2022-11-17 MED ORDER — CEFAZOLIN SODIUM-DEXTROSE 2-4 GM/100ML-% IV SOLN
2.0000 g | Freq: Four times a day (QID) | INTRAVENOUS | Status: AC
  Administered 2022-11-17 – 2022-11-18 (×2): 2 g via INTRAVENOUS
  Filled 2022-11-17 (×2): qty 100

## 2022-11-17 MED ORDER — CHLORHEXIDINE GLUCONATE 0.12 % MT SOLN
15.0000 mL | Freq: Once | OROMUCOSAL | Status: AC
  Administered 2022-11-17: 15 mL via OROMUCOSAL

## 2022-11-17 MED ORDER — VITAMIN D 25 MCG (1000 UNIT) PO TABS
5000.0000 [IU] | ORAL_TABLET | Freq: Every day | ORAL | Status: DC
  Administered 2022-11-18 – 2022-11-20 (×3): 5000 [IU] via ORAL
  Filled 2022-11-17 (×3): qty 5

## 2022-11-17 MED ORDER — SIMVASTATIN 20 MG PO TABS
20.0000 mg | ORAL_TABLET | Freq: Every day | ORAL | Status: DC
  Administered 2022-11-18 – 2022-11-19 (×2): 20 mg via ORAL
  Filled 2022-11-17 (×2): qty 1

## 2022-11-17 MED ORDER — OXYCODONE HCL 5 MG/5ML PO SOLN: 5.0000 mg | Freq: Once | ORAL | Status: DC | PRN

## 2022-11-17 MED ORDER — PHENYLEPHRINE HCL-NACL 20-0.9 MG/250ML-% IV SOLN
INTRAVENOUS | Status: DC | PRN
Start: 2022-11-17 — End: 2022-11-17
  Administered 2022-11-17: 30 ug/min via INTRAVENOUS

## 2022-11-17 MED ORDER — PROPOFOL 500 MG/50ML IV EMUL
INTRAVENOUS | Status: DC | PRN
  Administered 2022-11-17: 70 ug/kg/min via INTRAVENOUS

## 2022-11-17 MED ORDER — POVIDONE-IODINE 10 % EX SWAB: 2.0000 | Freq: Once | CUTANEOUS | Status: DC

## 2022-11-17 MED ORDER — ONDANSETRON HCL 4 MG PO TABS
4.0000 mg | ORAL_TABLET | Freq: Four times a day (QID) | ORAL | Status: DC | PRN
  Filled 2022-11-17 (×2): qty 1

## 2022-11-17 MED ORDER — DOCUSATE SODIUM 100 MG PO CAPS
100.0000 mg | ORAL_CAPSULE | Freq: Two times a day (BID) | ORAL | Status: DC
  Administered 2022-11-17 – 2022-11-20 (×6): 100 mg via ORAL
  Filled 2022-11-17 (×6): qty 1

## 2022-11-17 MED ORDER — ORAL CARE MOUTH RINSE: 15.0000 mL | Freq: Once | OROMUCOSAL | Status: AC

## 2022-11-17 MED ORDER — PROPOFOL 1000 MG/100ML IV EMUL
INTRAVENOUS | Status: AC
  Filled 2022-11-17: qty 100

## 2022-11-17 MED ORDER — ACETAMINOPHEN 500 MG PO TABS
1000.0000 mg | ORAL_TABLET | Freq: Once | ORAL | Status: AC
  Administered 2022-11-17: 1000 mg via ORAL
  Filled 2022-11-17: qty 2

## 2022-11-17 MED ORDER — LACTATED RINGERS IV SOLN: INTRAVENOUS | Status: DC

## 2022-11-17 MED ORDER — PROPOFOL 10 MG/ML IV BOLUS
INTRAVENOUS | Status: DC | PRN
Start: 2022-11-17 — End: 2022-11-17
  Administered 2022-11-17: 50 mg via INTRAVENOUS

## 2022-11-17 MED ORDER — SODIUM CHLORIDE 0.9 % IR SOLN
Status: DC | PRN
  Administered 2022-11-17: 1000 mL

## 2022-11-17 MED ORDER — BUPIVACAINE-EPINEPHRINE (PF) 0.5% -1:200000 IJ SOLN
INTRAMUSCULAR | Status: DC | PRN
Start: 2022-11-17 — End: 2022-11-17
  Administered 2022-11-17: 15 mL via PERINEURAL

## 2022-11-17 MED ORDER — LIDOCAINE HCL (CARDIAC) PF 100 MG/5ML IV SOSY
PREFILLED_SYRINGE | INTRAVENOUS | Status: DC | PRN
Start: 2022-11-17 — End: 2022-11-17
  Administered 2022-11-17: 60 mg via INTRAVENOUS

## 2022-11-17 MED ORDER — OXYCODONE HCL 5 MG PO TABS: 5.0000 mg | ORAL_TABLET | Freq: Once | ORAL | Status: DC | PRN

## 2022-11-17 MED ORDER — FLUTICASONE PROPIONATE 50 MCG/ACT NA SUSP: 2.0000 | Freq: Every day | NASAL | Status: DC

## 2022-11-17 MED ORDER — ASPIRIN 81 MG PO CHEW
81.0000 mg | CHEWABLE_TABLET | Freq: Two times a day (BID) | ORAL | Status: DC
  Administered 2022-11-18 – 2022-11-20 (×5): 81 mg via ORAL
  Filled 2022-11-17 (×5): qty 1

## 2022-11-17 MED ORDER — PHENYLEPHRINE HCL-NACL 20-0.9 MG/250ML-% IV SOLN
INTRAVENOUS | Status: AC
  Filled 2022-11-17: qty 250

## 2022-11-17 MED ORDER — CEFAZOLIN SODIUM-DEXTROSE 2-4 GM/100ML-% IV SOLN
2.0000 g | INTRAVENOUS | Status: AC
  Administered 2022-11-17: 2 g via INTRAVENOUS
  Filled 2022-11-17: qty 100

## 2022-11-17 MED ORDER — MIDAZOLAM HCL 2 MG/2ML IJ SOLN: 2.0000 mg | INTRAMUSCULAR | Status: DC

## 2022-11-17 MED ORDER — METHOCARBAMOL 500 MG PO TABS: 500.0000 mg | ORAL_TABLET | Freq: Three times a day (TID) | ORAL | 1 refills | Status: DC | PRN

## 2022-11-17 MED ORDER — ONDANSETRON HCL 4 MG/2ML IJ SOLN
INTRAMUSCULAR | Status: DC | PRN
  Administered 2022-11-17: 4 mg via INTRAVENOUS

## 2022-11-17 MED ORDER — BUPIVACAINE LIPOSOME 1.3 % IJ SUSP
INTRAMUSCULAR | Status: AC
  Filled 2022-11-17: qty 20

## 2022-11-17 MED ORDER — BUPIVACAINE-EPINEPHRINE 0.25% -1:200000 IJ SOLN
INTRAMUSCULAR | Status: DC | PRN
  Administered 2022-11-17: 30 mL

## 2022-11-17 MED ORDER — MENTHOL 3 MG MT LOZG: 1.0000 | LOZENGE | OROMUCOSAL | Status: DC | PRN

## 2022-11-17 MED ORDER — POLYETHYLENE GLYCOL 3350 17 G PO PACK
17.0000 g | PACK | Freq: Every day | ORAL | Status: DC | PRN
  Administered 2022-11-18 – 2022-11-19 (×2): 17 g via ORAL
  Filled 2022-11-17: qty 1

## 2022-11-17 MED ORDER — BUPIVACAINE-EPINEPHRINE 0.25% -1:200000 IJ SOLN
INTRAMUSCULAR | Status: AC
  Filled 2022-11-17: qty 1

## 2022-11-17 MED ORDER — ASPIRIN 81 MG PO CHEW: 81.0000 mg | CHEWABLE_TABLET | Freq: Two times a day (BID) | ORAL | 0 refills | Status: AC

## 2022-11-17 MED ORDER — METOCLOPRAMIDE HCL 5 MG/ML IJ SOLN: 5.0000 mg | Freq: Three times a day (TID) | INTRAMUSCULAR | Status: DC | PRN

## 2022-11-17 MED ORDER — 0.9 % SODIUM CHLORIDE (POUR BTL) OPTIME
TOPICAL | Status: DC | PRN
  Administered 2022-11-17: 1000 mL

## 2022-11-17 MED ORDER — FENTANYL CITRATE PF 50 MCG/ML IJ SOSY
PREFILLED_SYRINGE | INTRAMUSCULAR | Status: AC
  Filled 2022-11-17: qty 1

## 2022-11-17 MED ORDER — LOSARTAN POTASSIUM 50 MG PO TABS
50.0000 mg | ORAL_TABLET | Freq: Every day | ORAL | Status: DC
  Administered 2022-11-18 – 2022-11-20 (×2): 50 mg via ORAL
  Filled 2022-11-17 (×2): qty 1

## 2022-11-17 MED ORDER — DEXAMETHASONE SODIUM PHOSPHATE 10 MG/ML IJ SOLN
INTRAMUSCULAR | Status: DC | PRN
Start: 2022-11-17 — End: 2022-11-17
  Administered 2022-11-17: 8 mg via INTRAVENOUS

## 2022-11-17 MED ORDER — DROPERIDOL 2.5 MG/ML IJ SOLN: 0.6250 mg | Freq: Once | INTRAMUSCULAR | Status: DC | PRN

## 2022-11-17 MED ORDER — OXYCODONE HCL 5 MG PO TABS
5.0000 mg | ORAL_TABLET | ORAL | Status: DC | PRN
  Administered 2022-11-17 – 2022-11-20 (×9): 10 mg via ORAL
  Filled 2022-11-17 (×9): qty 2

## 2022-11-17 MED ORDER — HYDROCHLOROTHIAZIDE 12.5 MG PO TABS
12.5000 mg | ORAL_TABLET | Freq: Every day | ORAL | Status: DC
  Administered 2022-11-18 – 2022-11-20 (×2): 12.5 mg via ORAL
  Filled 2022-11-17 (×2): qty 1

## 2022-11-17 MED ORDER — FENTANYL CITRATE PF 50 MCG/ML IJ SOSY
25.0000 ug | PREFILLED_SYRINGE | INTRAMUSCULAR | Status: DC | PRN
  Administered 2022-11-17: 50 ug via INTRAVENOUS

## 2022-11-17 MED ORDER — CLONIDINE HCL (ANALGESIA) 100 MCG/ML EP SOLN
EPIDURAL | Status: DC | PRN
Start: 2022-11-17 — End: 2022-11-17
  Administered 2022-11-17: 100 ug

## 2022-11-17 MED ORDER — SODIUM CHLORIDE (PF) 0.9 % IJ SOLN
INTRAMUSCULAR | Status: DC | PRN
  Administered 2022-11-17: 30 mL

## 2022-11-17 MED ORDER — TRAZODONE HCL 50 MG PO TABS
50.0000 mg | ORAL_TABLET | Freq: Every day | ORAL | Status: DC
  Administered 2022-11-17 – 2022-11-19 (×3): 50 mg via ORAL
  Filled 2022-11-17 (×3): qty 1

## 2022-11-17 MED ORDER — BISACODYL 10 MG RE SUPP: 10.0000 mg | Freq: Every day | RECTAL | Status: DC | PRN

## 2022-11-17 MED ORDER — VITAMIN C 500 MG PO TABS
1000.0000 mg | ORAL_TABLET | Freq: Every day | ORAL | Status: DC
  Administered 2022-11-18 – 2022-11-20 (×3): 1000 mg via ORAL
  Filled 2022-11-17 (×3): qty 2

## 2022-11-17 MED ORDER — DEXAMETHASONE SODIUM PHOSPHATE 10 MG/ML IJ SOLN
INTRAMUSCULAR | Status: AC
  Filled 2022-11-17: qty 1

## 2022-11-17 MED ORDER — METHOCARBAMOL 500 MG IVPB - SIMPLE MED
500.0000 mg | Freq: Four times a day (QID) | INTRAVENOUS | Status: DC | PRN
  Administered 2022-11-17: 500 mg via INTRAVENOUS

## 2022-11-17 MED ORDER — ONDANSETRON HCL 4 MG PO TABS: 4.0000 mg | ORAL_TABLET | Freq: Three times a day (TID) | ORAL | 1 refills | Status: AC | PRN

## 2022-11-17 MED ORDER — METHOCARBAMOL 500 MG PO TABS
500.0000 mg | ORAL_TABLET | Freq: Four times a day (QID) | ORAL | Status: DC | PRN
  Administered 2022-11-17 – 2022-11-18 (×2): 500 mg via ORAL
  Filled 2022-11-17 (×2): qty 1

## 2022-11-17 MED ORDER — BUPIVACAINE LIPOSOME 1.3 % IJ SUSP: 20.0000 mL | Freq: Once | INTRAMUSCULAR | Status: DC

## 2022-11-17 MED ORDER — WATER FOR IRRIGATION, STERILE IR SOLN
Status: DC | PRN
  Administered 2022-11-17: 2000 mL

## 2022-11-17 MED ORDER — ACETAMINOPHEN 325 MG PO TABS
325.0000 mg | ORAL_TABLET | Freq: Four times a day (QID) | ORAL | Status: DC | PRN
  Administered 2022-11-18: 650 mg via ORAL
  Filled 2022-11-17: qty 2

## 2022-11-17 SURGICAL SUPPLY — 59 items
APL SKNCLS STERI-STRIP NONHPOA (GAUZE/BANDAGES/DRESSINGS) ×2
ATTUNE PSFEM RTSZ6 NARCEM KNEE (Femur) IMPLANT
ATTUNE PSRP INSR SZ6 7 KNEE (Insert) IMPLANT
BAG COUNTER SPONGE SURGICOUNT (BAG) IMPLANT
BAG SPEC THK2 15X12 ZIP CLS (MISCELLANEOUS)
BAG SPNG CNTER NS LX DISP (BAG)
BAG ZIPLOCK 12X15 (MISCELLANEOUS) IMPLANT
BASE TIBIAL ROT PLAT SZ 5 KNEE (Knees) IMPLANT
BENZOIN TINCTURE PRP APPL 2/3 (GAUZE/BANDAGES/DRESSINGS) IMPLANT
BLADE SAG 18X100X1.27 (BLADE) ×2 IMPLANT
BLADE SAW SGTL 13X75X1.27 (BLADE) ×2 IMPLANT
BNDG CMPR 6 X 5 YARDS HK CLSR (GAUZE/BANDAGES/DRESSINGS) ×1
BNDG CMPR MED 10X6 ELC LF (GAUZE/BANDAGES/DRESSINGS) ×1
BNDG ELASTIC 6INX 5YD STR LF (GAUZE/BANDAGES/DRESSINGS) IMPLANT
BNDG ELASTIC 6X10 VLCR STRL LF (GAUZE/BANDAGES/DRESSINGS) ×2 IMPLANT
BNDG GAUZE DERMACEA FLUFF 4 (GAUZE/BANDAGES/DRESSINGS) ×2 IMPLANT
BNDG GZE DERMACEA 4 6PLY (GAUZE/BANDAGES/DRESSINGS) ×1
BOWL SMART MIX CTS (DISPOSABLE) ×2 IMPLANT
BSPLAT TIB 5 CMNT ROT PLAT STR (Knees) ×1 IMPLANT
CEMENT HV SMART SET (Cement) ×4 IMPLANT
COVER SURGICAL LIGHT HANDLE (MISCELLANEOUS) ×2 IMPLANT
CUFF TOURN SGL QUICK 34 (TOURNIQUET CUFF) ×1
CUFF TRNQT CYL 34X4.125X (TOURNIQUET CUFF) ×2 IMPLANT
DRAPE INCISE IOBAN 66X45 STRL (DRAPES) IMPLANT
DRAPE SHEET LG 3/4 BI-LAMINATE (DRAPES) ×2 IMPLANT
DRAPE U-SHAPE 47X51 STRL (DRAPES) ×2 IMPLANT
DRSG ADAPTIC 3X8 NADH LF (GAUZE/BANDAGES/DRESSINGS) ×2 IMPLANT
DURAPREP 26ML APPLICATOR (WOUND CARE) ×2 IMPLANT
ELECT REM PT RETURN 15FT ADLT (MISCELLANEOUS) ×2 IMPLANT
GAUZE PAD ABD 8X10 STRL (GAUZE/BANDAGES/DRESSINGS) ×2 IMPLANT
GAUZE SPONGE 4X4 12PLY STRL (GAUZE/BANDAGES/DRESSINGS) ×2 IMPLANT
GLOVE BIOGEL PI IND STRL 7.5 (GLOVE) ×2 IMPLANT
GLOVE BIOGEL PI IND STRL 8.5 (GLOVE) ×2 IMPLANT
GLOVE ORTHO TXT STRL SZ7.5 (GLOVE) ×2 IMPLANT
GLOVE SURG ORTHO 8.5 STRL (GLOVE) ×2 IMPLANT
GOWN STRL REUS W/ TWL XL LVL3 (GOWN DISPOSABLE) ×4 IMPLANT
GOWN STRL REUS W/TWL XL LVL3 (GOWN DISPOSABLE) ×2
HANDPIECE INTERPULSE COAX TIP (DISPOSABLE) ×1
HOLDER FOLEY CATH W/STRAP (MISCELLANEOUS) IMPLANT
IMMOBILIZER KNEE 20 (SOFTGOODS) ×1
IMMOBILIZER KNEE 20 THIGH 36 (SOFTGOODS) IMPLANT
KIT TURNOVER KIT A (KITS) IMPLANT
MANIFOLD NEPTUNE II (INSTRUMENTS) ×2 IMPLANT
NS IRRIG 1000ML POUR BTL (IV SOLUTION) ×2 IMPLANT
PACK TOTAL KNEE CUSTOM (KITS) ×2 IMPLANT
PATELLA MEDIAL ATTUN 35MM KNEE (Knees) IMPLANT
PIN STEINMAN FIXATION KNEE (PIN) IMPLANT
PROTECTOR NERVE ULNAR (MISCELLANEOUS) ×2 IMPLANT
SET HNDPC FAN SPRY TIP SCT (DISPOSABLE) ×2 IMPLANT
STRIP CLOSURE SKIN 1/2X4 (GAUZE/BANDAGES/DRESSINGS) ×4 IMPLANT
SUT MNCRL AB 3-0 PS2 18 (SUTURE) ×2 IMPLANT
SUT VIC AB 0 CT1 36 (SUTURE) ×2 IMPLANT
SUT VIC AB 1 CT1 36 (SUTURE) ×4 IMPLANT
SUT VIC AB 2-0 CT1 27 (SUTURE) ×1
SUT VIC AB 2-0 CT1 TAPERPNT 27 (SUTURE) ×2 IMPLANT
TIBIAL BASE ROT PLAT SZ 5 KNEE (Knees) ×1 IMPLANT
TRAY CATH INTERMITTENT SS 16FR (CATHETERS) ×2 IMPLANT
WATER STERILE IRR 1000ML POUR (IV SOLUTION) ×4 IMPLANT
YANKAUER SUCT BULB TIP NO VENT (SUCTIONS) ×2 IMPLANT

## 2022-11-17 NOTE — Anesthesia Procedure Notes (Signed)
Spinal  Patient location during procedure: OR Start time: 11/17/2022 1:22 PM End time: 11/17/2022 1:26 PM Reason for block: surgical anesthesia Staffing Performed: anesthesiologist  Anesthesiologist: Kaylyn Layer, MD Performed by: Kaylyn Layer, MD Authorized by: Kaylyn Layer, MD   Preanesthetic Checklist Completed: patient identified, IV checked, risks and benefits discussed, surgical consent, monitors and equipment checked, pre-op evaluation and timeout performed Spinal Block Patient position: sitting Prep: DuraPrep and site prepped and draped Patient monitoring: continuous pulse ox, blood pressure and heart rate Approach: midline Location: L3-4 Injection technique: single-shot Needle Needle type: Pencan  Needle gauge: 24 G Needle length: 9 cm Assessment Events: CSF return Additional Notes Risks, benefits, and alternative discussed. Patient gave consent to procedure. Prepped and draped in sitting position. Patient sedated but responsive to voice. Clear CSF obtained after one needle redirection. Positive terminal aspiration. No pain or paraesthesias with injection. Patient tolerated procedure well. Vital signs stable. Amalia Greenhouse, MD

## 2022-11-17 NOTE — Brief Op Note (Signed)
11/17/2022  2:56 PM  PATIENT:  [name]  72 y.o. female  PRE-OPERATIVE DIAGNOSIS:  Right knee osteoarthritis, end stage  POST-OPERATIVE DIAGNOSIS:  Right knee osteoarthritis, end stage  PROCEDURE:  Procedure(s): TOTAL KNEE ARTHROPLASTY (Right) DePuy Attune3  SURGEON:  Surgeons and Role:    Beverely Low, MD - Primary  PHYSICIAN ASSISTANT:   ASSISTANTS: Thea Gist, PA-C   ANESTHESIA:   regional and spinal  EBL:  minimal  BLOOD ADMINISTERED:none  DRAINS: none   LOCAL MEDICATIONS USED:  MARCAINE     SPECIMEN:  No Specimen  DISPOSITION OF SPECIMEN:  N/A  COUNTS:  YES  TOURNIQUET:  * Missing tourniquet times found for documented tourniquets in log: [id number] *  DICTATION: .Other Dictation: Dictation Number [id number]  PLAN OF CARE: Admit for overnight observation  PATIENT DISPOSITION:  PACU - hemodynamically stable.   Delay start of Pharmacological VTE agent (>24hrs) due to surgical blood loss or risk of bleeding: no

## 2022-11-17 NOTE — Care Plan (Signed)
Ortho Bundle Case Management Note  Patient Details  Name: [name] MRN: [id number] Date of Birth: [birth year]                  R TKA on 11/17/22.  DCP: Home with husband.  DME: RW ordered through [employer].  PT: EO 9/9   DME Arranged:  Walker rolling DME Agency:  [employer]    Additional Comments: Please contact me with any questions of if this plan should need to change.    Despina Pole, Case Manager  [facility]  [phone] 11/17/2022, 10:39 AM

## 2022-11-17 NOTE — Anesthesia Procedure Notes (Signed)
Anesthesia Regional Block: Adductor canal block   Pre-Anesthetic Checklist: , timeout performed,  Correct Patient, Correct Site, Correct Laterality,  Correct Procedure, Correct Position, site marked,  Risks and benefits discussed,  Pre-op evaluation,  At surgeon's request and post-op pain management  Laterality: Right  Prep: Maximum Sterile Barrier Precautions used, chloraprep       Needles:  Injection technique: Single-shot  Needle Type: Echogenic Stimulator Needle     Needle Length: 9cm  Needle Gauge: 22     Additional Needles:   Procedures:,,,, ultrasound used (permanent image in chart),,    Narrative:  Start time: 11/17/2022 12:29 PM End time: 11/17/2022 12:32 PM Injection made incrementally with aspirations every 5 mL.  Performed by: Personally  Anesthesiologist: Kaylyn Layer, MD  Additional Notes: Risks, benefits, and alternative discussed. Patient gave consent for procedure. Patient prepped and draped in sterile fashion. Sedation administered, patient remains easily responsive to voice. Relevant anatomy identified with ultrasound guidance. Local anesthetic given in 5cc increments with no signs or symptoms of intravascular injection. No pain or paraesthesias with injection. Patient monitored throughout procedure with signs of LAST or immediate complications. Tolerated well. Ultrasound image placed in chart.  Danielle Greenhouse, MD

## 2022-11-17 NOTE — Interval H&P Note (Signed)
History and Physical Interval Note:  11/17/2022 11:23 AM  [name]  has presented today for surgery, with the diagnosis of Right knee osteoarthritis.  The various methods of treatment have been discussed with the patient and family. After consideration of risks, benefits and other options for treatment, the patient has consented to  Procedure(s): TOTAL KNEE ARTHROPLASTY (Right) as a surgical intervention.  The patient's history has been reviewed, patient examined, no change in status, stable for surgery.  I have reviewed the patient's chart and labs.  Questions were answered to the patient's satisfaction.     Verlee Rossetti

## 2022-11-17 NOTE — Discharge Instructions (Signed)
Ice to the knee constantly.  Keep the incision covered and clean and dry for one week, then ok to get it wet in the shower.  Do exercise as instructed every hour, please to prevent stiffness.    DO NOT prop anything under the knee, it will make your knee stiff.  Prop under the ankle to encourage your knee to go straight.   Use the walker while you are up and around for balance.  Wear your support stockings 24/7 to prevent blood clots and take baby aspirin twice daily for 30 days also to prevent blood clots  Follow up with Dr Ranell Patrick in two weeks in the office, call [phone] for appt  PLEASE call Dr Ranell Patrick (cell) [phone] with any questions or concerns  INSTRUCTIONS AFTER JOINT REPLACEMENT   Remove items at home which could result in a fall. This includes throw rugs or furniture in walking pathways ICE to the affected joint every three hours while awake for 30 minutes at a time, for at least the first 3-5 days, and then as needed for pain and swelling.  Continue to use ice for pain and swelling. You may notice swelling that will progress down to the foot and ankle.  This is normal after surgery.  Elevate your leg when you are not up walking on it.   Continue to use the breathing machine you got in the hospital (incentive spirometer) which will help keep your temperature down.  It is common for your temperature to cycle up and down following surgery, especially at night when you are not up moving around and exerting yourself.  The breathing machine keeps your lungs expanded and your temperature down.   DIET:  As you were doing prior to hospitalization, we recommend a well-balanced diet.  DRESSING / WOUND CARE / SHOWERING  You may change your dressing 3-5 days after surgery.  Then change the dressing every day with sterile gauze.  Please use good hand washing techniques before changing the dressing.  Do not use any lotions or creams on the incision until instructed by your  surgeon.  ACTIVITY  Increase activity slowly as tolerated, but follow the weight bearing instructions below.   No driving for 6 weeks or until further direction given by your physician.  You cannot drive while taking narcotics.  No lifting or carrying greater than 10 lbs. until further directed by your surgeon. Avoid periods of inactivity such as sitting longer than an hour when not asleep. This helps prevent blood clots.  You may return to work once you are authorized by your doctor.     WEIGHT BEARING   Weight bearing as tolerated with assist device (walker, cane, etc) as directed, use it as long as suggested by your surgeon or therapist, typically at least 4-6 weeks.   EXERCISES  Results after joint replacement surgery are often greatly improved when you follow the exercise, range of motion and muscle strengthening exercises prescribed by your doctor. Safety measures are also important to protect the joint from further injury. Any time any of these exercises cause you to have increased pain or swelling, decrease what you are doing until you are comfortable again and then slowly increase them. If you have problems or questions, call your caregiver or physical therapist for advice.   Rehabilitation is important following a joint replacement. After just a few days of immobilization, the muscles of the leg can become weakened and shrink (atrophy).  These exercises are designed to build up  the tone and strength of the thigh and leg muscles and to improve motion. Often times heat used for twenty to thirty minutes before working out will loosen up your tissues and help with improving the range of motion but do not use heat for the first two weeks following surgery (sometimes heat can increase post-operative swelling).   These exercises can be done on a training (exercise) mat, on the floor, on a table or on a bed. Use whatever works the best and is most comfortable for you.    Use music or  television while you are exercising so that the exercises are a pleasant break in your day. This will make your life better with the exercises acting as a break in your routine that you can look forward to.   Perform all exercises about fifteen times, three times per day or as directed.  You should exercise both the operative leg and the other leg as well.  Exercises include:   Quad Sets - Tighten up the muscle on the front of the thigh (Quad) and hold for 5-10 seconds.   Straight Leg Raises - With your knee straight (if you were given a brace, keep it on), lift the leg to 60 degrees, hold for 3 seconds, and slowly lower the leg.  Perform this exercise against resistance later as your leg gets stronger.  Leg Slides: Lying on your back, slowly slide your foot toward your buttocks, bending your knee up off the floor (only go as far as is comfortable). Then slowly slide your foot back down until your leg is flat on the floor again.  Angel Wings: Lying on your back spread your legs to the side as far apart as you can without causing discomfort.  Hamstring Strength:  Lying on your back, push your heel against the floor with your leg straight by tightening up the muscles of your buttocks.  Repeat, but this time bend your knee to a comfortable angle, and push your heel against the floor.  You may put a pillow under the heel to make it more comfortable if necessary.   A rehabilitation program following joint replacement surgery can speed recovery and prevent re-injury in the future due to weakened muscles. Contact your doctor or a physical therapist for more information on knee rehabilitation.    CONSTIPATION  Constipation is defined medically as fewer than three stools per week and severe constipation as less than one stool per week.  Even if you have a regular bowel pattern at home, your normal regimen is likely to be disrupted due to multiple reasons following surgery.  Combination of anesthesia,  postoperative narcotics, change in appetite and fluid intake all can affect your bowels.   YOU MUST use at least one of the following options; they are listed in order of increasing strength to get the job done.  They are all available over the counter, and you may need to use some, POSSIBLY even all of these options:    Drink plenty of fluids (prune juice may be helpful) and high fiber foods Colace 100 mg by mouth twice a day  Senokot for constipation as directed and as needed Dulcolax (bisacodyl), take with full glass of water  Miralax (polyethylene glycol) once or twice a day as needed.  If you have tried all these things and are unable to have a bowel movement in the first 3-4 days after surgery call either your surgeon or your primary doctor.    If you experience  loose stools or diarrhea, hold the medications until you stool forms back up.  If your symptoms do not get better within 1 week or if they get worse, check with your doctor.  If you experience "the worst abdominal pain ever" or develop nausea or vomiting, please contact the office immediately for further recommendations for treatment.   ITCHING:  If you experience itching with your medications, try taking only a single pain pill, or even half a pain pill at a time.  You can also use Benadryl over the counter for itching or also to help with sleep.   TED HOSE STOCKINGS:  Use stockings on both legs until for at least 2 weeks or as directed by physician office. They may be removed at night for sleeping.  MEDICATIONS:  See your medication summary on the "After Visit Summary" that nursing will review with you.  You may have some home medications which will be placed on hold until you complete the course of blood thinner medication.  It is important for you to complete the blood thinner medication as prescribed.  PRECAUTIONS:  If you experience chest pain or shortness of breath - call 911 immediately for transfer to the hospital emergency  department.   If you develop a fever greater that 101 F, purulent drainage from wound, increased redness or drainage from wound, foul odor from the wound/dressing, or calf pain - CONTACT YOUR SURGEON.                                                   FOLLOW-UP APPOINTMENTS:  If you do not already have a post-op appointment, please call the office for an appointment to be seen by your surgeon.  Guidelines for how soon to be seen are listed in your "After Visit Summary", but are typically between 1-4 weeks after surgery.  OTHER INSTRUCTIONS:   Knee Replacement:  Do not place pillow under knee, focus on keeping the knee straight while resting. CPM instructions: 0-90 degrees, 2 hours in the morning, 2 hours in the afternoon, and 2 hours in the evening. Place foam block, curve side up under heel at all times except when in CPM or when walking.  DO NOT modify, tear, cut, or change the foam block in any way.  POST-OPERATIVE OPIOID TAPER INSTRUCTIONS: It is important to wean off of your opioid medication as soon as possible. If you do not need pain medication after your surgery it is ok to stop day one. Opioids include: Codeine, Hydrocodone(Norco, Vicodin), Oxycodone(Percocet, oxycontin) and hydromorphone amongst others.  Long term and even short term use of opiods can cause: Increased pain response Dependence Constipation Depression Respiratory depression And more.  Withdrawal symptoms can include Flu like symptoms Nausea, vomiting And more Techniques to manage these symptoms Hydrate well Eat regular healthy meals Stay active Use relaxation techniques(deep breathing, meditating, yoga) Do Not substitute Alcohol to help with tapering If you have been on opioids for less than two weeks and do not have pain than it is ok to stop all together.  Plan to wean off of opioids This plan should start within one week post op of your joint replacement. Maintain the same interval or time between taking  each dose and first decrease the dose.  Cut the total daily intake of opioids by one tablet each day Next start to increase  the time between doses. The last dose that should be eliminated is the evening dose.   MAKE SURE YOU:  Understand these instructions.  Get help right away if you are not doing well or get worse.    Thank you for letting us be a part of your medical care team.  It is a privilege we respect greatly.  We hope these instructions will help you stay on track for a fast and full recovery!

## 2022-11-17 NOTE — Progress Notes (Signed)
Orthopedic Tech Progress Note Patient Details:  [name] [facility] 1950-12-18 [id number] CPM will be removed by 11:00pm by nursing staff.  CPM Right Knee CPM Right Knee: On Right Knee Flexion (Degrees): 70 Right Knee Extension (Degrees): 0  Post Interventions Patient Tolerated: Well Ortho Devices Type of Ortho Device: Bone foam zero knee Ortho Device/Splint Location: Right knee Ortho Device/Splint Interventions: Application   Post Interventions Patient Tolerated: Well  [name] Deniel Mcquiston 11/17/2022, 7:20 PM

## 2022-11-17 NOTE — Transfer of Care (Signed)
Immediate Anesthesia Transfer of Care Note  Patient: [name] [facility]  Procedure(s) Performed: TOTAL KNEE ARTHROPLASTY (Right: Knee)  Patient Location: PACU  Anesthesia Type:Spinal  Level of Consciousness: awake, alert , oriented, and patient cooperative  Airway & Oxygen Therapy: Patient Spontanous Breathing and Patient connected to face mask oxygen  Post-op Assessment: Report given to RN and Post -op Vital signs reviewed and stable  Post vital signs: Reviewed and stable  Last Vitals:  Vitals Value Taken Time  BP    Temp    Pulse    Resp    SpO2      Last Pain:  Vitals:   11/17/22 1235  TempSrc:   PainSc: 0-No pain         Complications: No notable events documented.

## 2022-11-17 NOTE — Op Note (Unsigned)
[name], [name] MEDICAL RECORD NO: [id number] ACCOUNT NO: [id number] DATE OF BIRTH: 07/14/1950 FACILITY: [facility] LOCATION: [id number] PHYSICIAN: Almedia Balls. Ranell Patrick, MD  Operative Report   DATE OF PROCEDURE: 11/17/2022  PREOPERATIVE DIAGNOSIS:  Right knee end-stage arthritis.  POSTOPERATIVE DIAGNOSIS:  Right knee end-stage arthritis.  PROCEDURE PERFORMED:  Right total knee arthroplasty using DePuy Attune prosthesis.  ATTENDING SURGEON:  Almedia Balls. Ranell Patrick, MD  ASSISTANT:  Konrad Felix Dixon, [place], who was scrubbed during the entire procedure, and necessary for satisfactory completion of surgery.  ANESTHESIA:  Spinal anesthesia plus adductor canal block was utilized.  ESTIMATED BLOOD LOSS:  Minimal.  FLUID REPLACEMENT:  1500 mL crystalloid.  COUNTS:  Instrument counts correct.  COMPLICATIONS:  There were no complications.  ANTIBIOTICS:  Perioperative antibiotics were given.  TOURNIQUET TIME:  1 hour 20 minutes at 300 mmHg.  INDICATIONS:  The patient is a 72 year old female with worsening right knee pain secondary to end-stage arthritis, bone-on-bone.  The patient has failed conservative management and desires operative total knee arthroplasty in order to eliminate pain and  restore function.  Informed consent obtained.  DESCRIPTION OF PROCEDURE:  After an adequate level of anesthesia was achieved, the patient was positioned supine on the operating room table.  Nonsterile tourniquet placed on right proximal thigh.  Right leg sterilely prepped and draped in the usual  manner.  Timeout called, verifying correct patient, correct site.  We elevated the leg and exsanguinated with an Esmarch bandage, inflating the tourniquet to 300 mmHg, placed the knee in flexion and performed a longitudinal midline incision with a 10  blade scalpel, a fresh 10 blade was utilized for the medial parapatellar arthrotomy.  We divided lateral patellofemoral ligaments everted the patella.  We exposed the distal  femur, which was devoid of cartilage.  We entered the distal femur with a step  cut drill.  We then placed our intramedullary guide and resected 9 mm off the distal femur, set on 5 degrees of valgus.  We then sized our femur to a size 6 anterior down performing anterior, posterior and chamfer cuts with the 4-in-1 block.  Next, we  removed ACL and PCL meniscal tissue subluxing the tibia anteriorly.  We performed our tibial cut 90 degrees perpendicular long axis of tibia with minimal posterior slope for this posterior cruciate substituting prosthesis.  Initially, we resected 2 mm  off the affected medial side.  After our cuts and checking gaps we went back and cut 2 more mm off the tibia.  That gave [place] appropriate gaps both in flexion and extension.  We used a lamina spreader, removed posterior femoral condyle osteophytes with the  osteotome.  We then injected the posterior capsule with a combination of Marcaine, Exparel and saline.  Next, after checking our gaps, which were symmetric at 5 mm.  We did our tibial preparation for the size 5 tibia.  We used the modular drill and keel  punch to complete our tibial preparation and placed our trial.  We then went to the femur and did our box cut for the 6 narrow right femur.  Once we impacted the femur in place, we drilled our lug holes.  We then placed a 5 mm trial and reduced the  knee, which was stable both in flexion and extension.  We then resurfaced the patella going from a 24 mm thickness down to 14 mm thickness with the oscillating saw.  We drilled our lug holes for the 35 patellar button.  We then ranged  the knee, had  slight lateral tracking so we released the lateral retinaculum with a lateral release and we had perfect patellar tracking.  After that, we irrigated thoroughly, we removed all trial components and then vacuum mixed high viscosity cement on the back  table. We cemented the components into place all in one step including tibia, femur and  patella.  We placed the knee in extension with a 5 mm poly trial to compress the bone cement well and then we also used a patellar clamp to compress the patella while  the cement set up.  Once the cement was fully hardened, we removed excess cement with quarter-inch curved osteotome.  We felt like we could go from the 5 mm to 7 mm based on our flexion and extension.  We removed the trial component.  We placed the real  7 mm poly.  After doing a thorough inspection for cement and then reduced the knee and it was a nice little pop as that medial condyle reduced, we were able to get full extension with excellent flexion stability and excellent patellar tracking.  We  irrigated well.  We injected the anterior capsule with combination of Marcaine, Exparel and saline and then closed the parapatellar arthrotomy with #1 Vicryl suture, followed by 2-0 Vicryl for subcutaneous closure and 4-0 Monocryl for skin.  Steri-Strips  applied followed by sterile dressing.  The patient tolerated surgery well.   PUS D: 11/17/2022 3:08:45 pm T: 11/17/2022 6:21:00 pm  JOB: [id number]/ [id number]

## 2022-11-18 NOTE — Evaluation (Signed)
Physical Therapy Evaluation Patient Details Name: [name] MRN: [id number] DOB: 08-Dec-1950 Today's Date: 11/18/2022  History of Present Illness  Pt s/p R TKR  Clinical Impression  Pt s/p R TKR and presents with decreased R LE strength/ROM and post op pain limiting functional mobility.  Pt should progress well to dc home with family assist and plans follow up OPPT starting 11/20/22.        If plan is discharge home, recommend the following: A little help with walking and/or transfers;A little help with bathing/dressing/bathroom;Assist for transportation;Help with stairs or ramp for entrance   Can travel by private vehicle        Equipment Recommendations Rolling walker (2 wheels) (has been delivered to room)  Recommendations for Other Services       Functional Status Assessment Patient has had a recent decline in their functional status and demonstrates the ability to make significant improvements in function in a reasonable and predictable amount of time.     Precautions / Restrictions Precautions Precautions: Knee;Fall Required Braces or Orthoses: Knee Immobilizer - Right Knee Immobilizer - Right: Discontinue once straight leg raise with < 10 degree lag Restrictions Weight Bearing Restrictions: No Other Position/Activity Restrictions: WBAT      Mobility  Bed Mobility               General bed mobility comments: deferred to after Bfast    Transfers                        Ambulation/Gait                  Stairs            Wheelchair Mobility     Tilt Bed    Modified Rankin (Stroke Patients Only)       Balance                                             Pertinent Vitals/Pain Pain Assessment Pain Assessment: 0-10 Pain Score: 6  Pain Location: R knee Pain Descriptors / Indicators: Aching, Sore Pain Intervention(s): Limited activity within patient's tolerance, Monitored during session, Premedicated  before session, Ice applied    Home Living Family/patient expects to be discharged to:: Private residence Living Arrangements: Spouse/significant other Available Help at Discharge: Available 24 hours/day Type of Home: House Home Access: Stairs to enter Entrance Stairs-Rails: Right Entrance Stairs-Number of Steps: 3+1   Home Layout: One level Home Equipment: Cane - single point      Prior Function Prior Level of Function : Independent/Modified Independent                     Extremity/Trunk Assessment   Upper Extremity Assessment Upper Extremity Assessment: Overall WFL for tasks assessed    Lower Extremity Assessment Lower Extremity Assessment: RLE deficits/detail RLE Deficits / Details: 2+/5 quads with AAROm at knee -5 - 40    Cervical / Trunk Assessment Cervical / Trunk Assessment: Normal  Communication   Communication Communication: No apparent difficulties  Cognition Arousal: Alert Behavior During Therapy: WFL for tasks assessed/performed Overall Cognitive Status: Within Functional Limits for tasks assessed  General Comments      Exercises Total Joint Exercises Ankle Circles/Pumps: AROM, Both, 15 reps, Supine Quad Sets: AROM, Both, 10 reps, Supine Heel Slides: AAROM, Right, 15 reps, Supine Straight Leg Raises: AAROM, Right, 10 reps, Supine   Assessment/Plan    PT Assessment Patient needs continued PT services  PT Problem List Decreased strength;Decreased range of motion;Decreased activity tolerance;Decreased balance;Decreased mobility;Decreased knowledge of use of DME;Pain       PT Treatment Interventions DME instruction;Gait training;Stair training;Functional mobility training;Therapeutic activities;Therapeutic exercise;Patient/family education    PT Goals (Current goals can be found in the Care Plan section)  Acute Rehab PT Goals Patient Stated Goal: Regain IND PT Goal Formulation: With  patient Time For Goal Achievement: 11/25/22 Potential to Achieve Goals: Good    Frequency 7X/week     Co-evaluation               AM-PAC PT "6 Clicks" Mobility  Outcome Measure Help needed turning from your back to your side while in a flat bed without using bedrails?: A Little Help needed moving from lying on your back to sitting on the side of a flat bed without using bedrails?: A Little Help needed moving to and from a bed to a chair (including a wheelchair)?: A Little Help needed standing up from a chair using your arms (e.g., wheelchair or bedside chair)?: A Little Help needed to walk in hospital room?: A Little Help needed climbing 3-5 steps with a railing? : A Lot 6 Click Score: 17    End of Session Equipment Utilized During Treatment: Gait belt Activity Tolerance: Patient tolerated treatment well Patient left: in bed;with call bell/phone within reach;with bed alarm set;with family/visitor present Nurse Communication: Mobility status PT Visit Diagnosis: Difficulty in walking, not elsewhere classified (R26.2)    Time: 4098-1191 PT Time Calculation (min) (ACUTE ONLY): 20 min   Charges:   PT Evaluation $PT Eval Low Complexity: 1 Low   PT General Charges $$ ACUTE PT VISIT: 1 Visit         Mauro Kaufmann PT Acute Rehabilitation Services Pager [phone] Office [phone]   [name] 11/18/2022, 11:16 AM

## 2022-11-18 NOTE — Progress Notes (Signed)
Physical Therapy Treatment Patient Details Name: [name] MRN: [id number] DOB: 06-03-1950 Today's Date: 11/18/2022   History of Present Illness Pt s/p R TKR    PT Comments  Pt very cooperative and requiring min assist OOB and to ambulate limited distance in hall.  KI reviewed.  Pt reports pain is manageable.  Spouse present for session.    If plan is discharge home, recommend the following: A little help with walking and/or transfers;A little help with bathing/dressing/bathroom;Assist for transportation;Help with stairs or ramp for entrance   Can travel by private vehicle        Equipment Recommendations  Rolling walker (2 wheels)    Recommendations for Other Services       Precautions / Restrictions Precautions Precautions: Knee;Fall Required Braces or Orthoses: Knee Immobilizer - Right Knee Immobilizer - Right: Discontinue once straight leg raise with < 10 degree lag Restrictions Weight Bearing Restrictions: No Other Position/Activity Restrictions: WBAT     Mobility  Bed Mobility Overal bed mobility: Needs Assistance Bed Mobility: Supine to Sit     Supine to sit: Min assist     General bed mobility comments: cues for sequence and use of L LE to self assist.  physical assist to manage R LE    Transfers Overall transfer level: Needs assistance Equipment used: Rolling walker (2 wheels) Transfers: Sit to/from Stand Sit to Stand: Min assist           General transfer comment: cues for LE management and use of UEs to self assist    Ambulation/Gait Ambulation/Gait assistance: Min assist Gait Distance (Feet): 54 Feet Assistive device: Rolling walker (2 wheels) Gait Pattern/deviations: Step-to pattern, Decreased step length - right, Decreased step length - left, Shuffle, Trunk flexed Gait velocity: decr     General Gait Details: cues for sequence, posture and position from [employer]             Wheelchair Mobility     Tilt Bed     Modified Rankin (Stroke Patients Only)       Balance Overall balance assessment: Needs assistance Sitting-balance support: No upper extremity supported, Feet supported Sitting balance-Leahy Scale: Good     Standing balance support: Bilateral upper extremity supported Standing balance-Leahy Scale: Poor                              Cognition Arousal: Alert Behavior During Therapy: WFL for tasks assessed/performed Overall Cognitive Status: Within Functional Limits for tasks assessed                                          Exercises Total Joint Exercises Ankle Circles/Pumps: AROM, Both, 15 reps, Supine Quad Sets: AROM, Both, 10 reps, Supine Heel Slides: AAROM, Right, 15 reps, Supine Straight Leg Raises: AAROM, Right, 10 reps, Supine    General Comments        Pertinent Vitals/Pain Pain Assessment Pain Assessment: 0-10 Pain Score: 6  Pain Location: R knee Pain Descriptors / Indicators: Aching, Sore Pain Intervention(s): Limited activity within patient's tolerance, Monitored during session, Premedicated before session, Ice applied    Home Living Family/patient expects to be discharged to:: Private residence Living Arrangements: Spouse/significant other Available Help at Discharge: Available 24 hours/day Type of Home: House Home Access: Stairs to enter Entrance Stairs-Rails: Right Entrance Stairs-Number of Steps: 3+1  Home Layout: One level Home Equipment: Cane - single point      Prior Function            PT Goals (current goals can now be found in the care plan section) Acute Rehab PT Goals Patient Stated Goal: Regain IND PT Goal Formulation: With patient Time For Goal Achievement: 11/25/22 Potential to Achieve Goals: Good Progress towards PT goals: Progressing toward goals    Frequency    7X/week      PT Plan      Co-evaluation              AM-PAC PT "6 Clicks" Mobility   Outcome Measure  Help needed  turning from your back to your side while in a flat bed without using bedrails?: A Little Help needed moving from lying on your back to sitting on the side of a flat bed without using bedrails?: A Little Help needed moving to and from a bed to a chair (including a wheelchair)?: A Little Help needed standing up from a chair using your arms (e.g., wheelchair or bedside chair)?: A Little Help needed to walk in hospital room?: A Little Help needed climbing 3-5 steps with a railing? : A Lot 6 Click Score: 17    End of Session Equipment Utilized During Treatment: Gait belt;Right knee immobilizer Activity Tolerance: Patient tolerated treatment well Patient left: in chair;with call bell/phone within reach;with chair alarm set;with family/visitor present Nurse Communication: Mobility status PT Visit Diagnosis: Difficulty in walking, not elsewhere classified (R26.2)     Time: 8469-6295 PT Time Calculation (min) (ACUTE ONLY): 20 min  Charges:    $Gait Training: 8-22 mins PT General Charges $$ ACUTE PT VISIT: 1 Visit                     [name] PT Acute Rehabilitation Services Pager [phone] Office [phone]    [name] 11/18/2022, 11:23 AM

## 2022-11-18 NOTE — Care Management Important Message (Signed)
Important Message  Patient Details  Name: [name] MRN: [id number] Date of Birth: [birth year]   Medicare Important Message Given:  Yes     Georgie Chard, LCSW 11/18/2022, 2:46 PM

## 2022-11-18 NOTE — Progress Notes (Signed)
Physical Therapy Treatment Patient Details Name: [name] MRN: [id number] DOB: 1950-07-19 Today's Date: 11/18/2022   History of Present Illness Pt s/p R TKR    PT Comments  Pt continues very cooperative and progressing with mobility but struggling with sit,>stand and negotiating stairs. Pt would benefit from additional acute stay day to further address deficits and insure safe transition to home.   If plan is discharge home, recommend the following: A little help with walking and/or transfers;A little help with bathing/dressing/bathroom;Assist for transportation;Help with stairs or ramp for entrance   Can travel by private vehicle        Equipment Recommendations  Rolling walker (2 wheels)    Recommendations for Other Services       Precautions / Restrictions Precautions Precautions: Knee;Fall Required Braces or Orthoses: Knee Immobilizer - Right Knee Immobilizer - Right: Discontinue once straight leg raise with < 10 degree lag Restrictions Weight Bearing Restrictions: No Other Position/Activity Restrictions: WBAT     Mobility  Bed Mobility Overal bed mobility: Needs Assistance Bed Mobility: Sit to Supine     Supine to sit: Min assist Sit to supine: Min assist   General bed mobility comments: cues for sequence and use of L LE to self assist.  physical assist to manage R LE    Transfers Overall transfer level: Needs assistance Equipment used: Rolling walker (2 wheels) Transfers: Sit to/from Stand, Bed to chair/wheelchair/BSC Sit to Stand: Min assist, Mod assist   Step pivot transfers: Min assist, Mod assist       General transfer comment: cues for LE management and use of UEs to self assist; step pvt recliner to [facility]    Ambulation/Gait Ambulation/Gait assistance: Min assist Gait Distance (Feet): 64 Feet Assistive device: Rolling walker (2 wheels) Gait Pattern/deviations: Step-to pattern, Decreased step length - right, Decreased step length - left,  Shuffle, Trunk flexed Gait velocity: decr     General Gait Details: cues for sequence, posture and position from RW   Stairs Stairs: Yes Stairs assistance: Min assist, Mod assist Stair Management: One rail Left, Step to pattern, Forwards, With cane Number of Stairs: 2 General stair comments: cues for sequence and foot/cane placement; increased time and increased assist for support/balance   Wheelchair Mobility     Tilt Bed    Modified Rankin (Stroke Patients Only)       Balance Overall balance assessment: Needs assistance Sitting-balance support: No upper extremity supported, Feet supported Sitting balance-Leahy Scale: Good     Standing balance support: Bilateral upper extremity supported Standing balance-Leahy Scale: Poor                              Cognition Arousal: Alert Behavior During Therapy: WFL for tasks assessed/performed Overall Cognitive Status: Within Functional Limits for tasks assessed                                          Exercises Total Joint Exercises Ankle Circles/Pumps: AROM, Both, 15 reps, Supine Quad Sets: AROM, Both, 10 reps, Supine Heel Slides: AAROM, Right, 15 reps, Supine Straight Leg Raises: AAROM, Right, 10 reps, Supine    General Comments        Pertinent Vitals/Pain Pain Assessment Pain Assessment: No/denies pain Pain Score: 7  Pain Location: R knee Pain Descriptors / Indicators: Aching, Sore Pain Intervention(s): Limited activity within patient's  tolerance, Premedicated before session, Monitored during session, Ice applied    Home Living                          Prior Function            PT Goals (current goals can now be found in the care plan section) Acute Rehab PT Goals Patient Stated Goal: Regain IND PT Goal Formulation: With patient Time For Goal Achievement: 11/25/22 Potential to Achieve Goals: Good Progress towards PT goals: Progressing toward goals     Frequency    7X/week      PT Plan      Co-evaluation              AM-PAC PT "6 Clicks" Mobility   Outcome Measure  Help needed turning from your back to your side while in a flat bed without using bedrails?: A Little Help needed moving from lying on your back to sitting on the side of a flat bed without using bedrails?: A Little Help needed moving to and from a bed to a chair (including a wheelchair)?: A Little Help needed standing up from a chair using your arms (e.g., wheelchair or bedside chair)?: A Little Help needed to walk in hospital room?: A Little Help needed climbing 3-5 steps with a railing? : A Lot 6 Click Score: 17    End of Session Equipment Utilized During Treatment: Gait belt;Right knee immobilizer Activity Tolerance: Patient tolerated treatment well Patient left: with call bell/phone within reach;in bed;with bed alarm set Nurse Communication: Mobility status PT Visit Diagnosis: Difficulty in walking, not elsewhere classified (R26.2)     Time: 4098-1191 PT Time Calculation (min) (ACUTE ONLY): 42 min  Charges:    $Gait Training: 8-22 mins $Therapeutic Activity: 23-37 mins PT General Charges $$ ACUTE PT VISIT: 1 Visit                     Mauro Kaufmann PT Acute Rehabilitation Services Pager [phone] Office [phone]    [name] 11/18/2022, 3:17 PM

## 2022-11-18 NOTE — Progress Notes (Signed)
Subjective: 1 Day Post-Op Procedure(s) (LRB): TOTAL KNEE ARTHROPLASTY (Right)  Patient reports pain as mild to moderate.  Denies fever, chill,s N/V, CP, SOB. Tolerating POs well.  Admits to flatus.  Believes she's ready to go home today.  Objective:   VITALS:  Temp:  [97.5 F (36.4 C)-98.3 F (36.8 C)] 97.5 F (36.4 C) (09/07 0534) Pulse Rate:  [54-74] 64 (09/07 0534) Resp:  [10-20] 16 (09/07 0534) BP: (97-138)/(54-82) 101/54 (09/07 0534) SpO2:  [93 %-100 %] 94 % (09/07 0534) Weight:  [81.2 kg] 81.2 kg (09/06 1023)  General: WDWN patient in NAD. Psych:  Appropriate mood and affect. Neuro:  A&O x 3, Moving all extremities, sensation intact to light touch HEENT:  EOMs intact Chest:  Even non-labored respirations Skin:  Incision C/D/I, no rashes or lesions Extremities: warm/dry, mild edema to R knee, no erythema or echymosis.  No lymphadenopathy. Pulses: Popliteus 2+ MSK:  ROM: lacks 5 degrees TKE, MMT: able to perform quad set, (-) Homan's    LABS No results for input(s): "HGB", "WBC", "PLT" in the last 72 hours. No results for input(s): "NA", "K", "CL", "CO2", "BUN", "CREATININE", "GLUCOSE" in the last 72 hours. No results for input(s): "LABPT", "INR" in the last 72 hours.   Assessment/Plan: 1 Day Post-Op Procedure(s) (LRB): TOTAL KNEE ARTHROPLASTY (Right)  Patient seen in rounds for Dr. Ranell Patrick Aquacel dressing applied WBAT with walker Up with therapy DVT ppx: ASA D/C home upon PT clearance.  D/C order placed.  Please inform me if order needs to be canceled. Plan for outpatient post-op visit with Dr. Ranell Patrick.  Alfredo Martinez PA-C [facility] Office:  [phone]

## 2022-11-18 NOTE — Progress Notes (Signed)
Transition of Care [facility] [place] [facility]) - Inpatient Brief Assessment   Patient Details  Name: [name] MRN: [id number] Date of Birth: [birth year]  Transition of Care [facility]) CM/SW Contact:    Georgie Chard, LCSW Phone Number: 11/18/2022, 2:52 PM   Clinical Narrative: CSW met patient at bedside at this time CSW confirmed that the patient had a walker. Patient did receive walker. Patient stated she does PT at a facility. Patient reports no needs at this time from [facility].    Transition of Care Asessment: Insurance and Status: Insurance coverage has been reviewed Patient has primary care physician: Yes Home environment has been reviewed: Lives at home with husband Prior level of function:: walking Prior/Current Home Services: No current home services Social Determinants of Health Reivew: SDOH reviewed no interventions necessary Readmission risk has been reviewed: Yes Transition of care needs: no transition of care needs at this time

## 2022-11-18 NOTE — Plan of Care (Signed)
  Problem: Activity: Goal: Range of joint motion will improve Outcome: Progressing   Problem: Clinical Measurements: Goal: Postoperative complications will be avoided or minimized Outcome: Progressing   Problem: Health Behavior/Discharge Planning: Goal: Ability to manage health-related needs will improve Outcome: Progressing   Problem: Clinical Measurements: Goal: Ability to maintain clinical measurements within normal limits will improve Outcome: Progressing

## 2022-11-18 NOTE — Care Management Obs Status (Signed)
MEDICARE OBSERVATION STATUS NOTIFICATION   Patient Details  Name: [name] MRN: [id number] Date of Birth: January 27, 1951   Medicare Observation Status Notification Given:  Yes    Georgie Chard, LCSW 11/18/2022, 2:46 PM

## 2022-11-18 NOTE — Discharge Summary (Cosign Needed Addendum)
Physician Discharge Summary  Patient ID: [name] MRN: [id number] DOB/AGE: December 30, 1950 72 y.o.  Admit date: 11/17/2022 Discharge date: 11/19/2022  Admission Diagnoses: R knee OA; hx of HTN, hypothyroidism, hyperlipidemia, LBP, liver hemangioma, allergic rhinitis, GAD, genital herpes, impaired fasting glucose, insomnia, ptosis, cataract, vit B12 def, vit D def, depressive disorder.  Discharge Diagnoses:  Principal Problem:   Status post total knee replacement, right Same as above.  Discharged Condition: stable  Hospital Course: Patient presented to [facility] OR for elective R TKA by Dr. Ranell Patrick on 11/17/22.  She tolerated with procedure well without difficulty.  She was admitted to the hospital.  She worked well with therapy.  She tolerated her stay well without complication.  She is to be D/C'd home.  Consults:  PT  Significant Diagnostic Studies: N/A  Treatments: IV hydration, antibiotics: Ancef, analgesia: acetaminophen, Dilaudid, and oxycodone, cardiac meds: losartan, anticoagulation: ASA, and surgery: as stated above.  Discharge Exam: Blood pressure (!) 101/54, pulse 64, temperature (!) 97.5 F (36.4 C), resp. rate 16, height 5\' 7"  (1.702 m), weight 81.2 kg, SpO2 94%. General: WDWN patient in NAD. Psych:  Appropriate mood and affect. Neuro:  A&O x 3, Moving all extremities, sensation intact to light touch HEENT:  EOMs intact Chest:  Even non-labored respirations Skin:  Incision C/D/I, no rashes or lesions Extremities: warm/dry, mild edema to R knee, no erythema or echymosis.  No lymphadenopathy. Pulses: Popliteus 2+ MSK:  ROM: lacks 5 degrees TKE, MMT: able to perform quad set, (-) Homan's   Disposition: Discharge disposition: 01-Home or Self Care       Discharge Instructions     Call MD / Call 911   Complete by: As directed    If you experience chest pain or shortness of breath, CALL 911 and be transported to the hospital emergency room.  If you develope a fever above  101 F, pus (white drainage) or increased drainage or redness at the wound, or calf pain, call your surgeon's office.   Constipation Prevention   Complete by: As directed    Drink plenty of fluids.  Prune juice may be helpful.  You may use a stool softener, such as Colace (over the counter) 100 mg twice a day.  Use MiraLax (over the counter) for constipation as needed.   Diet - low sodium heart healthy   Complete by: As directed    Increase activity slowly as tolerated   Complete by: As directed    Post-operative opioid taper instructions:   Complete by: As directed    POST-OPERATIVE OPIOID TAPER INSTRUCTIONS: It is important to wean off of your opioid medication as soon as possible. If you do not need pain medication after your surgery it is ok to stop day one. Opioids include: Codeine, Hydrocodone(Norco, Vicodin), Oxycodone(Percocet, oxycontin) and hydromorphone amongst others.  Long term and even short term use of opiods can cause: Increased pain response Dependence Constipation Depression Respiratory depression And more.  Withdrawal symptoms can include Flu like symptoms Nausea, vomiting And more Techniques to manage these symptoms Hydrate well Eat regular healthy meals Stay active Use relaxation techniques(deep breathing, meditating, yoga) Do Not substitute Alcohol to help with tapering If you have been on opioids for less than two weeks and do not have pain than it is ok to stop all together.  Plan to wean off of opioids This plan should start within one week post op of your joint replacement. Maintain the same interval or time between taking each dose and first  decrease the dose.  Cut the total daily intake of opioids by one tablet each day Next start to increase the time between doses. The last dose that should be eliminated is the evening dose.         Allergies as of 11/18/2022   No Known Allergies      Medication List     TAKE these medications    aspirin  81 MG chewable tablet Commonly known as: Aspirin Childrens Chew 1 tablet (81 mg total) by mouth in the morning and at bedtime.   carboxymethylcellulose 0.5 % Soln Commonly known as: REFRESH PLUS Place 1 drop into both eyes 3 (three) times daily as needed (dry/irritated eyes.).   docusate sodium 100 MG capsule Commonly known as: COLACE Take 100 mg by mouth daily.   DULoxetine 60 MG capsule Commonly known as: CYMBALTA TAKE 1 CAPSULE EVERY DAY   fluticasone 50 MCG/ACT nasal spray Commonly known as: FLONASE Place 2 sprays into both nostrils daily. What changed:  when to take this reasons to take this   losartan-hydrochlorothiazide 50-12.5 MG tablet Commonly known as: HYZAAR TAKE 1 TABLET EVERY DAY   methocarbamol 500 MG tablet Commonly known as: ROBAXIN Take 1 tablet (500 mg total) by mouth every 8 (eight) hours as needed for muscle spasms.   multivitamin with minerals tablet Take 1 tablet by mouth in the morning.   neomycin-polymyxin-hydrocortisone OTIC solution Commonly known as: CORTISPORIN Place 3 drops into both ears 3 (three) times daily. What changed:  when to take this reasons to take this   ondansetron 4 MG tablet Commonly known as: Zofran Take 1 tablet (4 mg total) by mouth every 8 (eight) hours as needed for vomiting, refractory nausea / vomiting or nausea.   oxyCODONE-acetaminophen 5-325 MG tablet Commonly known as: Percocet Take 1 tablet by mouth every 4 (four) hours as needed for severe pain or moderate pain.   psyllium 58.6 % packet Commonly known as: METAMUCIL Take 1 packet by mouth every other day as needed (regularity).   simvastatin 20 MG tablet Commonly known as: ZOCOR Take 1 tablet (20 mg total) by mouth daily at 6 PM.   SUPER B COMPLEX PO Take 1 tablet by mouth in the morning.   Synthroid 88 MCG tablet Generic drug: levothyroxine Take 88 mcg by mouth daily before breakfast.   traZODone 50 MG tablet Commonly known as: DESYREL TAKE 1  TABLET (50 MG TOTAL) BY MOUTH AT BEDTIME. What changed: how much to take   VITAMIN C PO Take 1 tablet by mouth in the morning.   VITAMIN D-3 PO Take 1 tablet by mouth in the morning.        Follow-up Information     Beverely Low, MD. Go on 11/29/2022.   Specialty: Orthopedic Surgery Why: You are scheduled for first post op appt on Wednesday Sept 18 at 9:45am. Contact information: [address] [place] [address] [phone]         Zorita Pang.. Go on 11/20/2022.   Why: You are scheduled for physical therapy eval on Monday Sept 9 at 11:15am. Contact information: [address] [place] [address] [phone]         Beverely Low, MD. Call in 2 week(s).   Specialty: Orthopedic Surgery Why: call [phone] for appt in two weeks with Dr Phineas Inches information: [address] [place] [address] [phone]  Signed: Alfredo Martinez PA-C [facility] Office:  [phone]

## 2022-11-19 DIAGNOSIS — M1711 Unilateral primary osteoarthritis, right knee: Secondary | ICD-10-CM | POA: Diagnosis present

## 2022-11-19 DIAGNOSIS — Z79890 Hormone replacement therapy (postmenopausal): Secondary | ICD-10-CM | POA: Diagnosis not present

## 2022-11-19 DIAGNOSIS — I10 Essential (primary) hypertension: Secondary | ICD-10-CM | POA: Diagnosis present

## 2022-11-19 DIAGNOSIS — E1165 Type 2 diabetes mellitus with hyperglycemia: Secondary | ICD-10-CM | POA: Diagnosis present

## 2022-11-19 DIAGNOSIS — E785 Hyperlipidemia, unspecified: Secondary | ICD-10-CM | POA: Diagnosis present

## 2022-11-19 DIAGNOSIS — Z79899 Other long term (current) drug therapy: Secondary | ICD-10-CM | POA: Diagnosis not present

## 2022-11-19 DIAGNOSIS — E039 Hypothyroidism, unspecified: Secondary | ICD-10-CM | POA: Diagnosis present

## 2022-11-19 DIAGNOSIS — Z90710 Acquired absence of both cervix and uterus: Secondary | ICD-10-CM | POA: Diagnosis not present

## 2022-11-19 DIAGNOSIS — F418 Other specified anxiety disorders: Secondary | ICD-10-CM | POA: Diagnosis present

## 2022-11-19 MED ORDER — KETOROLAC TROMETHAMINE 15 MG/ML IJ SOLN
7.5000 mg | Freq: Four times a day (QID) | INTRAMUSCULAR | Status: AC
  Administered 2022-11-19 (×2): 7.5 mg via INTRAVENOUS
  Filled 2022-11-19 (×3): qty 1

## 2022-11-19 MED ORDER — SODIUM CHLORIDE 0.9 % IV BOLUS
250.0000 mL | Freq: Once | INTRAVENOUS | Status: AC
  Administered 2022-11-19: 250 mL via INTRAVENOUS

## 2022-11-19 NOTE — Plan of Care (Signed)
  Problem: Education: Goal: Knowledge of the prescribed therapeutic regimen will improve Outcome: Progressing   Problem: Activity: Goal: Range of joint motion will improve Outcome: Progressing   Problem: Clinical Measurements: Goal: Ability to maintain clinical measurements within normal limits will improve Outcome: Progressing   Problem: Safety: Goal: Ability to remain free from injury will improve Outcome: Progressing

## 2022-11-19 NOTE — Progress Notes (Signed)
Physical Therapy Treatment Patient Details Name: [name] MRN: [id number] DOB: 1950/05/16 Today's Date: 11/19/2022   History of Present Illness Pt s/p R TKR    PT Comments  Pt continues cooperative but requiring increased time and assist for all tasks this am.  Pt requesting OOB bc of back pain and assisted to EOB and to standing but tolerated ambulation very short distance only before c/o "feeling really hot".  Pt assisted to sitting and feet up with BP 89/60 - RN aware.    If plan is discharge home, recommend the following: A little help with walking and/or transfers;A little help with bathing/dressing/bathroom;Assist for transportation;Help with stairs or ramp for entrance   Can travel by private vehicle        Equipment Recommendations  Rolling walker (2 wheels)    Recommendations for Other Services       Precautions / Restrictions Precautions Precautions: Knee;Fall Required Braces or Orthoses: Knee Immobilizer - Right Knee Immobilizer - Right: Discontinue once straight leg raise with < 10 degree lag Restrictions Weight Bearing Restrictions: No Other Position/Activity Restrictions: WBAT     Mobility  Bed Mobility Overal bed mobility: Needs Assistance Bed Mobility: Supine to Sit     Supine to sit: Min assist     General bed mobility comments: Increased time with cues for sequence and use of L LE to self assist.  Use of bed rails and physical assist to manage R LE    Transfers Overall transfer level: Needs assistance Equipment used: Rolling walker (2 wheels) Transfers: Sit to/from Stand, Bed to chair/wheelchair/BSC Sit to Stand: Min assist, Mod assist   Step pivot transfers: Min assist, Mod assist       General transfer comment: cues for LE management and use of UEs to self assist; step pvt bed to recliner    Ambulation/Gait Ambulation/Gait assistance: Min assist Gait Distance (Feet): 4 Feet Assistive device: Rolling walker (2 wheels) Gait  Pattern/deviations: Step-to pattern, Decreased step length - right, Decreased step length - left, Shuffle, Trunk flexed Gait velocity: decr     General Gait Details: cues for sequence, posture and position from RW; distance ltd by c/o pain and "feeling hot"   Stairs             Wheelchair Mobility     Tilt Bed    Modified Rankin (Stroke Patients Only)       Balance Overall balance assessment: Needs assistance Sitting-balance support: No upper extremity supported, Feet supported Sitting balance-Leahy Scale: Good     Standing balance support: Bilateral upper extremity supported Standing balance-Leahy Scale: Poor                              Cognition Arousal: Alert Behavior During Therapy: WFL for tasks assessed/performed Overall Cognitive Status: Within Functional Limits for tasks assessed                                          Exercises      General Comments        Pertinent Vitals/Pain Pain Assessment Pain Assessment: 0-10 Pain Score: 10-Worst pain ever Pain Location: R knee Pain Descriptors / Indicators: Aching, Sore Pain Intervention(s): Limited activity within patient's tolerance, Monitored during session, Premedicated before session, Patient requesting pain meds-RN notified, Ice applied    Home Living  Prior Function            PT Goals (current goals can now be found in the care plan section) Acute Rehab PT Goals Patient Stated Goal: Regain IND PT Goal Formulation: With patient Time For Goal Achievement: 11/25/22 Potential to Achieve Goals: Good Progress towards PT goals: Not progressing toward goals - comment (orthostatic)    Frequency    7X/week      PT Plan      Co-evaluation              AM-PAC PT "6 Clicks" Mobility   Outcome Measure  Help needed turning from your back to your side while in a flat bed without using bedrails?: A Little Help needed  moving from lying on your back to sitting on the side of a flat bed without using bedrails?: A Lot Help needed moving to and from a bed to a chair (including a wheelchair)?: A Lot Help needed standing up from a chair using your arms (e.g., wheelchair or bedside chair)?: A Lot Help needed to walk in hospital room?: Total Help needed climbing 3-5 steps with a railing? : Total 6 Click Score: 11    End of Session Equipment Utilized During Treatment: Gait belt;Right knee immobilizer Activity Tolerance: Patient limited by fatigue;Patient limited by pain Patient left: in chair;with call bell/phone within reach;with chair alarm set;with nursing/sitter in room Nurse Communication: Mobility status PT Visit Diagnosis: Difficulty in walking, not elsewhere classified (R26.2)     Time: 4098-1191 PT Time Calculation (min) (ACUTE ONLY): 29 min  Charges:    $Therapeutic Activity: 23-37 mins PT General Charges $$ ACUTE PT VISIT: 1 Visit                     Mauro Kaufmann PT Acute Rehabilitation Services Pager [phone] Office [phone]    [name] 11/19/2022, 12:14 PM

## 2022-11-19 NOTE — Plan of Care (Signed)
  Problem: Education: Goal: Knowledge of the prescribed therapeutic regimen will improve Outcome: Progressing   Problem: Activity: Goal: Ability to avoid complications of mobility impairment will improve Outcome: Progressing   Problem: Clinical Measurements: Goal: Postoperative complications will be avoided or minimized Outcome: Progressing   Problem: Skin Integrity: Goal: Will show signs of wound healing Outcome: Progressing   Problem: Education: Goal: Knowledge of General Education information will improve Description: Including pain rating scale, medication(s)/side effects and non-pharmacologic comfort measures Outcome: Progressing

## 2022-11-19 NOTE — Progress Notes (Signed)
Physical Therapy Treatment Patient Details Name: [name] MRN: [id number] DOB: January 06, 1951 Today's Date: 11/19/2022   History of Present Illness Pt s/p R TKR    PT Comments  Pt cooperative and reports pain better controlled but very fatigued.  Pt has been waiting for bolus but IV team needed for placement of new IV.  Pt assisted up from recliner to ambulate limited distance and assisted to bed in prep for IV team arrival.  Pt performed therex program with assist.    If plan is discharge home, recommend the following: A little help with walking and/or transfers;A little help with bathing/dressing/bathroom;Assist for transportation;Help with stairs or ramp for entrance   Can travel by private vehicle        Equipment Recommendations  Rolling walker (2 wheels)    Recommendations for Other Services       Precautions / Restrictions Precautions Precautions: Knee;Fall Required Braces or Orthoses: Knee Immobilizer - Right Knee Immobilizer - Right: Discontinue once straight leg raise with < 10 degree lag Restrictions Weight Bearing Restrictions: No Other Position/Activity Restrictions: WBAT     Mobility  Bed Mobility Overal bed mobility: Needs Assistance Bed Mobility: Sit to Supine     Supine to sit: Min assist Sit to supine: Min assist   General bed mobility comments: Increased time with cues for sequence and use of L LE to self assist.  Use of bed rails and physical assist to manage R LE    Transfers Overall transfer level: Needs assistance Equipment used: Rolling walker (2 wheels) Transfers: Sit to/from Stand, Bed to chair/wheelchair/BSC Sit to Stand: Min assist, Mod assist   Step pivot transfers: Min assist, Mod assist       General transfer comment: cues for LE management and use of UEs to self assist; step pvt recliner to bedside    Ambulation/Gait Ambulation/Gait assistance: Min assist Gait Distance (Feet): 4 Feet Assistive device: Rolling walker (2  wheels) Gait Pattern/deviations: Step-to pattern, Decreased step length - right, Decreased step length - left, Shuffle, Trunk flexed Gait velocity: decr     General Gait Details: cues for sequence, posture and position from RW; distance ltd by c/o pain/fatigue   Stairs             Wheelchair Mobility     Tilt Bed    Modified Rankin (Stroke Patients Only)       Balance Overall balance assessment: Needs assistance Sitting-balance support: No upper extremity supported, Feet supported Sitting balance-Leahy Scale: Good     Standing balance support: Bilateral upper extremity supported Standing balance-Leahy Scale: Poor                              Cognition Arousal: Alert Behavior During Therapy: WFL for tasks assessed/performed Overall Cognitive Status: Within Functional Limits for tasks assessed                                          Exercises Total Joint Exercises Ankle Circles/Pumps: AROM, Both, 15 reps, Supine Quad Sets: AROM, Both, 10 reps, Supine Heel Slides: AAROM, Right, Supine, 20 reps Straight Leg Raises: AAROM, Right, Supine, 20 reps Goniometric ROM: AAROM R knee -5 - 40    General Comments        Pertinent Vitals/Pain Pain Assessment Pain Assessment: 0-10 Pain Score: 6  Pain Location: R knee  Pain Descriptors / Indicators: Aching, Sore Pain Intervention(s): Limited activity within patient's tolerance, Monitored during session, Premedicated before session, Ice applied    Home Living                          Prior Function            PT Goals (current goals can now be found in the care plan section) Acute Rehab PT Goals Patient Stated Goal: Regain IND PT Goal Formulation: With patient Time For Goal Achievement: 11/25/22 Potential to Achieve Goals: Good Progress towards PT goals: Progressing toward goals    Frequency    7X/week      PT Plan      Co-evaluation               AM-PAC PT "6 Clicks" Mobility   Outcome Measure  Help needed turning from your back to your side while in a flat bed without using bedrails?: A Little Help needed moving from lying on your back to sitting on the side of a flat bed without using bedrails?: A Little Help needed moving to and from a bed to a chair (including a wheelchair)?: A Lot Help needed standing up from a chair using your arms (e.g., wheelchair or bedside chair)?: A Lot Help needed to walk in hospital room?: Total Help needed climbing 3-5 steps with a railing? : Total 6 Click Score: 12    End of Session Equipment Utilized During Treatment: Gait belt;Right knee immobilizer Activity Tolerance: Patient tolerated treatment well;Patient limited by fatigue Patient left: in bed;with call bell/phone within reach;with bed alarm set;with nursing/sitter in room Nurse Communication: Mobility status PT Visit Diagnosis: Difficulty in walking, not elsewhere classified (R26.2)     Time: 1610-9604 PT Time Calculation (min) (ACUTE ONLY): 24 min  Charges:    $Therapeutic Exercise: 8-22 mins $Therapeutic Activity: 8-22 mins PT General Charges $$ ACUTE PT VISIT: 1 Visit                     Mauro Kaufmann PT Acute Rehabilitation Services Pager [phone] Office [phone]    Tavari Loadholt 11/19/2022, 4:27 PM

## 2022-11-19 NOTE — Plan of Care (Signed)
  Problem: Education: Goal: Knowledge of the prescribed therapeutic regimen will improve Outcome: Progressing   Problem: Activity: Goal: Range of joint motion will improve Outcome: Progressing   Problem: Pain Management: Goal: Pain level will decrease with appropriate interventions Outcome: Progressing   Problem: Safety: Goal: Ability to remain free from injury will improve Outcome: Progressing   

## 2022-11-19 NOTE — Progress Notes (Signed)
Patient ID: [name], female   DOB: [birth year], 72 y.o.   MRN: [id number] Subjective: 2 Days Post-Op Procedure(s) (LRB): TOTAL KNEE ARTHROPLASTY (Right)    Patient reports pain as moderate.  Pain limiting her activity level currently.  Lives at home with her husband.  Objective:   VITALS:   Vitals:   11/18/22 2215 11/19/22 0504  BP: (!) 121/57 (!) 112/59  Pulse: 83 77  Resp: 17 16  Temp: 98.4 F (36.9 C) 98.8 F (37.1 C)  SpO2: 94% 95%    Neurovascular intact Incision: dressing C/D/I - ACE wrap removed, scant drops of blood staining the Aquacell dressing  LABS No results for input(s): "HGB", "HCT", "WBC", "PLT" in the last 72 hours.  No results for input(s): "NA", "K", "BUN", "CREATININE", "GLUCOSE" in the last 72 hours.  No results for input(s): "LABPT", "INR" in the last 72 hours.   Assessment/Plan: 2 Days Post-Op Procedure(s) (LRB): TOTAL KNEE ARTHROPLASTY (Right)   Up with therapy Discharge not likely due to pain levels Will add a few doses of Toradol to try and help with the pain

## 2022-11-20 ENCOUNTER — Encounter (HOSPITAL_COMMUNITY): Payer: Self-pay | Admitting: Orthopedic Surgery

## 2022-11-20 NOTE — Anesthesia Postprocedure Evaluation (Signed)
Anesthesia Post Note  Patient: [name] [facility]  Procedure(s) Performed: TOTAL KNEE ARTHROPLASTY (Right: Knee)     Patient location during evaluation: PACU Anesthesia Type: Spinal Level of consciousness: awake and alert Pain management: pain level controlled Vital Signs Assessment: post-procedure vital signs reviewed and stable Respiratory status: spontaneous breathing, nonlabored ventilation and respiratory function stable Cardiovascular status: blood pressure returned to baseline Postop Assessment: no apparent nausea or vomiting, spinal receding, no headache and no backache Anesthetic complications: no   No notable events documented.        Shanda Howells

## 2022-11-20 NOTE — Discharge Summary (Signed)
In most cases prophylactic antibiotics for Dental procdeures after total joint surgery are not necessary.  Exceptions are as follows:  1. History of prior total joint infection  2. Severely immunocompromised (Organ Transplant, cancer chemotherapy, Rheumatoid biologic meds such as Humera)  3. Poorly controlled diabetes (A1C &gt; 8.0, blood glucose over 200)  If you have one of these conditions, contact your surgeon for an antibiotic prescription, prior to your dental procedure. Orthopedic Discharge Summary        Physician Discharge Summary  Patient ID: [name] MRN: [id number] DOB/AGE: 04-14-1950 72 y.o.  Admit date: 11/17/2022 Discharge date: 11/20/2022   Procedures:  Procedure(s) (LRB): TOTAL KNEE ARTHROPLASTY (Right)  Attending Physician:  Dr. Malon Kindle  Admission Diagnoses:   right knee end stage osteoarthritis  Discharge Diagnoses:  right knee end stage osteoarthritis   Past Medical History:  Diagnosis Date   Anxiety    Arthritis    Blood in stool    Colon polyps    Hyperlipidemia    Hypertension    Thyroid disease     PCP: Myrlene Broker, MD   Discharged Condition: good  Hospital Course:  Patient underwent the above stated procedure on 11/17/2022. Patient tolerated the procedure well and brought to the recovery room in good condition and subsequently to the floor. Patient had an uncomplicated hospital course and was stable for discharge.   Disposition: Discharge disposition: 01-Home or Self Care      with follow up in 2 weeks    Follow-up Information     Beverely Low, MD. Go on 11/29/2022.   Specialty: Orthopedic Surgery Why: You are scheduled for first post op appt on Wednesday Sept 18 at 9:45am. Contact information: [address] [place] [address] [phone]         Zorita Pang.. Go on 11/20/2022.   Why: You are scheduled for physical therapy eval on Monday Sept 9 at 11:15am. Contact  information: [address] [place] [address] [phone]         Beverely Low, MD. Call in 2 week(s).   Specialty: Orthopedic Surgery Why: call [phone] for appt in two weeks with Dr Phineas Inches information: [address] [place] [address] [phone]                 Dental Antibiotics:  In most cases prophylactic antibiotics for Dental procdeures after total joint surgery are not necessary.  Exceptions are as follows:  1. History of prior total joint infection  2. Severely immunocompromised (Organ Transplant, cancer chemotherapy, Rheumatoid biologic meds such as Humera)  3. Poorly controlled diabetes (A1C &gt; 8.0, blood glucose over 200)  If you have one of these conditions, contact your surgeon for an antibiotic prescription, prior to your dental procedure.  Discharge Instructions     Call MD / Call 911   Complete by: As directed    If you experience chest pain or shortness of breath, CALL 911 and be transported to the hospital emergency room.  If you develope a fever above 101 F, pus (white drainage) or increased drainage or redness at the wound, or calf pain, call your surgeon's office.   Call MD / Call 911   Complete by: As directed    If you experience chest pain or shortness of breath, CALL 911 and be transported to the hospital emergency room.  If you develope a fever above 101 F, pus (white drainage) or increased drainage  or redness at the wound, or calf pain, call your surgeon's office.   Constipation Prevention   Complete by: As directed    Drink plenty of fluids.  Prune juice may be helpful.  You may use a stool softener, such as Colace (over the counter) 100 mg twice a day.  Use MiraLax (over the counter) for constipation as needed.   Constipation Prevention   Complete by: As directed    Drink plenty of fluids.  Prune juice may be helpful.  You may use a stool softener, such as Colace (over the  counter) 100 mg twice a day.  Use MiraLax (over the counter) for constipation as needed.   Diet - low sodium heart healthy   Complete by: As directed    Diet - low sodium heart healthy   Complete by: As directed    Increase activity slowly as tolerated   Complete by: As directed    Increase activity slowly as tolerated   Complete by: As directed    Post-operative opioid taper instructions:   Complete by: As directed    POST-OPERATIVE OPIOID TAPER INSTRUCTIONS: It is important to wean off of your opioid medication as soon as possible. If you do not need pain medication after your surgery it is ok to stop day one. Opioids include: Codeine, Hydrocodone(Norco, Vicodin), Oxycodone(Percocet, oxycontin) and hydromorphone amongst others.  Long term and even short term use of opiods can cause: Increased pain response Dependence Constipation Depression Respiratory depression And more.  Withdrawal symptoms can include Flu like symptoms Nausea, vomiting And more Techniques to manage these symptoms Hydrate well Eat regular healthy meals Stay active Use relaxation techniques(deep breathing, meditating, yoga) Do Not substitute Alcohol to help with tapering If you have been on opioids for less than two weeks and do not have pain than it is ok to stop all together.  Plan to wean off of opioids This plan should start within one week post op of your joint replacement. Maintain the same interval or time between taking each dose and first decrease the dose.  Cut the total daily intake of opioids by one tablet each day Next start to increase the time between doses. The last dose that should be eliminated is the evening dose.      Post-operative opioid taper instructions:   Complete by: As directed    POST-OPERATIVE OPIOID TAPER INSTRUCTIONS: It is important to wean off of your opioid medication as soon as possible. If you do not need pain medication after your surgery it is ok to stop day  one. Opioids include: Codeine, Hydrocodone(Norco, Vicodin), Oxycodone(Percocet, oxycontin) and hydromorphone amongst others.  Long term and even short term use of opiods can cause: Increased pain response Dependence Constipation Depression Respiratory depression And more.  Withdrawal symptoms can include Flu like symptoms Nausea, vomiting And more Techniques to manage these symptoms Hydrate well Eat regular healthy meals Stay active Use relaxation techniques(deep breathing, meditating, yoga) Do Not substitute Alcohol to help with tapering If you have been on opioids for less than two weeks and do not have pain than it is ok to stop all together.  Plan to wean off of opioids This plan should start within one week post op of your joint replacement. Maintain the same interval or time between taking each dose and first decrease the dose.  Cut the total daily intake of opioids by one tablet each day Next start to increase the time between doses. The last dose that should be eliminated is  the evening dose.          Allergies as of 11/20/2022   No Known Allergies      Medication List     TAKE these medications    aspirin 81 MG chewable tablet Commonly known as: Aspirin Childrens Chew 1 tablet (81 mg total) by mouth in the morning and at bedtime.   carboxymethylcellulose 0.5 % Soln Commonly known as: REFRESH PLUS Place 1 drop into both eyes 3 (three) times daily as needed (dry/irritated eyes.).   docusate sodium 100 MG capsule Commonly known as: COLACE Take 100 mg by mouth daily.   DULoxetine 60 MG capsule Commonly known as: CYMBALTA TAKE 1 CAPSULE EVERY DAY   fluticasone 50 MCG/ACT nasal spray Commonly known as: FLONASE Place 2 sprays into both nostrils daily. What changed:  when to take this reasons to take this   losartan-hydrochlorothiazide 50-12.5 MG tablet Commonly known as: HYZAAR TAKE 1 TABLET EVERY DAY   methocarbamol 500 MG tablet Commonly known  as: ROBAXIN Take 1 tablet (500 mg total) by mouth every 8 (eight) hours as needed for muscle spasms.   multivitamin with minerals tablet Take 1 tablet by mouth in the morning.   neomycin-polymyxin-hydrocortisone OTIC solution Commonly known as: CORTISPORIN Place 3 drops into both ears 3 (three) times daily. What changed:  when to take this reasons to take this   ondansetron 4 MG tablet Commonly known as: Zofran Take 1 tablet (4 mg total) by mouth every 8 (eight) hours as needed for vomiting, refractory nausea / vomiting or nausea.   oxyCODONE-acetaminophen 5-325 MG tablet Commonly known as: Percocet Take 1 tablet by mouth every 4 (four) hours as needed for severe pain or moderate pain.   psyllium 58.6 % packet Commonly known as: METAMUCIL Take 1 packet by mouth every other day as needed (regularity).   simvastatin 20 MG tablet Commonly known as: ZOCOR Take 1 tablet (20 mg total) by mouth daily at 6 PM.   SUPER B COMPLEX PO Take 1 tablet by mouth in the morning.   Synthroid 88 MCG tablet Generic drug: levothyroxine Take 88 mcg by mouth daily before breakfast.   traZODone 50 MG tablet Commonly known as: DESYREL TAKE 1 TABLET (50 MG TOTAL) BY MOUTH AT BEDTIME. What changed: how much to take   VITAMIN C PO Take 1 tablet by mouth in the morning.   VITAMIN D-3 PO Take 1 tablet by mouth in the morning.          Signed: Thea Gist 11/20/2022, 9:38 AM  [facility] is now [facility] [employer] [facility] [facility] [address], [address], [place], [place] [address] Phone: [phone] Facebook  Instagram  [employer]

## 2022-11-20 NOTE — Progress Notes (Signed)
Discharge package printed and instruction given to pt. Verbalizes understanding.

## 2022-11-20 NOTE — Progress Notes (Signed)
   Subjective: 3 Days Post-Op Procedure(s) (LRB): TOTAL KNEE ARTHROPLASTY (Right)  Pain has improved some Pain worse mainly with ambulation and activity, minimal pain at rest Denies any new symptoms or issues Patient reports pain as moderate.  Objective:   VITALS:   Vitals:   11/19/22 1250 11/19/22 2035  BP: (!) 114/57 (!) 129/58  Pulse: 78 85  Resp: 17 18  Temp: (!) 97.5 F (36.4 C) 98.6 F (37 C)  SpO2: 96% 94%    Right knee: well healed incision to anterior knee Guarded rom  with flexion No rashes, mild edema distally bilaterally TED hose in place Nv intact distally  LABS No results for input(s): "HGB", "HCT", "WBC", "PLT" in the last 72 hours.  No results for input(s): "NA", "K", "BUN", "CREATININE", "GLUCOSE" in the last 72 hours.   Assessment/Plan: 3 Days Post-Op Procedure(s) (LRB): TOTAL KNEE ARTHROPLASTY (Right) Finish with therapy today and play for d/c this afternoon F/u in the office in 2 weeks Pain management Pulmonary toilet   Brad Antonieta Iba, MPAS [facility] is now [facility] [employer] [facility] [address], [address], [place], [place] [address] Phone: [phone] [email or web address] Facebook  [employer]

## 2022-11-20 NOTE — Progress Notes (Addendum)
Physical Therapy Treatment Patient Details Name: [name] MRN: [id number] DOB: 04/07/1950 Today's Date: 11/20/2022   History of Present Illness Pt s/p R TKR on 11/17/22. PMH: arthritis, HTN    PT Comments  Pt is making steady progress this session, meeting PT goals to d/c home with spouse assist.  See below for mobility. Educated on use of KI as needed, placed for stair training today for safety and improved wt shift to RLE. Pt reports no dizziness during session this am.   If plan is discharge home, recommend the following:     Can travel by private vehicle        Equipment Recommendations  Rolling walker (2 wheels)    Recommendations for Other Services       Precautions / Restrictions Precautions Precautions: Knee;Fall Required Braces or Orthoses: Knee Immobilizer - Right Restrictions Weight Bearing Restrictions: No Other Position/Activity Restrictions: WBAT     Mobility  Bed Mobility               General bed mobility comments: in recliner    Transfers Overall transfer level: Needs assistance Equipment used: Rolling walker (2 wheels) Transfers: Sit to/from Stand Sit to Stand: Contact guard assist, Supervision           General transfer comment: cues for LE management and use of UEs to self assist;    Ambulation/Gait Ambulation/Gait assistance: Supervision, Contact guard assist Gait Distance (Feet): 65 Feet Assistive device: Rolling walker (2 wheels) Gait Pattern/deviations: Step-to pattern, Decreased stance time - right Gait velocity: decr     General Gait Details: cues for sequence, posture and position from RW;   Stairs Stairs: Yes Stairs assistance: Contact guard assist, Supervision Stair Management: One rail Left, Step to pattern, With cane, Forwards Number of Stairs: 3 General stair comments: cues for sequence and technique; KI used for additional support; no LOB or knee buckling noted   Wheelchair Mobility     Tilt Bed     Modified Rankin (Stroke Patients Only)       Balance           Standing balance support: During functional activity, Reliant on assistive device for balance   Standing balance comment: reliant on RW for gait                            Cognition Arousal: Alert Behavior During Therapy: WFL for tasks assessed/performed Overall Cognitive Status: Within Functional Limits for tasks assessed                                          Exercises Total Joint Exercises Ankle Circles/Pumps: AROM, Both, 10 reps Quad Sets: AROM, Both, 5 reps Heel Slides: AAROM, Right, 10 reps    General Comments        Pertinent Vitals/Pain Pain Assessment Pain Assessment: Faces Faces Pain Scale: Hurts even more Pain Location: R knee Pain Descriptors / Indicators: Aching, Sore, Discomfort, Grimacing, Guarding, Moaning Pain Intervention(s): Limited activity within patient's tolerance, Monitored during session, Premedicated before session, Repositioned    Home Living                          Prior Function            PT Goals (current goals can now be found in the care  plan section) Acute Rehab PT Goals Patient Stated Goal: Regain IND PT Goal Formulation: With patient Time For Goal Achievement: 11/25/22 Potential to Achieve Goals: Good Progress towards PT goals: Progressing toward goals    Frequency    7X/week      PT Plan      Co-evaluation              AM-PAC PT "6 Clicks" Mobility   Outcome Measure  Help needed turning from your back to your side while in a flat bed without using bedrails?: A Little Help needed moving from lying on your back to sitting on the side of a flat bed without using bedrails?: A Little Help needed moving to and from a bed to a chair (including a wheelchair)?: A Little Help needed standing up from a chair using your arms (e.g., wheelchair or bedside chair)?: A Little Help needed to walk in hospital  room?: A Little Help needed climbing 3-5 steps with a railing? : A Little 6 Click Score: 18    End of Session Equipment Utilized During Treatment: Gait belt;Right knee immobilizer Activity Tolerance: Patient tolerated treatment well Patient left: in chair;with call bell/phone within reach;with chair alarm set Nurse Communication: Mobility status PT Visit Diagnosis: Difficulty in walking, not elsewhere classified (R26.2)     Time: 6578-4696 PT Time Calculation (min) (ACUTE ONLY): 28 min  Charges:    $Gait Training: 23-37 mins PT General Charges $$ ACUTE PT VISIT: 1 Visit                     Theoplis Garciagarcia, PT  Acute Rehab Dept [facility]) [phone]  11/20/2022    [facility] 11/20/2022, 11:05 AM

## 2022-11-21 DIAGNOSIS — M25561 Pain in right knee: Secondary | ICD-10-CM | POA: Diagnosis not present

## 2022-11-21 DIAGNOSIS — Z96651 Presence of right artificial knee joint: Secondary | ICD-10-CM | POA: Diagnosis not present

## 2022-11-21 DIAGNOSIS — M1711 Unilateral primary osteoarthritis, right knee: Secondary | ICD-10-CM | POA: Diagnosis not present

## 2022-11-22 ENCOUNTER — Telehealth: Payer: Self-pay

## 2022-11-22 DIAGNOSIS — M25561 Pain in right knee: Secondary | ICD-10-CM | POA: Diagnosis not present

## 2022-11-22 NOTE — Transitions of Care (Post Inpatient/ED Visit) (Signed)
   11/22/2022  Name: [name] MRN: [id number] DOB: 19-Jul-1950  Today's TOC FU Call Status: Today's TOC FU Call Status:: Unsuccessful Call (1st Attempt) Unsuccessful Call (1st Attempt) Date: 11/22/22  Attempted to reach the patient regarding the most recent Inpatient/ED visit.  Follow Up Plan: Additional outreach attempts will be made to reach the patient to complete the Transitions of Care (Post Inpatient/ED visit) call.   Jodelle Gross RN, BSN, CCM [facility] RN Care Coordinator/ Transitions of Care Direct Dial: [phone]  Fax: [phone]

## 2022-11-24 ENCOUNTER — Telehealth: Payer: Self-pay

## 2022-11-24 DIAGNOSIS — M25561 Pain in right knee: Secondary | ICD-10-CM | POA: Diagnosis not present

## 2022-11-24 NOTE — Transitions of Care (Post Inpatient/ED Visit) (Signed)
11/24/2022  Name: [name] MRN: [id number] DOB: 1951-02-09  Today's TOC FU Call Status: Today's TOC FU Call Status:: Successful TOC FU Call Completed TOC FU Call Complete Date: 11/24/22 Patient's Name and Date of Birth confirmed.  Transition Care Management Follow-up Telephone Call Date of Discharge: 11/20/22 Discharge Facility: [facility]) Type of Discharge: Inpatient Admission Primary Inpatient Discharge Diagnosis:: Right Total Knee Arthroplasty How have you been since you were released from the hospital?: Better (Patient feels better, she continues with some pain) Any questions or concerns?: No  Items Reviewed: Did you receive and understand the discharge instructions provided?: Yes Medications obtained,verified, and reconciled?: Yes (Medications Reviewed) Any new allergies since your discharge?: No Dietary orders reviewed?: No Do you have support at home?: Yes People in Home: spouse Name of Support/Comfort Primary Source: Fayrene Fearing  Medications Reviewed Today: Medications Reviewed Today     Reviewed by Jodelle Gross, RN (Case Manager) on 11/24/22 at 0932  Med List Status: <None>   Medication Order Taking? Sig Documenting Provider Last Dose Status Informant  Ascorbic Acid (VITAMIN C PO) [id number] Yes Take 1 tablet by mouth in the morning. [provider] Taking Active   aspirin (ASPIRIN CHILDRENS) 81 MG chewable tablet [id number] Yes Chew 1 tablet (81 mg total) by mouth in the morning and at bedtime. Beverely Low, MD Taking Active   B Complex-C (SUPER B COMPLEX PO) [id number] Yes Take 1 tablet by mouth in the morning. [provider] Taking Active Self  carboxymethylcellulose (REFRESH PLUS) 0.5 % SOLN [id number] Yes Place 1 drop into both eyes 3 (three) times daily as needed (dry/irritated eyes.). [provider] Taking Active   Cholecalciferol (VITAMIN D-3 PO) [id number] Yes Take 1 tablet by mouth in the morning. [provider]  Taking Active   docusate sodium (COLACE) 100 MG capsule [id number] Yes Take 100 mg by mouth daily. [provider] Taking Active Self  DULoxetine (CYMBALTA) 60 MG capsule [id number] Yes TAKE 1 CAPSULE EVERY DAY Myrlene Broker, MD Taking Active   fluticasone [facility]) 50 MCG/ACT nasal spray [id number] Yes Place 2 sprays into both nostrils daily.  Patient taking differently: Place 2 sprays into both nostrils daily as needed (sinus issues.).   Myrlene Broker, MD Taking Active   losartan-hydrochlorothiazide [facility]) 50-12.5 MG tablet [id number] Yes TAKE 1 TABLET EVERY DAY Myrlene Broker, MD Taking Active   methocarbamol (ROBAXIN) 500 MG tablet [id number] Yes Take 1 tablet (500 mg total) by mouth every 8 (eight) hours as needed for muscle spasms. Beverely Low, MD Taking Active   Multiple Vitamins-Minerals (MULTIVITAMIN WITH MINERALS) tablet [id number] Yes Take 1 tablet by mouth in the morning. [provider] Taking Active Self  neomycin-polymyxin-hydrocortisone (CORTISPORIN) OTIC solution [id number] Yes Place 3 drops into both ears 3 (three) times daily.  Patient taking differently: Place 3 drops into both ears daily as needed (itching/irritated ears).   Myrlene Broker, MD Taking Active   ondansetron [facility]) 4 MG tablet [id number] No Take 1 tablet (4 mg total) by mouth every 8 (eight) hours as needed for vomiting, refractory nausea / vomiting or nausea.  Patient not taking: Reported on 11/24/2022   Beverely Low, MD Not Taking Active   oxyCODONE-acetaminophen (PERCOCET) 5-325 MG tablet [id number] Yes Take 1 tablet by mouth every 4 (four) hours as needed for severe pain or moderate pain. Beverely Low, MD Taking Active   psyllium (METAMUCIL) 58.6 % packet [id number]  Take 1 packet by mouth every other day as needed (regularity). [provider]  Active   simvastatin (ZOCOR) 20 MG tablet [id number] Yes Take 1 tablet (20 mg total) by mouth daily at 6 PM.  Myrlene Broker, MD Taking Active   SYNTHROID 88 MCG tablet [id number] Yes Take 88 mcg by mouth daily before breakfast. [provider] Taking Active Self  traZODone (DESYREL) 50 MG tablet [id number] Yes TAKE 1 TABLET (50 MG TOTAL) BY MOUTH AT BEDTIME.  Patient taking differently: Take 75 mg by mouth at bedtime.   Myrlene Broker, MD Taking Active             Home Care and Equipment/Supplies: Were Home Health Services Ordered?: No Any new equipment or medical supplies ordered?: No  Functional Questionnaire: Do you need assistance with bathing/showering or dressing?: Yes Do you need assistance with meal preparation?: Yes Do you need assistance with eating?: No Do you have difficulty maintaining continence: No Do you need assistance with getting out of bed/getting out of a chair/moving?: No Do you have difficulty managing or taking your medications?: No  Follow up appointments reviewed: PCP Follow-up appointment confirmed?: NA Specialist Hospital Follow-up appointment confirmed?: Yes Date of Specialist follow-up appointment?: 11/29/22 Follow-Up Specialty Provider:: Dr. Charlann Boxer Do you need transportation to your follow-up appointment?: No Do you understand care options if your condition(s) worsen?: Yes-patient verbalized understanding  SDOH Interventions Today    Flowsheet Row Most Recent Value  SDOH Interventions   Food Insecurity Interventions Intervention Not Indicated  Housing Interventions Intervention Not Indicated  Transportation Interventions Intervention Not Indicated  Utilities Interventions Intervention Not Indicated      Jodelle Gross RN, BSN, CCM [facility] RN Care Coordinator/ Transitions of Care Direct Dial: [phone]  Fax: [phone]

## 2022-11-26 ENCOUNTER — Other Ambulatory Visit: Payer: Self-pay | Admitting: Internal Medicine

## 2022-11-27 DIAGNOSIS — M1711 Unilateral primary osteoarthritis, right knee: Secondary | ICD-10-CM | POA: Diagnosis not present

## 2022-11-27 DIAGNOSIS — M25561 Pain in right knee: Secondary | ICD-10-CM | POA: Diagnosis not present

## 2022-11-29 DIAGNOSIS — M25561 Pain in right knee: Secondary | ICD-10-CM | POA: Diagnosis not present

## 2022-11-29 DIAGNOSIS — Z4789 Encounter for other orthopedic aftercare: Secondary | ICD-10-CM | POA: Diagnosis not present

## 2022-12-01 DIAGNOSIS — M25561 Pain in right knee: Secondary | ICD-10-CM | POA: Diagnosis not present

## 2022-12-01 DIAGNOSIS — M1711 Unilateral primary osteoarthritis, right knee: Secondary | ICD-10-CM | POA: Diagnosis not present

## 2022-12-04 DIAGNOSIS — M25561 Pain in right knee: Secondary | ICD-10-CM | POA: Diagnosis not present

## 2022-12-07 ENCOUNTER — Other Ambulatory Visit: Payer: Self-pay | Admitting: Internal Medicine

## 2022-12-07 DIAGNOSIS — M1711 Unilateral primary osteoarthritis, right knee: Secondary | ICD-10-CM | POA: Diagnosis not present

## 2022-12-07 DIAGNOSIS — Z0000 Encounter for general adult medical examination without abnormal findings: Secondary | ICD-10-CM

## 2022-12-07 DIAGNOSIS — M25561 Pain in right knee: Secondary | ICD-10-CM | POA: Diagnosis not present

## 2022-12-11 DIAGNOSIS — M25561 Pain in right knee: Secondary | ICD-10-CM | POA: Diagnosis not present

## 2022-12-13 DIAGNOSIS — M25561 Pain in right knee: Secondary | ICD-10-CM | POA: Diagnosis not present

## 2022-12-15 DIAGNOSIS — M25561 Pain in right knee: Secondary | ICD-10-CM | POA: Diagnosis not present

## 2022-12-18 DIAGNOSIS — M25561 Pain in right knee: Secondary | ICD-10-CM | POA: Diagnosis not present

## 2022-12-21 DIAGNOSIS — M25561 Pain in right knee: Secondary | ICD-10-CM | POA: Diagnosis not present

## 2022-12-25 DIAGNOSIS — M25561 Pain in right knee: Secondary | ICD-10-CM | POA: Diagnosis not present

## 2022-12-26 ENCOUNTER — Other Ambulatory Visit: Payer: Self-pay | Admitting: Internal Medicine

## 2022-12-28 DIAGNOSIS — M1711 Unilateral primary osteoarthritis, right knee: Secondary | ICD-10-CM | POA: Diagnosis not present

## 2022-12-28 DIAGNOSIS — M25561 Pain in right knee: Secondary | ICD-10-CM | POA: Diagnosis not present

## 2023-01-01 DIAGNOSIS — M25561 Pain in right knee: Secondary | ICD-10-CM | POA: Diagnosis not present

## 2023-01-03 DIAGNOSIS — M25561 Pain in right knee: Secondary | ICD-10-CM | POA: Diagnosis not present

## 2023-01-06 ENCOUNTER — Other Ambulatory Visit: Payer: Self-pay | Admitting: Internal Medicine

## 2023-01-09 DIAGNOSIS — M1711 Unilateral primary osteoarthritis, right knee: Secondary | ICD-10-CM | POA: Diagnosis not present

## 2023-01-09 DIAGNOSIS — M25561 Pain in right knee: Secondary | ICD-10-CM | POA: Diagnosis not present

## 2023-01-11 DIAGNOSIS — M25561 Pain in right knee: Secondary | ICD-10-CM | POA: Diagnosis not present

## 2023-01-15 DIAGNOSIS — M25561 Pain in right knee: Secondary | ICD-10-CM | POA: Diagnosis not present

## 2023-01-18 DIAGNOSIS — M25561 Pain in right knee: Secondary | ICD-10-CM | POA: Diagnosis not present

## 2023-01-18 DIAGNOSIS — M1711 Unilateral primary osteoarthritis, right knee: Secondary | ICD-10-CM | POA: Diagnosis not present

## 2023-01-22 DIAGNOSIS — M25561 Pain in right knee: Secondary | ICD-10-CM | POA: Diagnosis not present

## 2023-01-23 ENCOUNTER — Ambulatory Visit
Admission: RE | Admit: 2023-01-23 | Discharge: 2023-01-23 | Disposition: A | Discharge status: Home / Self Care | Payer: Medicare PPO | Source: Ambulatory Visit | Attending: Internal Medicine | Admitting: Internal Medicine

## 2023-01-23 DIAGNOSIS — Z0000 Encounter for general adult medical examination without abnormal findings: Secondary | ICD-10-CM

## 2023-01-23 DIAGNOSIS — Z1231 Encounter for screening mammogram for malignant neoplasm of breast: Secondary | ICD-10-CM | POA: Diagnosis not present

## 2023-01-24 LAB — HM MAMMOGRAPHY

## 2023-01-25 ENCOUNTER — Encounter: Payer: Self-pay | Admitting: Internal Medicine

## 2023-01-25 DIAGNOSIS — M1711 Unilateral primary osteoarthritis, right knee: Secondary | ICD-10-CM | POA: Diagnosis not present

## 2023-01-25 DIAGNOSIS — M25561 Pain in right knee: Secondary | ICD-10-CM | POA: Diagnosis not present

## 2023-03-05 ENCOUNTER — Ambulatory Visit: Payer: Medicare PPO

## 2023-03-05 VITALS — BP 124/78 | HR 74 | Temp 98.6°F | Ht 67.0 in | Wt 183.0 lb

## 2023-03-05 DIAGNOSIS — Z0000 Encounter for general adult medical examination without abnormal findings: Secondary | ICD-10-CM | POA: Diagnosis not present

## 2023-03-05 DIAGNOSIS — F411 Generalized anxiety disorder: Secondary | ICD-10-CM | POA: Insufficient documentation

## 2023-03-05 HISTORY — DX: Generalized anxiety disorder: F41.1

## 2023-03-05 NOTE — Patient Instructions (Signed)
It was great speaking with you today!  Please schedule your next Medicare Wellness Visit with your Nurse Health Advisor in 1 year by calling [phone]. 

## 2023-03-05 NOTE — Progress Notes (Addendum)
Subjective:   [name] is a 72 y.o. female who presents for Medicare Annual (Subsequent) preventive examination.  Visit Complete: Virtual I connected with  Antoya Seacat Mcginnis on 03/05/23 by a audio enabled telemedicine application and verified that I am speaking with the correct person using two identifiers.  Patient Location: Home  Provider Location: Office/Clinic  I discussed the limitations of evaluation and management by telemedicine. The patient expressed understanding and agreed to proceed.  Vital Signs: Because this visit was a virtual/telehealth visit, some criteria may be missing or patient reported. Any vitals not documented were not able to be obtained and vitals that have been documented are patient reported.  Patient Medicare AWV questionnaire was completed by the patient on N/A; I have confirmed that all information answered by patient is correct and no changes since this date.  Cardiac Risk Factors include: advanced age (>23men, >34 women);dyslipidemia;hypertension     Objective:    Today's Vitals   03/05/23 0909  BP: 124/78  Pulse: 74  Temp: 98.6 F (37 C)  SpO2: 96%  Weight: 183 lb (83 kg)  Height: 5\' 7"  (1.702 m)   Body mass index is 28.66 kg/m. Per patient no change in vitals since last visit, unable to obtain new vitals due to telehealth visit     03/05/2023    9:14 AM 11/17/2022    5:45 PM 11/17/2022   10:41 AM 11/08/2022   10:03 AM 03/02/2022   10:04 AM 01/29/2020    6:23 PM 01/29/2020    4:52 PM  Advanced Directives  Does Patient Have a Medical Advance Directive? No No No No No No No  Would patient like information on creating a medical advance directive? No - Patient declined No - Patient declined No - Patient declined  No - Patient declined No - Patient declined     Current Medications (verified) Outpatient Encounter Medications as of 03/05/2023  Medication Sig   Ascorbic Acid (VITAMIN C PO) Take 1 tablet by mouth in the morning.   B  Complex-C (SUPER B COMPLEX PO) Take 1 tablet by mouth in the morning.   carboxymethylcellulose (REFRESH PLUS) 0.5 % SOLN Place 1 drop into both eyes 3 (three) times daily as needed (dry/irritated eyes.).   Cholecalciferol (VITAMIN D-3 PO) Take 1 tablet by mouth in the morning.   docusate sodium (COLACE) 100 MG capsule Take 100 mg by mouth daily.   DULoxetine (CYMBALTA) 60 MG capsule TAKE 1 CAPSULE EVERY DAY   fluticasone (FLONASE) 50 MCG/ACT nasal spray Place 2 sprays into both nostrils daily. (Patient taking differently: Place 2 sprays into both nostrils daily as needed (sinus issues.).)   losartan-hydrochlorothiazide (HYZAAR) 50-12.5 MG tablet TAKE 1 TABLET EVERY DAY   methocarbamol (ROBAXIN) 500 MG tablet Take 1 tablet (500 mg total) by mouth every 8 (eight) hours as needed for muscle spasms.   Multiple Vitamins-Minerals (MULTIVITAMIN WITH MINERALS) tablet Take 1 tablet by mouth in the morning.   neomycin-polymyxin-hydrocortisone (CORTISPORIN) OTIC solution Place 3 drops into both ears 3 (three) times daily. (Patient taking differently: Place 3 drops into both ears daily as needed (itching/irritated ears).)   ondansetron (ZOFRAN) 4 MG tablet Take 1 tablet (4 mg total) by mouth every 8 (eight) hours as needed for vomiting, refractory nausea / vomiting or nausea.   oxyCODONE-acetaminophen (PERCOCET) 5-325 MG tablet Take 1 tablet by mouth every 4 (four) hours as needed for severe pain or moderate pain.   simvastatin (ZOCOR) 20 MG tablet TAKE 1 TABLET (20  MG TOTAL) BY MOUTH DAILY AT 6 PM.   SYNTHROID 88 MCG tablet Take 88 mcg by mouth daily before breakfast.   traZODone (DESYREL) 50 MG tablet TAKE 1 TABLET (50 MG TOTAL) BY MOUTH AT BEDTIME.   [DISCONTINUED] psyllium (METAMUCIL) 58.6 % packet Take 1 packet by mouth every other day as needed (regularity).   No facility-administered encounter medications on file as of 03/05/2023.    Allergies (verified) Patient has no known allergies.    History: Past Medical History:  Diagnosis Date   Anxiety    Anxiety state 03/05/2023   Arthritis    Blood in stool    Colon polyps    Hyperlipidemia    Hypertension    Thyroid disease    Past Surgical History:  Procedure Laterality Date   ABDOMINAL HYSTERECTOMY     KNEE SURGERY Left 2018   arthroscopy   TOTAL KNEE ARTHROPLASTY Right 11/17/2022   Procedure: TOTAL KNEE ARTHROPLASTY;  Surgeon: Beverely Low, MD;  Location: [facility];  Service: Orthopedics;  Laterality: Right;   Family History  Problem Relation Age of Onset   Hypertension Mother    Arthritis Sister    Cancer Sister        breast and colon   Diabetes Sister    Diabetes Paternal Aunt    Hypertension Paternal Grandmother    Social History   Socioeconomic History   Marital status: Married    Spouse name: Not on file   Number of children: Not on file   Years of education: Not on file   Highest education level: Not on file  Occupational History   Not on file  Tobacco Use   Smoking status: Never   Smokeless tobacco: Never  Vaping Use   Vaping status: Never Used  Substance and Sexual Activity   Alcohol use: No    Alcohol/week: 0.0 standard drinks of alcohol   Drug use: No   Sexual activity: Not on file  Other Topics Concern   Not on file  Social History Narrative   Not on file   Social Drivers of Health   Financial Resource Strain: Low Risk  (03/05/2023)   Overall Financial Resource Strain (CARDIA)    Difficulty of Paying Living Expenses: Not hard at all  Food Insecurity: No Food Insecurity (03/05/2023)   Hunger Vital Sign    Worried About Running Out of Food in the Last Year: Never true    Ran Out of Food in the Last Year: Never true  Transportation Needs: No Transportation Needs (03/05/2023)   PRAPARE - Administrator, Civil Service (Medical): No    Lack of Transportation (Non-Medical): No  Physical Activity: Sufficiently Active (03/05/2023)   Exercise Vital Sign    Days of  Exercise per Week: 4 days    Minutes of Exercise per Session: 60 min  Stress: No Stress Concern Present (03/05/2023)   [employer] of Occupational Health - Occupational Stress Questionnaire    Feeling of Stress : Not at all  Social Connections: Socially Integrated (03/05/2023)   Social Connection and Isolation Panel [NHANES]    Frequency of Communication with Friends and Family: More than three times a week    Frequency of Social Gatherings with Friends and Family: More than three times a week    Attends Religious Services: More than 4 times per year    Active Member of [employer] or Organizations: Yes    Attends Banker Meetings: More than 4 times per year  Marital Status: Married    Tobacco Counseling Counseling given: Not Answered   Clinical Intake:  Pre-visit preparation completed: Yes  Pain : No/denies pain     BMI - recorded: 28 Nutritional Status: BMI 25 -29 Overweight Nutritional Risks: None Diabetes: No  How often do you need to have someone help you when you read instructions, pamphlets, or other written materials from your doctor or pharmacy?: 1 - Never What is the last grade level you completed in school?: bachelors degree  Interpreter Needed?: No  Information entered by :: Elyse Jarvis, CMA   Activities of Daily Living    03/05/2023    9:14 AM 11/17/2022    5:45 PM  In your present state of health, do you have any difficulty performing the following activities:  Hearing? 0 0  Vision? 0 0  Difficulty concentrating or making decisions? 0 0  Walking or climbing stairs? 0 1  Dressing or bathing? 0 0  Doing errands, shopping? 0 0  Preparing Food and eating ? N   Using the Toilet? N   In the past six months, have you accidently leaked urine? N   Do you have problems with loss of bowel control? N   Managing your Medications? N   Managing your Finances? N   Housekeeping or managing your Housekeeping? N     Patient Care Team: Myrlene Broker, MD as PCP - General (Internal Medicine) Charna Elizabeth, MD as Consulting Physician (Gastroenterology) Doristine Bosworth., MD as Consulting Physician (Endocrinology) Manning Charity, OD as Referring Physician (Optometry)  Indicate any recent Medical Services you may have received from other than [facility] providers in the past year (date may be approximate).     Assessment:   This is a routine wellness examination for [name].  Hearing/Vision screen Patient denied any hearing difficulty. No hearing aids. Patient does not wear any corrective lenses.   Goals Addressed             This Visit's Progress    Patient Stated       I would like to work on losing some weight.       Depression Screen    03/05/2023    9:13 AM 08/15/2022    1:50 PM 03/02/2022   10:16 AM 03/02/2022    8:15 AM 08/29/2021    8:23 AM 02/28/2021   10:07 AM 02/27/2020   10:02 AM  PHQ 2/9 Scores  PHQ - 2 Score 0 0 0 0 0 0 0  PHQ- 9 Score   0 0 1 0 0    Fall Risk    03/05/2023    9:14 AM 08/15/2022    1:50 PM 03/02/2022   10:16 AM 03/02/2022    8:21 AM 03/02/2022    8:14 AM  Fall Risk   Falls in the past year? 0 0 0 0 0  Number falls in past yr: 0 0 0 0 0  Injury with Fall? 0 0 0 0 0  Risk for fall due to : No Fall Risks No Fall Risks No Fall Risks    Follow up Falls evaluation completed Falls evaluation completed Falls prevention discussed Falls evaluation completed Falls evaluation completed    MEDICARE RISK AT HOME: Medicare Risk at Home Any stairs in or around the home?: Yes If so, are there any without handrails?: No Home free of loose throw rugs in walkways, pet beds, electrical cords, etc?: Yes Adequate lighting in your home to reduce risk of falls?: Yes  Life alert?: No Use of a cane, walker or w/c?: No Grab bars in the bathroom?: Yes Shower chair or bench in shower?: Yes Elevated toilet seat or a handicapped toilet?: Yes  TIMED UP AND GO:  Was the test performed?  No     Cognitive Function:  Patient is cogitatively intact.      03/05/2023    9:15 AM 03/02/2022   10:17 AM  6CIT Screen  What Year? 0 points 0 points  What month? 0 points 0 points  What time? 0 points 0 points  Count back from 20 0 points 0 points  Months in reverse 0 points 0 points  Repeat phrase 0 points 0 points  Total Score 0 points 0 points    Immunizations Immunization History  Administered Date(s) Administered   Fluad Quad(high Dose 65+) 11/26/2018, 12/14/2020, 11/25/2021   Fluzone Influenza virus vaccine,trivalent (IIV3), split virus 12/25/2011, 12/11/2013   Influenza, High Dose Seasonal PF 12/15/2015, 12/13/2016, 11/27/2017, 12/12/2019   Influenza-Unspecified 11/27/2017, 12/06/2018, 12/11/2022   PFIZER(Purple Top)SARS-COV-2 Vaccination 04/18/2019, 05/09/2019, 12/30/2019   Pfizer Covid-19 Vaccine Bivalent Booster 29yrs & up 01/03/2021   Pneumococcal Conjugate-13 12/13/2016   Pneumococcal Polysaccharide-23 01/03/2013, 12/17/2017   Tdap 06/12/2007, 08/28/2011   Zoster Recombinant(Shingrix) 12/30/2018, 02/28/2019   Zoster, Live 08/23/2010, 08/24/2010    TDAP status: Up to date  Flu Vaccine status: Up to date  Pneumococcal vaccine status: Up to date  Covid-19 vaccine status: Completed vaccines  Qualifies for Shingles Vaccine? Yes   Zostavax completed No   Shingrix Completed?: Yes  Screening Tests Health Maintenance  Topic Date Due   COVID-19 Vaccine (5 - 2024-25 season) 03/21/2023 (Originally 11/12/2022)   Medicare Annual Wellness (AWV)  03/04/2024   MAMMOGRAM  01/23/2025   Colonoscopy  04/07/2025   Pneumonia Vaccine 64+ Years old  Completed   INFLUENZA VACCINE  Completed   DEXA SCAN  Completed   Hepatitis C Screening  Completed   Zoster Vaccines- Shingrix  Completed   HPV VACCINES  Aged Out   DTaP/Tdap/Td  Discontinued    Health Maintenance  There are no preventive care reminders to display for this patient.   Colorectal cancer screening: Type of  screening: Colonoscopy. Completed 04/07/20. Repeat every 5 years  Mammogram status: Completed 01/24/23. Repeat every year  Bone Density status: Completed 08/31/2010. Results reflect: Bone density results: NORMAL. Repeat every n/a years.  Lung Cancer Screening: (Low Dose CT Chest recommended if Age 24-80 years, 20 pack-year currently smoking OR have quit w/in 15years.) does not qualify.   Lung Cancer Screening Referral: n/a  Additional Screening:  Hepatitis C Screening: does qualify; Completed 05/20/2015  Vision Screening: Recommended annual ophthalmology exams for early detection of glaucoma and other disorders of the eye. Is the patient up to date with their annual eye exam?  Yes  Who is the provider or what is the name of the office in which the patient attends annual eye exams? Dr. Emily Filbert If pt is not established with a provider, would they like to be referred to a provider to establish care? No .   Dental Screening: Recommended annual dental exams for proper oral hygiene   Community Resource Referral / Chronic Care Management: CRR required this visit?  No   CCM required this visit?  No     Plan:     I have personally reviewed and noted the following in the patient's chart:   Medical and social history Use of alcohol, tobacco or illicit drugs  Current medications and supplements including  opioid prescriptions. Patient is not currently taking opioid prescriptions. Functional ability and status Nutritional status Physical activity Advanced directives List of other physicians Hospitalizations, surgeries, and ER visits in previous 12 months Vitals Screenings to include cognitive, depression, and falls Referrals and appointments  In addition, I have reviewed and discussed with patient certain preventive protocols, quality metrics, and best practice recommendations. A written personalized care plan for preventive services as well as general preventive health recommendations  were provided to patient.     Marinus Maw, CMA   03/05/2023   After Visit Summary: (MyChart) Due to this being a telephonic visit, the after visit summary with patients personalized plan was offered to patient via MyChart   Nurse Notes: n/a

## 2023-03-06 DIAGNOSIS — E559 Vitamin D deficiency, unspecified: Secondary | ICD-10-CM | POA: Diagnosis not present

## 2023-03-06 DIAGNOSIS — I10 Essential (primary) hypertension: Secondary | ICD-10-CM | POA: Diagnosis not present

## 2023-03-06 DIAGNOSIS — E039 Hypothyroidism, unspecified: Secondary | ICD-10-CM | POA: Diagnosis not present

## 2023-03-12 NOTE — Addendum Note (Signed)
Addended by: Marinus Maw on: 03/12/2023 07:38 AM   Modules accepted: Level of Service

## 2023-03-15 ENCOUNTER — Ambulatory Visit: Payer: Medicare PPO | Admitting: Internal Medicine

## 2023-03-15 ENCOUNTER — Encounter: Payer: Self-pay | Admitting: Internal Medicine

## 2023-03-15 VITALS — BP 134/86 | HR 68 | Temp 97.9°F | Ht 67.0 in | Wt 187.0 lb

## 2023-03-15 DIAGNOSIS — E782 Mixed hyperlipidemia: Secondary | ICD-10-CM | POA: Diagnosis not present

## 2023-03-15 DIAGNOSIS — G4700 Insomnia, unspecified: Secondary | ICD-10-CM

## 2023-03-15 DIAGNOSIS — I10 Essential (primary) hypertension: Secondary | ICD-10-CM

## 2023-03-15 DIAGNOSIS — R7301 Impaired fasting glucose: Secondary | ICD-10-CM | POA: Diagnosis not present

## 2023-03-15 DIAGNOSIS — Z0000 Encounter for general adult medical examination without abnormal findings: Secondary | ICD-10-CM | POA: Diagnosis not present

## 2023-03-15 DIAGNOSIS — E039 Hypothyroidism, unspecified: Secondary | ICD-10-CM

## 2023-03-15 DIAGNOSIS — F330 Major depressive disorder, recurrent, mild: Secondary | ICD-10-CM | POA: Diagnosis not present

## 2023-03-15 LAB — COMPREHENSIVE METABOLIC PANEL
ALT: 9 U/L (ref 0–35)
AST: 16 U/L (ref 0–37)
Albumin: 3.9 g/dL (ref 3.5–5.2)
Alkaline Phosphatase: 82 U/L (ref 39–117)
BUN: 16 mg/dL (ref 6–23)
CO2: 26 meq/L (ref 19–32)
Calcium: 9 mg/dL (ref 8.4–10.5)
Chloride: 108 meq/L (ref 96–112)
Creatinine, Ser: 0.68 mg/dL (ref 0.40–1.20)
GFR: 86.68 mL/min (ref >60.00)
Glucose, Bld: 98 mg/dL (ref 70–99)
Potassium: 3.9 meq/L (ref 3.5–5.1)
Sodium: 141 meq/L (ref 135–145)
Total Bilirubin: 0.3 mg/dL (ref 0.2–1.2)
Total Protein: 7 g/dL (ref 6.0–8.3)

## 2023-03-15 LAB — CBC
HCT: 39.6 % (ref 36.0–46.0)
Hemoglobin: 12.9 g/dL (ref 12.0–15.0)
MCHC: 32.5 g/dL (ref 30.0–36.0)
MCV: 89.9 fL (ref 78.0–100.0)
Platelets: 273 10*3/uL (ref 150.0–400.0)
RBC: 4.4 Mil/uL (ref 3.87–5.11)
RDW: 15.2 % (ref 11.5–15.5)
WBC: 4.6 10*3/uL (ref 4.0–10.5)

## 2023-03-15 LAB — LIPID PANEL
Cholesterol: 169 mg/dL (ref 0–200)
HDL: 56.3 mg/dL (ref >39.00)
LDL Cholesterol: 102 mg/dL — ABNORMAL HIGH (ref 0–99)
NonHDL: 112.94
Total CHOL/HDL Ratio: 3
Triglycerides: 54 mg/dL (ref 0.0–149.0)
VLDL: 10.8 mg/dL (ref 0.0–40.0)

## 2023-03-15 LAB — HEMOGLOBIN A1C: Hgb A1c MFr Bld: 6.1 % (ref 4.6–6.5)

## 2023-03-15 MED ORDER — SIMVASTATIN 20 MG PO TABS: 20.0000 mg | ORAL_TABLET | Freq: Every day | ORAL | 3 refills | Status: AC

## 2023-03-15 MED ORDER — NYSTATIN-TRIAMCINOLONE 100000-0.1 UNIT/GM-% EX OINT: 1.0000 | TOPICAL_OINTMENT | Freq: Two times a day (BID) | CUTANEOUS | 3 refills | Status: DC

## 2023-03-15 MED ORDER — DULOXETINE HCL 60 MG PO CPEP: 120.0000 mg | ORAL_CAPSULE | Freq: Every day | ORAL | 3 refills | Status: DC

## 2023-03-15 MED ORDER — LOSARTAN POTASSIUM-HCTZ 50-12.5 MG PO TABS: 1.0000 | ORAL_TABLET | Freq: Every day | ORAL | 3 refills | Status: DC

## 2023-03-15 MED ORDER — NEOMYCIN-POLYMYXIN-HC 3.5-10000-1 OT SOLN: 3.0000 [drp] | Freq: Three times a day (TID) | OTIC | 0 refills | Status: AC

## 2023-03-15 NOTE — Progress Notes (Signed)
   Subjective:   Patient ID: [name], female    DOB: 04/29/1950, 73 y.o.   MRN: [id number]  HPI The patient is here for physical.  PMH, [facility], social history reviewed and updated  Review of Systems  Constitutional:  Positive for fatigue.  HENT: Negative.    Eyes: Negative.   Respiratory:  Negative for cough, chest tightness and shortness of breath.   Cardiovascular:  Negative for chest pain, palpitations and leg swelling.  Gastrointestinal:  Negative for abdominal distention, abdominal pain, constipation, diarrhea, nausea and vomiting.  Musculoskeletal: Negative.   Skin: Negative.   Neurological: Negative.   Psychiatric/Behavioral: Negative.      Objective:  Physical Exam Constitutional:      Appearance: She is well-developed.  HENT:     Head: Normocephalic and atraumatic.  Cardiovascular:     Rate and Rhythm: Normal rate and regular rhythm.  Pulmonary:     Effort: Pulmonary effort is normal. No respiratory distress.     Breath sounds: Normal breath sounds. No wheezing or rales.  Abdominal:     General: Bowel sounds are normal. There is no distension.     Palpations: Abdomen is soft.     Tenderness: There is no abdominal tenderness. There is no rebound.  Musculoskeletal:     Cervical back: Normal range of motion.  Skin:    General: Skin is warm and dry.  Neurological:     Mental Status: She is alert and oriented to person, place, and time.     Coordination: Coordination normal.     Vitals:   03/15/23 0937  BP: 134/86  Pulse: 68  Temp: 97.9 F (36.6 C)  TempSrc: Oral  SpO2: 98%  Weight: 187 lb (84.8 kg)  Height: 5' 7 (1.702 m)    Assessment & Plan:

## 2023-03-15 NOTE — Assessment & Plan Note (Signed)
 Flu shot up to date. Pneumonia complete. Shingrix complete. Tetanus up to date. Colonoscopy up to date. Mammogram up to date, pap smear aged out and dexa up to date. Counseled about sun safety and mole surveillance. Counseled about the dangers of distracted driving. Given 10 year screening recommendations.

## 2023-03-15 NOTE — Assessment & Plan Note (Signed)
 Taking trazodone 50 mg daily at night time and this is working well. Continue.

## 2023-03-15 NOTE — Assessment & Plan Note (Signed)
 She has increased on her own duloxetine and well controlled. Will match prescription to what she is taking.

## 2023-03-15 NOTE — Assessment & Plan Note (Signed)
 BP at goal checking CMP and adjust as needed her losartan/hydrochlorothiazide 50/12.5 mg daily.

## 2023-03-15 NOTE — Assessment & Plan Note (Signed)
 Checking HgA1c and adjust as needed. Eating more sweets lately.

## 2023-03-15 NOTE — Assessment & Plan Note (Signed)
Checking lipid panel and adjust simvastatin as needed.  

## 2023-03-15 NOTE — Assessment & Plan Note (Signed)
 Recent labs at goal on synthroid 88 mcg daily.

## 2023-03-19 ENCOUNTER — Telehealth: Payer: Self-pay

## 2023-03-19 NOTE — Telephone Encounter (Signed)
 Called patient back and went over results there were no concerns or questions pt verbalized she understood

## 2023-03-19 NOTE — Telephone Encounter (Signed)
 Copied from CRM [id number]. Topic: Clinical - Lab/Test Results >> Mar 19, 2023 12:13 PM Pinkey ORN wrote: Reason for CRM: Physical Results >> Mar 19, 2023 12:14 PM Pinkey ORN wrote: Patient states she hasn't heard anything back prior to completing her physical and is requesting a call back with results. Call back number [phone]

## 2023-03-29 ENCOUNTER — Telehealth: Payer: Self-pay

## 2023-03-29 NOTE — Telephone Encounter (Signed)
Copied from CRM [id number]. Topic: Clinical - Prescription Issue >> Mar 28, 2023  4:03 PM Florestine Avers wrote:  Reason for CRM: Patient called wanting Dr. Okey Dupre nurse to call her back she has some questions about one of medications dosages. Would not go into detail with me, requested Dr. Lawana Chambers nurse.

## 2023-03-29 NOTE — Telephone Encounter (Signed)
Spoke with patient and she stated that was the incorrect medication that she needed the increase for.   DULoxetine (CYMBALTA) 60 MG capsule was     Patient was referring to her Trazodone she stated that she takes 1 and half pill daily and this is what needed to be increased. Please correct dosage on both prescription for patient

## 2023-03-30 NOTE — Telephone Encounter (Signed)
I will need actual mg confirmation as we discussed the change at the visit by name to the cymbalta. The trazodone is her sleeping medicine is that the one she increased?

## 2023-03-30 NOTE — Telephone Encounter (Signed)
Yes she increased that to 1 1/2 pill.

## 2023-04-02 MED ORDER — DULOXETINE HCL 60 MG PO CPEP: 60.0000 mg | ORAL_CAPSULE | Freq: Every day | ORAL | 3 refills | Status: AC

## 2023-04-02 MED ORDER — TRAZODONE HCL 50 MG PO TABS: 75.0000 mg | ORAL_TABLET | Freq: Every day | ORAL | 3 refills | Status: DC

## 2023-04-02 NOTE — Telephone Encounter (Signed)
Both are sent in. 

## 2023-04-02 NOTE — Addendum Note (Signed)
Addended by: Myrlene Broker on: 04/02/2023 07:36 AM   Modules accepted: Orders

## 2023-04-05 ENCOUNTER — Ambulatory Visit: Payer: Self-pay | Admitting: Internal Medicine

## 2023-04-05 NOTE — Telephone Encounter (Addendum)
  Chief Complaint: difficulty swallowing Symptoms: difficulty swallowing, vomiting Frequency: a month ago Pertinent Negatives: Patient denies difficulty breathing, chest pain during events.  Disposition: [] ED /[] Urgent Care (no appt availability in office) / [x] Appointment(In office/virtual)/ []  [facility]/ [] Home Care/ [] Refused Recommended Disposition /[] [facility]/ []  Follow-up with PCP Additional Notes: Patient calling for difficulty swallowing with certain foods.Reports she has to vomit those foods to dislodge and alleviate sx. Denies difficulty breathing, chest pain. Spoke to PCP at last wellness but is wanting an Rx to alleviate symptoms. Scheduled patient per protocol on 04/16/2023. Patient verbalized understanding and to call back with worsening symptoms. Patient also requesting call back if Rx can be given and cancel upcoming appt.    Copied from CRM [id number]. Topic: Clinical - Red Word Triage >> Apr 05, 2023 12:39 PM Denese Killings wrote: Red Word that prompted transfer to Nurse Triage: Patient is having pain while eating certain foods and she has trouble swallowing it. Patient has the urge to throw up food. Patient wants to know if she prescribed something for it. Reason for Disposition  Swallowing difficulty is a chronic symptom (recurrent or ongoing AND present > 4 weeks)  Answer Assessment - Initial Assessment Questions 1. DESCRIPTION: "Tell me more about this problem." "Are you  having trouble swallowing liquids, solids, or both?" "Any trouble with swallowing saliva (spit)?"     Reports that has difficulty swallowing meat, esp. Steak. And she feels like it "gets stuck in chest" and it burns and "won't go down and throws" 2. SEVERITY: "How bad is the swallowing difficulty?"  (e.g., Scale 1-10; or mild, moderate, severe)   - MILD (0-3): Occasional swallowing difficulty; has trouble swallowing certain types of foods or liquids.   - MODERATE (4-7): Frequent  swallowing difficulty; only able to swallow small amounts of foods and fluids.   - SEVERE (8-10): Unable to swallow any foods, fluids, or saliva; sensation of "lump in throat" or "something stuck in throat", and frequent drooling or spitting may be present.     mild 3. ONSET: "When did the swallowing problems begin?"      A month ago Of note, talked to PCP at last wellness visit and reports possible narrowing of esophagus Wondering if she can have Rx to address sx 4. CAUSE: "What do you think is causing the problem?"  (e.g., dry mouth, food or pill stuck in throat, mouth pain, sore throat, progression of disease process such as dementia or Parkinson's disease).      "Certain foods I eat" 5. CHRONIC or RECURRENT: "Is this a new problem for you?"  If No, ask: "How long have you had this problem?" (e.g., days, weeks, months)      chronic 6. OTHER SYMPTOMS: "Do you have any other symptoms?" (e.g., chest pain, difficulty breathing, mouth sores, sore throat, swollen tongue, chest pain)     no  Protocols used: Swallowing Difficulty-A-AH

## 2023-04-09 MED ORDER — PANTOPRAZOLE SODIUM 40 MG PO TBEC: 40.0000 mg | DELAYED_RELEASE_TABLET | Freq: Every day | ORAL | 3 refills | Status: AC

## 2023-04-09 NOTE — Addendum Note (Signed)
Addended by: Myrlene Broker on: 04/09/2023 07:44 AM   Modules accepted: Orders

## 2023-04-09 NOTE — Telephone Encounter (Signed)
Patient will call back to reschedule she has not started medication

## 2023-04-09 NOTE — Telephone Encounter (Signed)
Sent in protonix to take 1 pill daily before breakfast. Recommend follow up about 4 weeks after starting as it can take that long to impact swallowing. Sooner if worsening. Please move back apt to appropriate timeline.

## 2023-04-12 DIAGNOSIS — H5213 Myopia, bilateral: Secondary | ICD-10-CM | POA: Diagnosis not present

## 2023-04-12 DIAGNOSIS — H10413 Chronic giant papillary conjunctivitis, bilateral: Secondary | ICD-10-CM | POA: Diagnosis not present

## 2023-04-16 ENCOUNTER — Ambulatory Visit: Payer: Medicare PPO | Admitting: Internal Medicine

## 2023-04-17 DIAGNOSIS — Z4789 Encounter for other orthopedic aftercare: Secondary | ICD-10-CM | POA: Diagnosis not present

## 2023-05-08 ENCOUNTER — Telehealth: Payer: Self-pay | Admitting: Internal Medicine

## 2023-05-08 NOTE — Telephone Encounter (Signed)
 Copied from CRM [id number]. Topic: Clinical - Prescription Issue >> May 08, 2023  1:58 PM Gurney Maxin H wrote: Reason for CRM: Patient called in stating that her refill for the traZODone (DESYREL) 50 MG tablet, was sent to [facility] and should have been sent to [facility] pharmacy on file.

## 2023-05-10 ENCOUNTER — Other Ambulatory Visit: Payer: Self-pay

## 2023-05-10 MED ORDER — TRAZODONE HCL 50 MG PO TABS: 75.0000 mg | ORAL_TABLET | Freq: Every day | ORAL | 3 refills | Status: DC

## 2023-05-10 NOTE — Telephone Encounter (Signed)
 This has been corrected.

## 2023-07-11 DIAGNOSIS — M1712 Unilateral primary osteoarthritis, left knee: Secondary | ICD-10-CM | POA: Diagnosis not present

## 2023-07-27 ENCOUNTER — Telehealth: Payer: Self-pay

## 2023-07-27 NOTE — Telephone Encounter (Signed)
 This patient is appearing on a report for being at risk of failing the adherence measure for cholesterol (statin) medications this calendar year.   Medication: simvastatin  20 mg daily Last fill date: 03/16/23 for 90 day supply  Spoke with patient who states she takes her medication every day. Notes she is running low on supply at home. Refills remain on current script. Contacted pharmacy to facilitate refills.   Abelina Abide, PharmD PGY1 Pharmacy Resident 07/27/2023 10:15 AM

## 2023-08-07 ENCOUNTER — Other Ambulatory Visit: Payer: Self-pay | Admitting: Internal Medicine

## 2023-09-05 DIAGNOSIS — E66811 Obesity, class 1: Secondary | ICD-10-CM | POA: Diagnosis not present

## 2023-09-05 DIAGNOSIS — I10 Essential (primary) hypertension: Secondary | ICD-10-CM | POA: Diagnosis not present

## 2023-09-05 DIAGNOSIS — E039 Hypothyroidism, unspecified: Secondary | ICD-10-CM | POA: Diagnosis not present

## 2023-09-05 DIAGNOSIS — E6609 Other obesity due to excess calories: Secondary | ICD-10-CM | POA: Diagnosis not present

## 2023-09-05 DIAGNOSIS — E559 Vitamin D deficiency, unspecified: Secondary | ICD-10-CM | POA: Diagnosis not present

## 2023-09-05 DIAGNOSIS — Z6830 Body mass index (BMI) 30.0-30.9, adult: Secondary | ICD-10-CM | POA: Diagnosis not present

## 2023-09-16 DIAGNOSIS — M5442 Lumbago with sciatica, left side: Secondary | ICD-10-CM | POA: Diagnosis not present

## 2023-09-16 DIAGNOSIS — Z9181 History of falling: Secondary | ICD-10-CM | POA: Diagnosis not present

## 2023-09-16 DIAGNOSIS — R300 Dysuria: Secondary | ICD-10-CM | POA: Diagnosis not present

## 2023-09-16 DIAGNOSIS — M62838 Other muscle spasm: Secondary | ICD-10-CM | POA: Diagnosis not present

## 2023-09-16 DIAGNOSIS — M546 Pain in thoracic spine: Secondary | ICD-10-CM | POA: Diagnosis not present

## 2023-09-16 DIAGNOSIS — Z6829 Body mass index (BMI) 29.0-29.9, adult: Secondary | ICD-10-CM | POA: Diagnosis not present

## 2023-09-16 DIAGNOSIS — M542 Cervicalgia: Secondary | ICD-10-CM | POA: Diagnosis not present

## 2023-09-17 NOTE — Telephone Encounter (Signed)
 Patient is calling in regarding her lab results. Please contact her and advise her on the labs. Patient can be reached at [phone]. Patient is about to be out of her medication and would like her lab results before she calls for a refill.

## 2023-09-18 ENCOUNTER — Ambulatory Visit: Payer: Self-pay

## 2023-09-18 ENCOUNTER — Ambulatory Visit (INDEPENDENT_AMBULATORY_CARE_PROVIDER_SITE_OTHER): Admitting: Emergency Medicine

## 2023-09-18 ENCOUNTER — Ambulatory Visit: Payer: Self-pay | Admitting: Emergency Medicine

## 2023-09-18 ENCOUNTER — Encounter: Payer: Self-pay | Admitting: Emergency Medicine

## 2023-09-18 ENCOUNTER — Ambulatory Visit (INDEPENDENT_AMBULATORY_CARE_PROVIDER_SITE_OTHER)

## 2023-09-18 VITALS — BP 124/78 | HR 73 | Temp 97.5°F | Ht 67.0 in | Wt 185.0 lb

## 2023-09-18 DIAGNOSIS — M7918 Myalgia, other site: Secondary | ICD-10-CM

## 2023-09-18 DIAGNOSIS — M940 Chondrocostal junction syndrome [Tietze]: Secondary | ICD-10-CM

## 2023-09-18 DIAGNOSIS — R3129 Other microscopic hematuria: Secondary | ICD-10-CM

## 2023-09-18 DIAGNOSIS — E039 Hypothyroidism, unspecified: Secondary | ICD-10-CM

## 2023-09-18 DIAGNOSIS — R918 Other nonspecific abnormal finding of lung field: Secondary | ICD-10-CM | POA: Diagnosis not present

## 2023-09-18 DIAGNOSIS — R911 Solitary pulmonary nodule: Secondary | ICD-10-CM | POA: Diagnosis not present

## 2023-09-18 DIAGNOSIS — M25511 Pain in right shoulder: Secondary | ICD-10-CM | POA: Diagnosis not present

## 2023-09-18 DIAGNOSIS — I10 Essential (primary) hypertension: Secondary | ICD-10-CM | POA: Diagnosis not present

## 2023-09-18 DIAGNOSIS — I771 Stricture of artery: Secondary | ICD-10-CM | POA: Diagnosis not present

## 2023-09-18 NOTE — Assessment & Plan Note (Signed)
 BP Readings from Last 3 Encounters:  09/18/23 124/78  03/15/23 134/86  03/05/23 124/78  Well-controlled hypertension Continue Hyzaar 50-12.5 mg daily

## 2023-09-18 NOTE — Patient Instructions (Signed)
 Costochondritis  Costochondritis is irritation and swelling (inflammation) of the tissue that connects the ribs to the breastbone (sternum). This tissue is called cartilage. This condition causes pain in the front of the chest. The pain often starts slowly. It may be in more than one rib. What are the causes? The cause of this condition is not always known. It can come from stress on the sternum. The cause of this stress could be: Chest injury. Exercise or activity. This may include lifting. Very bad coughing. What increases the risk? Being female. Being 25-56 years old. Starting a new exercise or work activity. Having low levels of vitamin D. Having a condition that makes you cough a lot. What are the signs or symptoms? Chest pain that: Starts slowly. It can be sharp or dull. Gets worse with deep breathing, coughing, or exercise. Gets better with rest. May be worse when you press on your ribs and breastbone. How is this treated? In most cases, this condition goes away on its own over time. You may need to take an NSAID, such as ibuprofen. This can help reduce pain. You may also need to: Rest and stay away from activities that make pain worse. Put heat or ice on the area that hurts. Do exercises to stretch your chest muscles. If these treatments do not help, your doctor may inject a medicine to numb the area. This can help relieve the pain. Follow these instructions at home: Managing pain, stiffness, and swelling     If told, put ice on the painful area. To do this: Put ice in a plastic bag. Place a towel between your skin and the bag. Leave the ice on for 20 minutes, 2-3 times a day. If told, put heat on the affected area. Do this as often as told by your doctor. Use the heat source that your doctor recommends, such as a moist heat pack or a heating pad. Place a towel between your skin and the heat source. Leave the heat on for 20-30 minutes. If your skin turns bright red,  take off the ice or heat right away to prevent skin damage. The risk of skin damage is higher if you cannot feel pain, heat, or cold. Activity Rest as told by your doctor. Do not do things that make your pain worse. This includes activities that use your chest, belly (abdomen), and side muscles. You may have to avoid lifting. Ask your doctor how much you can safely lift. Return to your normal activities when your doctor says that it is safe. General instructions Take over-the-counter and prescription medicines only as told by your doctor. Contact a doctor if: You have chills or a fever. Your pain does not go away or gets worse. You have a cough that does not go away. Get help right away if: You have a hard time breathing. You have very bad chest pain that does not get better with medicines, heat, or ice. These symptoms may be an emergency. Get help right away. Call 911. Do not wait to see if the symptoms will go away. Do not drive yourself to the hospital. This information is not intended to replace advice given to you by your health care provider. Make sure you discuss any questions you have with your health care provider. Document Revised: 09/15/2021 Document Reviewed: 09/15/2021 Elsevier Patient Education  2024 [employer].

## 2023-09-18 NOTE — Progress Notes (Signed)
 [name] 73 y.o.   Chief Complaint  Patient presents with   Hematuria    Patient here for because she had blood in her urine. She went to [facility] on 09/16/23 for the back pain she was to get a scan done but they xray was not working, She is still having sharpe throbbing back pain and shoulder to her side of right breast. They gave her a shot for the pain and was given gabapentin and Cyclobenzaprine but she has not started this     HISTORY OF PRESENT ILLNESS: This is a 73 y.o. female complaining of right scapular pain and right upper chest pain since June 23 Steady sharp pain worse with movement and not associated with any other symptoms. Went to [facility] last Sunday.  Spot urinalysis was positive for blood.  X-ray machine was down. Was given prescription for gabapentin and cyclobenzaprine but has not started it yet No other complaints or medical concerns today.  Hematuria Pertinent negatives include no abdominal pain, chills, dysuria, fever, flank pain, nausea or vomiting.     Prior to Admission medications   Medication Sig Start Date End Date Taking? Authorizing Provider  Ascorbic Acid  (VITAMIN C  PO) Take 1 tablet by mouth in the morning.    [provider]  B Complex-C (SUPER B COMPLEX PO) Take 1 tablet by mouth in the morning.    [provider]  carboxymethylcellulose (REFRESH PLUS) 0.5 % SOLN Place 1 drop into both eyes 3 (three) times daily as needed (dry/irritated eyes.).    [provider]  Cholecalciferol  (VITAMIN D -3 PO) Take 1 tablet by mouth in the morning.    [provider]  docusate sodium  (COLACE) 100 MG capsule Take 100 mg by mouth daily.    [provider]  DULoxetine  (CYMBALTA ) 60 MG capsule Take 1 capsule (60 mg total) by mouth daily. 04/02/23   Rollene Almarie LABOR, MD  fluticasone  (FLONASE ) 50 MCG/ACT nasal spray Place 2 sprays into both nostrils daily. Patient taking differently: Place 2 sprays  into both nostrils daily as needed (sinus issues.). 08/29/21   Rollene Almarie LABOR, MD  losartan -hydrochlorothiazide  (HYZAAR) 50-12.5 MG tablet Take 1 tablet by mouth daily. 03/15/23   Rollene Almarie LABOR, MD  Multiple Vitamins-Minerals (MULTIVITAMIN WITH MINERALS) tablet Take 1 tablet by mouth in the morning.    [provider]  neomycin -polymyxin-hydrocortisone  (CORTISPORIN) OTIC solution Place 3 drops into both ears 3 (three) times daily. 03/15/23   Rollene Almarie LABOR, MD  nystatin -triamcinolone  ointment (MYCOLOG) Apply 1 Application topically 2 (two) times daily. 03/15/23   Rollene Almarie LABOR, MD  ondansetron  (ZOFRAN ) 4 MG tablet Take 1 tablet (4 mg total) by mouth every 8 (eight) hours as needed for vomiting, refractory nausea / vomiting or nausea. 11/17/22 11/17/23  Kay Kemps, MD  pantoprazole  (PROTONIX ) 40 MG tablet Take 1 tablet (40 mg total) by mouth daily. 04/09/23   Rollene Almarie LABOR, MD  simvastatin  (ZOCOR ) 20 MG tablet Take 1 tablet (20 mg total) by mouth daily at 6 PM. 03/15/23   Rollene Almarie LABOR, MD  SYNTHROID  88 MCG tablet Take 88 mcg by mouth daily before breakfast. 01/14/20   [provider]  traZODone  (DESYREL ) 50 MG tablet TAKE 1 AND 1/2 TABLETS AT BEDTIME 08/07/23   Rollene Almarie LABOR, MD    No Known Allergies  Patient Active Problem List   Diagnosis Date Noted   Status post total knee replacement, right 11/17/2022   Lumbar radiculopathy 04/10/2022   Spinal  stenosis of lumbar region 04/06/2022   Liver hemangioma, right 01/29/2020   Generalized osteoarthritis 01/29/2020   Vitamin B12 deficiency anemia 01/29/2020   Chronic idiopathic constipation 01/29/2020   Routine general medical examination at a health care facility 03/07/2016   Vitamin D  deficiency 07/28/2015   Lipoma of arm 10/31/2014   Allergic rhinitis 08/24/2010   Eczema 08/24/2010   Essential hypertension 07/05/2010   Hypothyroidism 07/05/2010   Mixed hyperlipidemia 07/05/2010    Osteoarthritis 07/05/2010   Generalized anxiety disorder 07/05/2010   Genital herpes 07/05/2010   Impaired fasting glucose 07/05/2010   Persistent insomnia 07/05/2010   Ptosis of eyelid 07/05/2010   Unspecified cataract 07/05/2010   Major depressive disorder, recurrent episode (HCC) 07/05/2010    Past Medical History:  Diagnosis Date   Anxiety    Anxiety state 03/05/2023   Arthritis    Blood in stool    Colon polyps    Hyperlipidemia    Hypertension    Thyroid  disease     Past Surgical History:  Procedure Laterality Date   ABDOMINAL HYSTERECTOMY     KNEE SURGERY Left 2018   arthroscopy   TOTAL KNEE ARTHROPLASTY Right 11/17/2022   Procedure: TOTAL KNEE ARTHROPLASTY;  Surgeon: Kay Kemps, MD;  Location: [facility];  Service: Orthopedics;  Laterality: Right;    Social History   Socioeconomic History   Marital status: Married    Spouse name: Not on file   Number of children: Not on file   Years of education: Not on file   Highest education level: Not on file  Occupational History   Not on file  Tobacco Use   Smoking status: Never   Smokeless tobacco: Never  Vaping Use   Vaping status: Never Used  Substance and Sexual Activity   Alcohol use: No    Alcohol/week: 0.0 standard drinks of alcohol   Drug use: No   Sexual activity: Not on file  Other Topics Concern   Not on file  Social History Narrative   Not on file   Social Drivers of Health   Financial Resource Strain: Low Risk  (03/05/2023)   Overall Financial Resource Strain (CARDIA)    Difficulty of Paying Living Expenses: Not hard at all  Food Insecurity: Low Risk  (09/05/2023)   Received from [facility]   Hunger Vital Sign    Within the past 12 months, you worried that your food would run out before you got money to buy more: Never true    Within the past 12 months, the food you bought just didn't last and you didn't have money to get more. : Never true  Transportation Needs: No Transportation Needs  (09/05/2023)   Received from [facility]    In the past 12 months, has lack of reliable transportation kept you from medical appointments, meetings, work or from getting things needed for daily living? : No  Physical Activity: Sufficiently Active (03/05/2023)   Exercise Vital Sign    Days of Exercise per Week: 4 days    Minutes of Exercise per Session: 60 min  Stress: No Stress Concern Present (03/05/2023)   [employer] of Occupational Health - Occupational Stress Questionnaire    Feeling of Stress : Not at all  Social Connections: Socially Integrated (03/05/2023)   Social Connection and Isolation Panel    Frequency of Communication with Friends and Family: More than three times a week    Frequency of Social Gatherings with Friends and Family: More than three times  a week    Attends Religious Services: More than 4 times per year    Active Member of Clubs or Organizations: Yes    Attends Banker Meetings: More than 4 times per year    Marital Status: Married  Catering manager Violence: Not At Risk (03/05/2023)   Humiliation, Afraid, Rape, and Kick questionnaire    Fear of Current or Ex-Partner: No    Emotionally Abused: No    Physically Abused: No    Sexually Abused: No    Family History  Problem Relation Age of Onset   Hypertension Mother    Arthritis Sister    Cancer Sister        breast and colon   Diabetes Sister    Diabetes Paternal Aunt    Hypertension Paternal Grandmother      Review of Systems  Constitutional: Negative.  Negative for chills and fever.  HENT: Negative.  Negative for congestion and sore throat.   Respiratory: Negative.  Negative for cough and shortness of breath.   Cardiovascular: Negative.  Negative for chest pain and palpitations.  Gastrointestinal:  Negative for abdominal pain, diarrhea, nausea and vomiting.  Genitourinary:  Positive for hematuria. Negative for dysuria and flank pain.  Musculoskeletal:   Positive for back pain.  Skin: Negative.  Negative for rash.  Neurological:  Negative for dizziness and headaches.  All other systems reviewed and are negative.   Vitals:   09/18/23 1558  BP: 124/78  Pulse: 73  Temp: (!) 97.5 F (36.4 C)  SpO2: 95%     Physical Exam Vitals reviewed.  Constitutional:      Appearance: Normal appearance.  HENT:     Head: Normocephalic.     Mouth/Throat:     Mouth: Mucous membranes are moist.     Pharynx: Oropharynx is clear.  Eyes:     Extraocular Movements: Extraocular movements intact.     Pupils: Pupils are equal, round, and reactive to light.  Cardiovascular:     Rate and Rhythm: Normal rate and regular rhythm.     Pulses: Normal pulses.     Heart sounds: Normal heart sounds.  Pulmonary:     Effort: Pulmonary effort is normal.     Breath sounds: Normal breath sounds.  Chest:     Chest wall: Tenderness present.  Abdominal:     Palpations: Abdomen is soft.     Tenderness: There is no abdominal tenderness.  Musculoskeletal:     Cervical back: No tenderness.     Comments: Positive tenderness and muscle spasm to right scapular area  Lymphadenopathy:     Cervical: No cervical adenopathy.  Skin:    General: Skin is warm and dry.     Capillary Refill: Capillary refill takes less than 2 seconds.  Neurological:     General: No focal deficit present.     Mental Status: She is alert and oriented to person, place, and time.  Psychiatric:        Mood and Affect: Mood normal.        Behavior: Behavior normal.    DG Chest 2 View Result Date: 09/18/2023 CLINICAL DATA:  Right scapular pain EXAM: CHEST - 2 VIEW COMPARISON:  None available. FINDINGS: 8 mm nodular opacity in the right mid lung on the frontal radiograph. In the left lung base on the frontal radiograph, there are 2 adjacent nodules measuring 7 and 5 mm, respectively. No focal airspace consolidation, pleural effusion, or pneumothorax. No cardiomegaly. Tortuous aorta. No acute  fracture or destructive lesions. Multilevel thoracic osteophytosis. IMPRESSION: 1. No acute cardiopulmonary abnormality. 2. A couple of scattered lung nodules in the right mid lung and left lung base, possibly reflecting multiple calcified granulomas. Comparison with outside cross-sectional imaging is recommended to document stability. If none is available, a nonemergent CT of the chest should be considered for further characterization. Electronically Signed   By: Rogelia Myers M.D.   On: 09/18/2023 16:40     ASSESSMENT & PLAN: A total of 40 minutes was spent with the patient and counseling/coordination of care regarding preparing for this visit, review of most recent office visit notes, review of multiple chronic medical conditions under management, review of all medications, review of chest x-ray images done today, pain management, prognosis, documentation, and need for follow-up if no better or worse during the next several days or weeks.  Problem List Items Addressed This Visit       Cardiovascular and Mediastinum   Essential hypertension   BP Readings from Last 3 Encounters:  09/18/23 124/78  03/15/23 134/86  03/05/23 124/78  Well-controlled hypertension Continue Hyzaar 50-12.5 mg daily         Respiratory   Pulmonary nodule   Incidental finding on chest x-ray today Recommend evaluation of pulmonary nodule clinic Referral placed today.      Relevant Orders   AMB  Referral to Pulmonary Nodule Clinic     Endocrine   Hypothyroidism   Clinically euthyroid Continue Synthroid  88 mcg daily        Musculoskeletal and Integument   Costochondritis - Primary   Very tender on physical examination. Pain reproduced with mild palpation Pain management discussed Advised to take Tylenol  and or Advil as needed for pain Was given prescription for gabapentin and cyclobenzaprine at [facility].  Recommend to started Chest x-ray done today.  Will review images when ready       Relevant Orders   DG Chest 2 View (Completed)     Genitourinary   Microscopic hematuria   Recommend repeat urinalysis today Asymptomatic. CT scan from October 2023 unremarkable.  It was a CT renal stone study.  No kidney stones were identified      Relevant Orders   Urinalysis   CBC with Differential/Platelet   Comprehensive metabolic panel with GFR     Other   Musculoskeletal pain   Pain management discussed. Mechanical in nature and related to activities of daily living. Patient sleeps on her right side mostly       Relevant Orders   DG Chest 2 View (Completed)     Patient Instructions  Costochondritis  Costochondritis is irritation and swelling (inflammation) of the tissue that connects the ribs to the breastbone (sternum). This tissue is called cartilage. This condition causes pain in the front of the chest. The pain often starts slowly. It may be in more than one rib. What are the causes? The cause of this condition is not always known. It can come from stress on the sternum. The cause of this stress could be: Chest injury. Exercise or activity. This may include lifting. Very bad coughing. What increases the risk? Being female. Being 77-18 years old. Starting a new exercise or work activity. Having low levels of vitamin D . Having a condition that makes you cough a lot. What are the signs or symptoms? Chest pain that: Starts slowly. It can be sharp or dull. Gets worse with deep breathing, coughing, or exercise. Gets better with rest. May be worse when you press on  your ribs and breastbone. How is this treated? In most cases, this condition goes away on its own over time. You may need to take an NSAID, such as ibuprofen. This can help reduce pain. You may also need to: Rest and stay away from activities that make pain worse. Put heat or ice on the area that hurts. Do exercises to stretch your chest muscles. If these treatments do not help, your doctor may  inject a medicine to numb the area. This can help relieve the pain. Follow these instructions at home: Managing pain, stiffness, and swelling     If told, put ice on the painful area. To do this: Put ice in a plastic bag. Place a towel between your skin and the bag. Leave the ice on for 20 minutes, 2-3 times a day. If told, put heat on the affected area. Do this as often as told by your doctor. Use the heat source that your doctor recommends, such as a moist heat pack or a heating pad. Place a towel between your skin and the heat source. Leave the heat on for 20-30 minutes. If your skin turns bright red, take off the ice or heat right away to prevent skin damage. The risk of skin damage is higher if you cannot feel pain, heat, or cold. Activity Rest as told by your doctor. Do not do things that make your pain worse. This includes activities that use your chest, belly (abdomen), and side muscles. You may have to avoid lifting. Ask your doctor how much you can safely lift. Return to your normal activities when your doctor says that it is safe. General instructions Take over-the-counter and prescription medicines only as told by your doctor. Contact a doctor if: You have chills or a fever. Your pain does not go away or gets worse. You have a cough that does not go away. Get help right away if: You have a hard time breathing. You have very bad chest pain that does not get better with medicines, heat, or ice. These symptoms may be an emergency. Get help right away. Call 911. Do not wait to see if the symptoms will go away. Do not drive yourself to the hospital. This information is not intended to replace advice given to you by your health care provider. Make sure you discuss any questions you have with your health care provider. Document Revised: 09/15/2021 Document Reviewed: 09/15/2021 Elsevier Patient Education  2024 [employer]    Emil Schaumann, MD [facility] at  [facility]

## 2023-09-18 NOTE — Assessment & Plan Note (Signed)
 Incidental finding on chest x-ray today Recommend evaluation of pulmonary nodule clinic Referral placed today.

## 2023-09-18 NOTE — Assessment & Plan Note (Signed)
Clinically euthyroid.  Continue Synthroid 88 mcg daily.

## 2023-09-18 NOTE — Assessment & Plan Note (Signed)
 Recommend repeat urinalysis today Asymptomatic. CT scan from October 2023 unremarkable.  It was a CT renal stone study.  No kidney stones were identified

## 2023-09-18 NOTE — Assessment & Plan Note (Signed)
 Very tender on physical examination. Pain reproduced with mild palpation Pain management discussed Advised to take Tylenol  and or Advil as needed for pain Was given prescription for gabapentin and cyclobenzaprine at [facility].  Recommend to started Chest x-ray done today.  Will review images when ready

## 2023-09-18 NOTE — Telephone Encounter (Signed)
 FYI Only or Action Required?: FYI only for provider.  Patient was last seen in primary care on 03/15/2023 by Rollene Almarie LABOR, MD.  Called Nurse Triage reporting No chief complaint on file..  Symptoms began yesterday.  Interventions attempted: Prescription medications: Unsure.  Symptoms are: unchanged.  Triage Disposition: See Physician Within 24 Hours  Patient/caregiver understands and will follow disposition?: Yes   Copied from CRM [id number]. Topic: Clinical - Red Word Triage >> Sep 18, 2023  2:04 PM Thersia BROCKS wrote: [facility] that prompted transfer to Nurse Triage: Patient called in stated she was having some back pain went to the Urgent care yesterday but today she now has blood in her urine Reason for Disposition  Blood in urine (red, pink, or tea-colored)  Answer Assessment - Initial Assessment Questions 1. ONSET: When did the pain begin?      09-03-2023  2. LOCATION: Where does it hurt? (upper, mid or lower back)     Upper Back Pain, Right  3. SEVERITY: How bad is the pain?  (e.g., Scale 1-10; mild, moderate, or severe)   - MILD (1-3): Doesn't interfere with normal activities.    - MODERATE (4-7): Interferes with normal activities or awakens from sleep.    - SEVERE (8-10): Excruciating pain, unable to do any normal activities.      9  4. PATTERN: Is the pain constant? (e.g., yes, no; constant, intermittent)      Constant  5. RADIATION: Does the pain shoot into your legs or somewhere else?     Moves into the right breast  6. CAUSE:  What do you think is causing the back pain?      Unsure  7. BACK OVERUSE:  Any recent lifting of heavy objects, strenuous work or exercise?     No  8. MEDICINES: What have you taken so far for the pain? (e.g., nothing, acetaminophen , NSAIDS)      No  9. NEUROLOGIC SYMPTOMS: Do you have any weakness, numbness, or problems with bowel/bladder control?     No  10. OTHER SYMPTOMS: Do you have any other symptoms?  (e.g., fever, abdomen pain, burning with urination, blood in urine)       No  11. PREGNANCY: Is there any chance you are pregnant? When was your last menstrual period?       No and No  Went ot Urgent Care on yesterday, notified of blood in the urine.  Protocols used: Back Pain-A-AH

## 2023-09-18 NOTE — Assessment & Plan Note (Signed)
 Pain management discussed. Mechanical in nature and related to activities of daily living. Patient sleeps on her right side mostly

## 2023-09-19 LAB — URINALYSIS
Bilirubin Urine: NEGATIVE
Hgb urine dipstick: NEGATIVE
Ketones, ur: NEGATIVE
Leukocytes,Ua: NEGATIVE
Nitrite: NEGATIVE
Specific Gravity, Urine: 1.025 (ref 1.000–1.030)
Total Protein, Urine: NEGATIVE
Urine Glucose: NEGATIVE
Urobilinogen, UA: 0.2 (ref 0.0–1.0)
pH: 6 (ref 5.0–8.0)

## 2023-09-19 LAB — CBC WITH DIFFERENTIAL/PLATELET
Basophils Absolute: 0.1 K/uL (ref 0.0–0.1)
Basophils Relative: 1.1 % (ref 0.0–3.0)
Eosinophils Absolute: 0.1 K/uL (ref 0.0–0.7)
Eosinophils Relative: 2.2 % (ref 0.0–5.0)
HCT: 44 % (ref 36.0–46.0)
Hemoglobin: 14.4 g/dL (ref 12.0–15.0)
Lymphocytes Relative: 33.5 % (ref 12.0–46.0)
Lymphs Abs: 2.1 K/uL (ref 0.7–4.0)
MCHC: 32.6 g/dL (ref 30.0–36.0)
MCV: 90.5 fl (ref 78.0–100.0)
Monocytes Absolute: 0.7 K/uL (ref 0.1–1.0)
Monocytes Relative: 10.5 % (ref 3.0–12.0)
Neutro Abs: 3.4 K/uL (ref 1.4–7.7)
Neutrophils Relative %: 52.7 % (ref 43.0–77.0)
Platelets: 242 K/uL (ref 150.0–400.0)
RBC: 4.87 Mil/uL (ref 3.87–5.11)
RDW: 15 % (ref 11.5–15.5)
WBC: 6.4 K/uL (ref 4.0–10.5)

## 2023-09-19 LAB — COMPREHENSIVE METABOLIC PANEL WITH GFR
ALT: 10 U/L (ref 0–35)
AST: 22 U/L (ref 0–37)
Albumin: 4.2 g/dL (ref 3.5–5.2)
Alkaline Phosphatase: 69 U/L (ref 39–117)
BUN: 18 mg/dL (ref 6–23)
CO2: 30 meq/L (ref 19–32)
Calcium: 9.5 mg/dL (ref 8.4–10.5)
Chloride: 102 meq/L (ref 96–112)
Creatinine, Ser: 0.92 mg/dL (ref 0.40–1.20)
GFR: 61.79 mL/min (ref >60.00)
Glucose, Bld: 126 mg/dL — ABNORMAL HIGH (ref 70–99)
Potassium: 4 meq/L (ref 3.5–5.1)
Sodium: 139 meq/L (ref 135–145)
Total Bilirubin: 0.5 mg/dL (ref 0.2–1.2)
Total Protein: 7.6 g/dL (ref 6.0–8.3)

## 2023-09-20 ENCOUNTER — Telehealth: Payer: Self-pay | Admitting: Internal Medicine

## 2023-09-20 NOTE — Telephone Encounter (Signed)
 Spoke with patient and informed her of her xray results. She had been made aware that someone from pulmonary will be in touch to get an appointment scheduled

## 2023-09-20 NOTE — Telephone Encounter (Signed)
 Copied from CRM [id number]. Topic: Clinical - Lab/Test Results >> Sep 20, 2023 10:48 AM [name] wrote: Reason for CRM: Patient called in to get lab and x ray results. Advised patient the results for both test but she still had questions regarding the x ray. Please give her a call to discuss further questions.

## 2023-09-24 ENCOUNTER — Telehealth: Payer: Self-pay | Admitting: Internal Medicine

## 2023-09-24 DIAGNOSIS — R911 Solitary pulmonary nodule: Secondary | ICD-10-CM

## 2023-09-24 NOTE — Telephone Encounter (Unsigned)
 Copied from CRM [id number]. Topic: Referral - Question >> Sep 24, 2023 10:31 AM Macario HERO wrote: Reason for CRM: Patient is calling to leave a message with her provider regarding pain she's been experiencing since June 23. Patient stated the referral to see the pulmonary doctor is not until August 5th and she wanted to know if there is any way she can get a sooner appointment. Patient is requesting a call back.

## 2023-09-25 NOTE — Telephone Encounter (Signed)
 Gabapentin was prescribed to her this is what she has been taking to help with the pain. Pain is still the same. Pt was seen at [facility] where she was giving two shots in the buttock pt is taking a  gabapentin 100 mg  and cyclobenzaprine and pt states that taking both together makes her right arm numb

## 2023-09-25 NOTE — Telephone Encounter (Signed)
**Note De-identified  [place] Obfuscation** Please advise 

## 2023-09-25 NOTE — Telephone Encounter (Signed)
 I have ordered the CT scan of the chest which can be done before the lung doctor appointment. I am not sure about the pain since I did not see her what is she taking or tried for this pain and is it helping, worsening, improving etc?

## 2023-09-26 NOTE — Telephone Encounter (Signed)
 Called patient and relayed back the message and she verbalized she understood

## 2023-09-26 NOTE — Telephone Encounter (Signed)
 Would recommend to try tylenol  over the counter up to 1000 mg 3 times a day.

## 2023-09-27 ENCOUNTER — Other Ambulatory Visit

## 2023-09-27 ENCOUNTER — Inpatient Hospital Stay
Admission: RE | Admit: 2023-09-27 | Discharge: 2023-09-27 | Discharge status: Home / Self Care | Source: Ambulatory Visit | Attending: Internal Medicine | Admitting: Internal Medicine

## 2023-09-27 DIAGNOSIS — R911 Solitary pulmonary nodule: Secondary | ICD-10-CM

## 2023-09-27 DIAGNOSIS — R918 Other nonspecific abnormal finding of lung field: Secondary | ICD-10-CM | POA: Diagnosis not present

## 2023-10-03 ENCOUNTER — Ambulatory Visit: Admitting: Pulmonary Disease

## 2023-10-03 ENCOUNTER — Telehealth: Payer: Self-pay | Admitting: Internal Medicine

## 2023-10-03 NOTE — Telephone Encounter (Signed)
 Copied from CRM #[id number]. Topic: Clinical - Lab/Test Results >> Oct 03, 2023 11:14 AM Henretta I wrote: Reason for CRM: patient would like a call to relay and discuss CT results

## 2023-10-03 NOTE — Telephone Encounter (Signed)
 CT scans have not been read yet by a provider

## 2023-10-08 ENCOUNTER — Other Ambulatory Visit: Payer: Self-pay | Admitting: Internal Medicine

## 2023-10-08 ENCOUNTER — Telehealth: Payer: Self-pay | Admitting: Internal Medicine

## 2023-10-08 ENCOUNTER — Ambulatory Visit: Payer: Self-pay | Admitting: Internal Medicine

## 2023-10-08 DIAGNOSIS — R160 Hepatomegaly, not elsewhere classified: Secondary | ICD-10-CM

## 2023-10-08 NOTE — Telephone Encounter (Signed)
 Copied from CRM [id number]. Topic: General - Other >> Oct 08, 2023  9:40 AM Thersia BROCKS wrote: Reason for CRM: Pansy from [facility] called in wanting to speak with a nurse regarding a order change, please call back  [phone] ext  [phone]

## 2023-10-08 NOTE — Telephone Encounter (Signed)
 Copied from CRM #[id number]. Topic: General - Other >> Oct 08, 2023  3:09 PM Paige D wrote: Reason for CRM: Pansy from [facility] called in wanting to speak with a nurse regarding a order change, please call back ASAP  [phone] ext  220

## 2023-10-09 NOTE — Telephone Encounter (Signed)
 Copied from CRM [id number]. Topic: General - Other >> Oct 08, 2023  9:40 AM Thersia BROCKS wrote: Reason for CRM: Pansy from [facility] called in wanting to speak with a nurse regarding a order change, please call back  [phone] ext  [phone] >> Oct 09, 2023  8:31 AM Thersia BROCKS wrote: Shera is calling back regarding the order change for the patient, would like for someone to give her a callback regarding this . Patient has an appointment on Friday and needs to have the order changed before that

## 2023-10-10 ENCOUNTER — Other Ambulatory Visit: Payer: Self-pay

## 2023-10-10 NOTE — Telephone Encounter (Signed)
 Call from imaging center to adjust orders due to need to check Abdomen not Abd and pelvis for Liver Mass. Orders adjusted and sent to [employer] team, as patient has appt on Friday 8/1 for scan.

## 2023-10-12 ENCOUNTER — Other Ambulatory Visit

## 2023-10-12 ENCOUNTER — Ambulatory Visit
Admission: RE | Admit: 2023-10-12 | Discharge: 2023-10-12 | Disposition: A | Discharge status: Home / Self Care | Source: Ambulatory Visit | Attending: Internal Medicine | Admitting: Internal Medicine

## 2023-10-12 DIAGNOSIS — D1803 Hemangioma of intra-abdominal structures: Secondary | ICD-10-CM | POA: Diagnosis not present

## 2023-10-12 DIAGNOSIS — R160 Hepatomegaly, not elsewhere classified: Secondary | ICD-10-CM

## 2023-10-12 MED ORDER — IOPAMIDOL (ISOVUE-300) INJECTION 61%
500.0000 mL | Freq: Once | INTRAVENOUS | Status: AC | PRN
  Administered 2023-10-12: 100 mL via INTRAVENOUS

## 2023-10-16 ENCOUNTER — Encounter: Payer: Self-pay | Admitting: Acute Care

## 2023-10-16 ENCOUNTER — Ambulatory Visit: Payer: Self-pay | Admitting: Internal Medicine

## 2023-10-16 ENCOUNTER — Ambulatory Visit (INDEPENDENT_AMBULATORY_CARE_PROVIDER_SITE_OTHER): Admitting: Acute Care

## 2023-10-16 VITALS — BP 123/74 | HR 68 | Temp 97.7°F | Ht 67.0 in | Wt 185.2 lb

## 2023-10-16 DIAGNOSIS — Z803 Family history of malignant neoplasm of breast: Secondary | ICD-10-CM | POA: Diagnosis not present

## 2023-10-16 DIAGNOSIS — Z800 Family history of malignant neoplasm of digestive organs: Secondary | ICD-10-CM | POA: Diagnosis not present

## 2023-10-16 DIAGNOSIS — R918 Other nonspecific abnormal finding of lung field: Secondary | ICD-10-CM

## 2023-10-16 DIAGNOSIS — R9389 Abnormal findings on diagnostic imaging of other specified body structures: Secondary | ICD-10-CM

## 2023-10-16 DIAGNOSIS — R911 Solitary pulmonary nodule: Secondary | ICD-10-CM

## 2023-10-16 NOTE — Telephone Encounter (Signed)
 Called and spoke with patient. The abdomen scan does show a spot in the liver which is a collection of blood vessels (hemangioma) which is the same as 2021. This is not a cancer and will not grow into a cancer. No other spots in the stomach/abdomen and all organs look normal. We also discussed the lung scan in more detail including the recommendation since we know the liver spot is not cancer of follow up scan in 3 months, pet ct now, lung sampling now. She has apt with pulmonary this morning and will review options with them.

## 2023-10-16 NOTE — Progress Notes (Signed)
 History of Present Illness [name] is a 73 y.o. female never smoker referred August 2025 to see Dr. Shelah regarding abnormal chest imaging.  She   10/16/2023 Discussed the use of AI scribe software for clinical note transcription with the patient, who gave verbal consent to proceed.  History of Present Illness [name] is a 73 year old female who presents with multiple lung nodules found on imaging. She was referred by her primary care doctor for evaluation of lung nodules found on imaging.  Initial chest x-ray led to a CT scan revealing several solid lung nodules. A mass in the liver is also present and has been evaluated with a CT abdomen by her primary care physician.  She has no history of cancer and denies weight loss, hemoptysis, or shortness of breath. There is a family history of breast and colon cancer in her sisters.  Her past medical history includes hypertension, hyperlipidemia, knee replacement, hysterectomy, and Graves' disease with ocular involvement. She smoked in her youth but quit many years ago. She attributes weight gain to her thyroid  condition.  Patient is retired she does not have children she has a family history of heart disease and cancer as noted above. She does have a history of anxiety  We have discussed next best steps moving forward regarding her chest imaging and lung nodules.  Plan will be for a PET scan to further evaluate lung nodules as well as the liver mass.  She is in agreement with this plan.  She will follow-up with me 1 to 2 weeks after the scan has been done to review the results and determine next best steps in plan of care.     Test Results: CT chest without contrast 09/27/2023 1.5 x 0.7 cm subpleural right lower lobe nodule. This is mixed attenuation but the presence of macroscopic fat could not be confirmed. Its shape suggests potentially a chronically impacted peripheral airway but this too could not be confirmed. If the  observed liver abnormality turns out to be a primary or metastatic neoplasm, these nodules must be considered suspicious for metastases. Otherwise, 3 month follow-up CT, PET-CT or tissue sampling would be the typical recommendation. 3. Multiple other smaller nodules in both lungs, largest 5 mm. 4. Aortic and coronary artery atherosclerosis. 5. 4 x 3.1 cm hypodense mass in segment 5 of the liver is suspected on the last 3 images of the study. CT abdomen and pelvis, preferably with IV contrast if the patient can tolerate contrast, is recommended as quickly as possible.  10/12/2023 CT abdomen with and without contrast Stable (since CT scan 2021) large segment 5 hepatic hemangioma. No worrisome hepatic lesions or intrahepatic biliary dilatation. 2. Small bilateral pulmonary nodules as demonstrated on the recent chest CT scan and worrisome for pulmonary metastatic disease. Some of these nodules could be seen in the lung bases on the CT scan of the abdomen from 12/14/2021 but several are new and most were not covered. 3. No acute abdominal findings, mass lesions or adenopathy.         Latest Ref Rng & Units 09/18/2023    4:25 PM 03/15/2023   10:06 AM 11/08/2022   10:28 AM  CBC  WBC 4.0 - 10.5 K/uL 6.4  4.6  5.3   Hemoglobin 12.0 - 15.0 g/dL 85.5  87.0  86.1   Hematocrit 36.0 - 46.0 % 44.0  39.6  42.6   Platelets 150.0 - 400.0 K/uL 242.0  273.0  251  Latest Ref Rng & Units 09/18/2023    4:25 PM 03/15/2023   10:06 AM 11/08/2022   10:28 AM  BMP  Glucose 70 - 99 mg/dL 873  98  894   BUN 6 - 23 mg/dL 18  16  17    Creatinine 0.40 - 1.20 mg/dL 9.07  9.31  9.15   Sodium 135 - 145 mEq/L 139  141  138   Potassium 3.5 - 5.1 mEq/L 4.0  3.9  4.1   Chloride 96 - 112 mEq/L 102  108  105   CO2 19 - 32 mEq/L 30  26  26    Calcium 8.4 - 10.5 mg/dL 9.5  9.0  9.3     BNP No results found for: BNP  ProBNP    Component Value Date/Time   PROBNP 27.0 06/12/2017 1053    PFT No results found  for: FEV1PRE, FEV1POST, FVCPRE, FVCPOST, TLC, DLCOUNC, PREFEV1FVCRT, PSTFEV1FVCRT  CT ABDOMEN W WO CONTRAST Result Date: 10/15/2023 CLINICAL DATA:  Evaluate liver lesions seen 1 chest CT 09/27/2023 EXAM: CT ABDOMEN WITHOUT AND WITH CONTRAST TECHNIQUE: Multidetector CT imaging of the abdomen was performed following the standard protocol before and following the bolus administration of intravenous contrast. RADIATION DOSE REDUCTION: This exam was performed according to the departmental dose-optimization program which includes automated exposure control, adjustment of the mA and/or kV according to patient size and/or use of iterative reconstruction technique. CONTRAST:  100mL ISOVUE -300 IOPAMIDOL  (ISOVUE -300) INJECTION 61% COMPARISON:  CT scan 01/29/2020 FINDINGS: Lower chest: Small bilateral pulmonary nodules as demonstrated on the recent CT scan and worrisome for pulmonary metastatic disease. The heart is normal in size. No pericardial effusion. No pleural effusion. Hepatobiliary: Stable large segment 5 hemangioma. No change since a CT scan from 2021. No worrisome hepatic lesions or intrahepatic biliary dilatation. The gallbladder is unremarkable. No common bile duct dilatation. Pancreas: No mass, inflammation or ductal dilatation. Spleen: Normal size. No focal lesions. Small accessory spleen nearby. Adrenals/Urinary Tract: The adrenal glands and kidneys are normal. Stomach/Bowel: The stomach, duodenum, visualized small bowel and visualized colon are unremarkable. Vascular/Lymphatic: The aorta and branch vessels are patent. Minimal scattered atherosclerotic calcifications. The branch vessels are patent. The major venous structures are patent. No mesenteric or retroperitoneal mass or adenopathy. Other: No ascites or abdominal wall hernia. Musculoskeletal: No significant bony findings. IMPRESSION: 1. Stable (since CT scan 2021) large segment 5 hepatic hemangioma. No worrisome hepatic lesions or  intrahepatic biliary dilatation. 2. Small bilateral pulmonary nodules as demonstrated on the recent chest CT scan and worrisome for pulmonary metastatic disease. Some of these nodules could be seen in the lung bases on the CT scan of the abdomen from 12/14/2021 but several are new and most were not covered. 3. No acute abdominal findings, mass lesions or adenopathy. Electronically Signed   By: MYRTIS Stammer M.D.   On: 10/15/2023 23:03   CT Chest Wo Contrast Result Date: 10/03/2023 CLINICAL DATA:  On recent chest x-ray with 8 mm right mid field nodule, adjacent 7 mm and 5 mm left base nodules. Presents for CT evaluation. EXAM: CT CHEST WITHOUT CONTRAST TECHNIQUE: Multidetector CT imaging of the chest was performed following the standard protocol without IV contrast. RADIATION DOSE REDUCTION: This exam was performed according to the departmental dose-optimization program which includes automated exposure control, adjustment of the mA and/or kV according to patient size and/or use of iterative reconstruction technique. COMPARISON:  Only comparison study is PA and lateral chest 09/18/2023. FINDINGS: Cardiovascular: There are mild single-vessel calcifications in  the proximal LAD coronary artery. No pericardial effusion. The cardiac size is normal. Pulmonary arteries and veins are normal in caliber. There is mild aortic tortuosity, small amount of atherosclerosis in the descending aorta. There is no aortic aneurysm. There is tortuosity of the great vessels without visible calcific plaques. Mediastinum/Nodes: No enlarged mediastinal or axillary lymph nodes. The atrophic thyroid  gland, the trachea, and esophagus demonstrate no significant findings. Evaluation for hilar adenopathy is limited without contrast. Lungs/Pleura: Solid nodules are confirmed. The largest nodule is a roughly C-shaped slightly lobular well-circumscribed subpleural right lower lobe nodule on 8:62 measuring 1.5 x 0.7 cm. This nodule is mixed  attenuation but the presence of macroscopic fat could not be confirmed. Its shape suggests potentially a chronically impacted peripheral airway but this too could not be confirmed. Further down in the right middle lobe, there is a 3 mm subpleural nodule on axial image 71, a 4 mm ground-glass nodule on 8:73, a 3 mm nodule on 8:82. In the right middle lobe there are 2-3 mm nodules on images 63 and 65 and a 4 mm nodule on 8:87. There are several tiny nodules in the right upper lobe in the 2-3 mm range, for example on axial images 28, 35, 43, 44 and 48, and a 4 mm nodule anteriorly on 8:39. In the left upper lobe there are tiny 2-3 mm nodules on images 22, 26, 37, 45, and a left lower lobe 3 mm nodule on image 80. In the left lower lobe there is a 5 mm nodule on 8:88, 4 mm nodule on this same image, 4 mm nodule on 8:93, 4 mm nodule on 8:104. There is scattered linear scarring or atelectasis in both lung bases, no further appreciable nodules and no consolidation, effusion or pneumothorax. There is mild reticulated scarring at the lung apices. No evidence of interstitial lung disease. Upper Abdomen: On the last 3 images of the study, there is suspected to be a hypodense 4 x 3.1 cm mass in segment 5 of the liver. CT abdomen and pelvis, preferably with IV contrast is recommended as soon as possible. Rest of the unenhanced liver is homogeneous. There is no adrenal mass. No acute upper abdominal findings. Musculoskeletal: No destructive bone lesion is seen. There is mild thoracic spondylosis and kyphodextroscoliosis. The chest wall is unremarkable. IMPRESSION: 1. 1.5 x 0.7 cm subpleural right lower lobe nodule. This is mixed attenuation but the presence of macroscopic fat could not be confirmed. Its shape suggests potentially a chronically impacted peripheral airway but this too could not be confirmed. 2. The usual Fleischner guidelines do not apply to cancer patients. If the observed liver abnormality turns out to be a  primary or metastatic neoplasm, these nodules must be considered suspicious for metastases. Otherwise, 3 month follow-up CT, PET-CT or tissue sampling would be the typical recommendation. 3. Multiple other smaller nodules in both lungs, largest 5 mm. 4. Aortic and coronary artery atherosclerosis. 5. 4 x 3.1 cm hypodense mass in segment 5 of the liver is suspected on the last 3 images of the study. CT abdomen and pelvis, preferably with IV contrast if the patient can tolerate contrast, is recommended as quickly as possible. 6. Kyphodextroscoliosis and spondylosis. Aortic Atherosclerosis (ICD10-I70.0). Electronically Signed   By: Francis Quam M.D.   On: 10/03/2023 22:11   DG Chest 2 View Result Date: 09/18/2023 CLINICAL DATA:  Right scapular pain EXAM: CHEST - 2 VIEW COMPARISON:  None available. FINDINGS: 8 mm nodular opacity in the right mid lung  on the frontal radiograph. In the left lung base on the frontal radiograph, there are 2 adjacent nodules measuring 7 and 5 mm, respectively. No focal airspace consolidation, pleural effusion, or pneumothorax. No cardiomegaly. Tortuous aorta. No acute fracture or destructive lesions. Multilevel thoracic osteophytosis. IMPRESSION: 1. No acute cardiopulmonary abnormality. 2. A couple of scattered lung nodules in the right mid lung and left lung base, possibly reflecting multiple calcified granulomas. Comparison with outside cross-sectional imaging is recommended to document stability. If none is available, a nonemergent CT of the chest should be considered for further characterization. Electronically Signed   By: Rogelia Myers M.D.   On: 09/18/2023 16:40     Past medical hx Past Medical History:  Diagnosis Date   Anxiety    Anxiety state 03/05/2023   Arthritis    Blood in stool    Colon polyps    Hyperlipidemia    Hypertension    Thyroid  disease      Social History   Tobacco Use   Smoking status: Never    Passive exposure: Never   Smokeless tobacco:  Never  Vaping Use   Vaping status: Never Used  Substance Use Topics   Alcohol use: No    Alcohol/week: 0.0 standard drinks of alcohol   Drug use: No    Ms.Braid reports that she has never smoked. She has never been exposed to tobacco smoke. She has never used smokeless tobacco. She reports that she does not drink alcohol and does not use drugs.  Tobacco Cessation: Counseling given: Not Answered Patient is a never smoker  Past surgical hx, Family hx, Social hx all reviewed.  Current Outpatient Medications on File Prior to Visit  Medication Sig   Ascorbic Acid  (VITAMIN C  PO) Take 1 tablet by mouth in the morning.   B Complex-C (SUPER B COMPLEX PO) Take 1 tablet by mouth in the morning.   carboxymethylcellulose (REFRESH PLUS) 0.5 % SOLN Place 1 drop into both eyes 3 (three) times daily as needed (dry/irritated eyes.).   Cholecalciferol  (VITAMIN D -3 PO) Take 1 tablet by mouth in the morning.   docusate sodium  (COLACE) 100 MG capsule Take 100 mg by mouth daily.   DULoxetine  (CYMBALTA ) 60 MG capsule Take 1 capsule (60 mg total) by mouth daily.   fluticasone  (FLONASE ) 50 MCG/ACT nasal spray Place 2 sprays into both nostrils daily.   losartan -hydrochlorothiazide  (HYZAAR) 50-12.5 MG tablet Take 1 tablet by mouth daily.   Multiple Vitamins-Minerals (MULTIVITAMIN WITH MINERALS) tablet Take 1 tablet by mouth in the morning.   neomycin -polymyxin-hydrocortisone  (CORTISPORIN) OTIC solution Place 3 drops into both ears 3 (three) times daily.   simvastatin  (ZOCOR ) 20 MG tablet Take 1 tablet (20 mg total) by mouth daily at 6 PM.   SYNTHROID  88 MCG tablet Take 88 mcg by mouth daily before breakfast.   traZODone  (DESYREL ) 50 MG tablet TAKE 1 AND 1/2 TABLETS AT BEDTIME   nystatin -triamcinolone  ointment (MYCOLOG) Apply 1 Application topically 2 (two) times daily. (Patient not taking: Reported on 10/16/2023)   ondansetron  (ZOFRAN ) 4 MG tablet Take 1 tablet (4 mg total) by mouth every 8 (eight) hours as  needed for vomiting, refractory nausea / vomiting or nausea. (Patient not taking: Reported on 10/16/2023)   pantoprazole  (PROTONIX ) 40 MG tablet Take 1 tablet (40 mg total) by mouth daily. (Patient not taking: Reported on 10/16/2023)   No current facility-administered medications on file prior to visit.     No Known Allergies  Review Of Systems:  Constitutional:   No  weight loss, night sweats,  Fevers, chills, fatigue, or  lassitude.  HEENT:   No headaches,  Difficulty swallowing,  Tooth/dental problems, or  Sore throat,                No sneezing, itching, ear ache, nasal congestion, post nasal drip,   CV:  No chest pain,  Orthopnea, PND, swelling in lower extremities, anasarca, dizziness, palpitations, syncope.   GI  No heartburn, indigestion, abdominal pain, nausea, vomiting, diarrhea, change in bowel habits, loss of appetite, bloody stools.   Resp: No shortness of breath with exertion or at rest.  No excess mucus, no productive cough,  No non-productive cough,  No coughing up of blood.  No change in color of mucus.  No wheezing.  No chest wall deformity  Skin: no rash or lesions.  GU: no dysuria, change in color of urine, no urgency or frequency.  No flank pain, no hematuria   MS:  No joint pain or swelling.  No decreased range of motion.  No back pain.  Psych:  No change in mood or affect. No depression or anxiety.  No memory loss.   Vital Signs BP 123/74   Pulse 68   Temp 97.7 F (36.5 C) (Oral)   Ht 5' 7 (1.702 m)   Wt 185 lb 3.2 oz (84 kg)   SpO2 98%   BMI 29.01 kg/m    Physical Exam:  General- No distress,  A&Ox3, pleasant and appropriate ENT: No sinus tenderness, TM clear, pale nasal mucosa, no oral exudate,no post nasal drip, no LAN Cardiac: S1, S2, regular rate and rhythm, no murmur Chest: No wheeze/ rales/ dullness; no accessory muscle use, no nasal flaring, no sternal retractions Abd.: Soft Non-tender, nondistended, bowel sounds positive,Body mass index is  29.01 kg/m.  Ext: No clubbing cyanosis, edema, no obvious deformities Neuro:  normal strength, moving all extremities x 4 , alert and oriented x 3 Skin: No rashes, warm and dry, no obvious skin lesions Psych: normal mood and behavior   Assessment/Plan  Assessment and Plan Assessment & Plan Multiple pulmonary nodules Differential includes benign causes and potential malignancy.  Never smoker Family history of breast and colon cancer in sisters.  Plan - Order PET scan to evaluate nodules for metabolic activity and potential malignancy. - Schedule follow-up appointment to review PET scan results.  Liver mass under evaluation Hypodense liver mass potentially obscured by overlapping lobes. Differential includes benign and malignant causes. Primary care physician does not suspect malignancy. - Include liver in PET scan to assess for metabolic activity. - Evaluate PET scan results to determine need for further investigation, such as biopsy.  Pruritic skin lesion, left foot Intermittent pruritic lesion on the left foot.  Possible differential includes eczema or other dermatological conditions/sarcoidosis. Plan - Recommend trial of cortisone cream to alleviate itching.  I spent 25 minutes dedicated to the care of this patient on the date of this encounter to include pre-visit review of records, face-to-face time with the patient discussing conditions above, post visit ordering of testing, clinical documentation with the electronic health record, making appropriate referrals as documented, and communicating necessary information to the patient's healthcare team.       Lauraine JULIANNA Lites, NP 10/16/2023  9:45 AM

## 2023-10-16 NOTE — Patient Instructions (Addendum)
 It is good to see you today. We have reviewed your CT Chest. There are several pulmonary nodules and an area on your liver we need to take a closer look at.  I have ordered a PET scan to better evaluate both. You will get a call to get it ordered. Follow up with me 1-2 weeks after PET to review results and determine next best steps in plan of care.  Please contact office for sooner follow up if symptoms do not improve or worsen or seek emergency care

## 2023-10-23 ENCOUNTER — Other Ambulatory Visit (HOSPITAL_COMMUNITY)

## 2023-11-01 ENCOUNTER — Ambulatory Visit (HOSPITAL_COMMUNITY)
Admission: RE | Admit: 2023-11-01 | Discharge: 2023-11-01 | Disposition: A | Discharge status: Home / Self Care | Source: Ambulatory Visit | Attending: Acute Care | Admitting: Acute Care

## 2023-11-01 DIAGNOSIS — R911 Solitary pulmonary nodule: Secondary | ICD-10-CM | POA: Insufficient documentation

## 2023-11-01 LAB — GLUCOSE, CAPILLARY: Glucose-Capillary: 93 mg/dL (ref 70–99)

## 2023-11-01 MED ORDER — FLUDEOXYGLUCOSE F - 18 (FDG) INJECTION
9.2000 | Freq: Once | INTRAVENOUS | Status: AC | PRN
  Administered 2023-11-01: 9.2 via INTRAVENOUS

## 2023-11-06 ENCOUNTER — Encounter: Payer: Self-pay | Admitting: Acute Care

## 2023-11-06 ENCOUNTER — Ambulatory Visit (INDEPENDENT_AMBULATORY_CARE_PROVIDER_SITE_OTHER): Admitting: Acute Care

## 2023-11-06 VITALS — BP 111/71 | HR 75 | Temp 98.0°F | Ht 67.0 in | Wt 186.6 lb

## 2023-11-06 DIAGNOSIS — R911 Solitary pulmonary nodule: Secondary | ICD-10-CM

## 2023-11-06 DIAGNOSIS — R9389 Abnormal findings on diagnostic imaging of other specified body structures: Secondary | ICD-10-CM

## 2023-11-06 NOTE — Progress Notes (Signed)
 History of Present Illness [name] is a 73 y.o. female never smoker referred August 2025 to see Dr. Shelah regarding abnormal chest imaging.   PMH Past medical history includes hypertension, hyperlipidemia, knee replacement, hysterectomy, and Graves' disease with ocular involvement. She smoked in her youth but quit many years ago.    11/06/2023 Discussed the use of AI scribe software for clinical note transcription with the patient, who gave verbal consent to proceed.  History of Present Illness [name] is a 73 year old female never smoker who presents for follow-up of a lung nodule noted on chest imaging.  She has a 15 mm lobular nodule in the right lower lobe of the lung, identified on a CT chest performed on September 27, 2023. A mass in the liver is also present on that scan , and has been evaluated with a CT abdomen on 10/12/2023  by her primary care physician. The CT Abdomen showed no abnormalities of the liver.  Pt was seen by Pulmonary 10/16/2023 for pulmonary nodule consult. At that time we decided to do a PET scan to closely evaluate the right lower lobe pulmonary nodule for malignancy.   The PET scan was done 11/01/2023, and it  showed the lobular nodule in the superior segment of the RIGHT lower lobe favors benign etiology or indolent neoplasm. There was no abnormal hypermetabolic activity within the liver, pancreas, adrenals, or spleen, and no hypermetabolic lymph nodes in the abdomen or pelvis.  Family history is negative for autoimmune diseases such as sarcoidosis, lupus, or rheumatoid arthritis. There is a mention of 'regular arthritis' in the family, but no psoriasis or rashes that come and go.  Plan will be for a short term follow up Ct chest in 3 months as continued surveillance . I have asked her to call to be seen sooner for any hemoptysis or unexplained weight loss. She denies any respiratory symptoms, and states her breathing has been stable.     Test  Results: PET 11/01/2023 IMPRESSION: 1. No metabolic activity associated with lobular nodule in the superior segment of the RIGHT lower lobe. Findings favor benign etiology or indolent neoplasm. 2. No evidence of metastatic disease.  CT chest without contrast 09/27/2023 1.5 x 0.7 cm subpleural right lower lobe nodule. This is mixed attenuation but the presence of macroscopic fat could not be confirmed. Its shape suggests potentially a chronically impacted peripheral airway but this too could not be confirmed. If the observed liver abnormality turns out to be a primary or metastatic neoplasm, these nodules must be considered suspicious for metastases. Otherwise, 3 month follow-up CT, PET-CT or tissue sampling would be the typical recommendation. 3. Multiple other smaller nodules in both lungs, largest 5 mm. 4. Aortic and coronary artery atherosclerosis. 5. 4 x 3.1 cm hypodense mass in segment 5 of the liver is suspected on the last 3 images of the study. CT abdomen and pelvis, preferably with IV contrast if the patient can tolerate contrast, is recommended as quickly as possible.   10/12/2023 CT abdomen with and without contrast Stable (since CT scan 2021) large segment 5 hepatic hemangioma. No worrisome hepatic lesions or intrahepatic biliary dilatation. 2. Small bilateral pulmonary nodules as demonstrated on the recent chest CT scan and worrisome for pulmonary metastatic disease. Some of these nodules could be seen in the lung bases on the CT scan of the abdomen from 12/14/2021 but several are new and most were not covered. 3. No acute abdominal findings, mass lesions or  adenopathy.         Latest Ref Rng & Units 09/18/2023    4:25 PM 03/15/2023   10:06 AM 11/08/2022   10:28 AM  CBC  WBC 4.0 - 10.5 K/uL 6.4  4.6  5.3   Hemoglobin 12.0 - 15.0 g/dL 85.5  87.0  86.1   Hematocrit 36.0 - 46.0 % 44.0  39.6  42.6   Platelets 150.0 - 400.0 K/uL 242.0  273.0  251        Latest Ref Rng &  Units 09/18/2023    4:25 PM 03/15/2023   10:06 AM 11/08/2022   10:28 AM  BMP  Glucose 70 - 99 mg/dL 873  98  894   BUN 6 - 23 mg/dL 18  16  17    Creatinine 0.40 - 1.20 mg/dL 9.07  9.31  9.15   Sodium 135 - 145 mEq/L 139  141  138   Potassium 3.5 - 5.1 mEq/L 4.0  3.9  4.1   Chloride 96 - 112 mEq/L 102  108  105   CO2 19 - 32 mEq/L 30  26  26    Calcium 8.4 - 10.5 mg/dL 9.5  9.0  9.3     BNP No results found for: BNP  ProBNP    Component Value Date/Time   PROBNP 27.0 06/12/2017 1053    PFT No results found for: FEV1PRE, FEV1POST, FVCPRE, FVCPOST, TLC, DLCOUNC, PREFEV1FVCRT, PSTFEV1FVCRT  NM PET Image Initial (PI) Skull Base To Thigh Result Date: 11/05/2023 CLINICAL DATA:  Subsequent treatment strategy for pulmonary nodule. EXAM: NUCLEAR MEDICINE PET SKULL BASE TO THIGH TECHNIQUE: 9.2 mCi F-18 FDG was injected intravenously. Full-ring PET imaging was performed from the skull base to thigh after the radiotracer. CT data was obtained and used for attenuation correction and anatomic localization. Fasting blood glucose: 93 mg/dl COMPARISON:  CT 93/82/7974 FINDINGS: NECK: No hypermetabolic lymph nodes in the neck. Incidental CT findings: None. CHEST: I persistent nodule in the superior segment of the RIGHT lower lobe measures 15 mm on image 68/4. This nodule is lobular with smooth margins. The nodule does not have metabolic activity. No hypermetabolic mediastinal lymph nodes. Incidental CT findings: None. ABDOMEN/PELVIS: No abnormal hypermetabolic activity within the liver, pancreas, adrenal glands, or spleen. No hypermetabolic lymph nodes in the abdomen or pelvis. Incidental CT findings: None. SKELETON: No focal hypermetabolic activity to suggest skeletal metastasis. Incidental CT findings: None. IMPRESSION: 1. No metabolic activity associated with lobular nodule in the superior segment of the RIGHT lower lobe. Findings favor benign etiology or indolent neoplasm. 2. No evidence of  metastatic disease. Electronically Signed   By: Jackquline Boxer M.D.   On: 11/05/2023 16:33   CT ABDOMEN W WO CONTRAST Result Date: 10/15/2023 CLINICAL DATA:  Evaluate liver lesions seen 1 chest CT 09/27/2023 EXAM: CT ABDOMEN WITHOUT AND WITH CONTRAST TECHNIQUE: Multidetector CT imaging of the abdomen was performed following the standard protocol before and following the bolus administration of intravenous contrast. RADIATION DOSE REDUCTION: This exam was performed according to the departmental dose-optimization program which includes automated exposure control, adjustment of the mA and/or kV according to patient size and/or use of iterative reconstruction technique. CONTRAST:  100mL ISOVUE -300 IOPAMIDOL  (ISOVUE -300) INJECTION 61% COMPARISON:  CT scan 01/29/2020 FINDINGS: Lower chest: Small bilateral pulmonary nodules as demonstrated on the recent CT scan and worrisome for pulmonary metastatic disease. The heart is normal in size. No pericardial effusion. No pleural effusion. Hepatobiliary: Stable large segment 5 hemangioma. No change since a CT scan from  2021. No worrisome hepatic lesions or intrahepatic biliary dilatation. The gallbladder is unremarkable. No common bile duct dilatation. Pancreas: No mass, inflammation or ductal dilatation. Spleen: Normal size. No focal lesions. Small accessory spleen nearby. Adrenals/Urinary Tract: The adrenal glands and kidneys are normal. Stomach/Bowel: The stomach, duodenum, visualized small bowel and visualized colon are unremarkable. Vascular/Lymphatic: The aorta and branch vessels are patent. Minimal scattered atherosclerotic calcifications. The branch vessels are patent. The major venous structures are patent. No mesenteric or retroperitoneal mass or adenopathy. Other: No ascites or abdominal wall hernia. Musculoskeletal: No significant bony findings. IMPRESSION: 1. Stable (since CT scan 2021) large segment 5 hepatic hemangioma. No worrisome hepatic lesions or  intrahepatic biliary dilatation. 2. Small bilateral pulmonary nodules as demonstrated on the recent chest CT scan and worrisome for pulmonary metastatic disease. Some of these nodules could be seen in the lung bases on the CT scan of the abdomen from 12/14/2021 but several are new and most were not covered. 3. No acute abdominal findings, mass lesions or adenopathy. Electronically Signed   By: MYRTIS Stammer M.D.   On: 10/15/2023 23:03     Past medical hx Past Medical History:  Diagnosis Date   Anxiety    Anxiety state 03/05/2023   Arthritis    Blood in stool    Colon polyps    Hyperlipidemia    Hypertension    Thyroid  disease      Social History   Tobacco Use   Smoking status: Never    Passive exposure: Never   Smokeless tobacco: Never  Vaping Use   Vaping status: Never Used  Substance Use Topics   Alcohol use: No    Alcohol/week: 0.0 standard drinks of alcohol   Drug use: No    [name] reports that she has never smoked. She has never been exposed to tobacco smoke. She has never used smokeless tobacco. She reports that she does not drink alcohol and does not use drugs.  Tobacco Cessation: Counseling given: Not Answered Never smoker   Past surgical hx, Family hx, Social hx all reviewed.  Current Outpatient Medications on File Prior to Visit  Medication Sig   Ascorbic Acid  (VITAMIN C  PO) Take 1 tablet by mouth in the morning.   B Complex-C (SUPER B COMPLEX PO) Take 1 tablet by mouth in the morning.   carboxymethylcellulose (REFRESH PLUS) 0.5 % SOLN Place 1 drop into both eyes 3 (three) times daily as needed (dry/irritated eyes.).   Cholecalciferol  (VITAMIN D -3 PO) Take 1 tablet by mouth in the morning.   docusate sodium  (COLACE) 100 MG capsule Take 100 mg by mouth daily.   DULoxetine  (CYMBALTA ) 60 MG capsule Take 1 capsule (60 mg total) by mouth daily.   fluticasone  (FLONASE ) 50 MCG/ACT nasal spray Place 2 sprays into both nostrils daily.   losartan -hydrochlorothiazide   (HYZAAR) 50-12.5 MG tablet Take 1 tablet by mouth daily.   Multiple Vitamins-Minerals (MULTIVITAMIN WITH MINERALS) tablet Take 1 tablet by mouth in the morning.   neomycin -polymyxin-hydrocortisone  (CORTISPORIN) OTIC solution Place 3 drops into both ears 3 (three) times daily.   simvastatin  (ZOCOR ) 20 MG tablet Take 1 tablet (20 mg total) by mouth daily at 6 PM.   SYNTHROID  88 MCG tablet Take 88 mcg by mouth daily before breakfast.   traZODone  (DESYREL ) 50 MG tablet TAKE 1 AND 1/2 TABLETS AT BEDTIME   nystatin -triamcinolone  ointment (MYCOLOG) Apply 1 Application topically 2 (two) times daily. (Patient not taking: Reported on 11/06/2023)   ondansetron  (ZOFRAN ) 4 MG tablet Take 1  tablet (4 mg total) by mouth every 8 (eight) hours as needed for vomiting, refractory nausea / vomiting or nausea. (Patient not taking: Reported on 11/06/2023)   pantoprazole  (PROTONIX ) 40 MG tablet Take 1 tablet (40 mg total) by mouth daily. (Patient not taking: Reported on 11/06/2023)   No current facility-administered medications on file prior to visit.     No Known Allergies  Review Of Systems:  Constitutional:   No  weight loss, night sweats,  Fevers, chills, fatigue, or  lassitude.  HEENT:   No headaches,  Difficulty swallowing,  Tooth/dental problems, or  Sore throat,                No sneezing, itching, ear ache, nasal congestion, post nasal drip,   CV:  No chest pain,  Orthopnea, PND, swelling in lower extremities, anasarca, dizziness, palpitations, syncope.   GI  No heartburn, indigestion, abdominal pain, nausea, vomiting, diarrhea, change in bowel habits, loss of appetite, bloody stools.   Resp: No shortness of breath with exertion or at rest.  No excess mucus, no productive cough,  No non-productive cough,  No coughing up of blood.  No change in color of mucus.  No wheezing.  No chest wall deformity  Skin: no rash or lesions.  GU: no dysuria, change in color of urine, no urgency or frequency.  No flank  pain, no hematuria   MS:  No joint pain or swelling.  No decreased range of motion.  No back pain.  Psych:  No change in mood or affect. No depression or anxiety.  No memory loss.   Vital Signs BP 111/71   Pulse 75   Temp 98 F (36.7 C) (Temporal)   Ht 5' 7 (1.702 m)   Wt 186 lb 9.6 oz (84.6 kg)   SpO2 95%   BMI 29.23 kg/m    Physical Exam:  General- No distress,  A&Ox3, pleasant  ENT: No sinus tenderness, TM clear, pale nasal mucosa, no oral exudate,no post nasal drip, no LAN Cardiac: S1, S2, regular rate and rhythm, no murmur Chest: No wheeze/ rales/ dullness; no accessory muscle use, no nasal flaring, no sternal retractions Abd.: Soft Non-tender, ND, BS +, Body mass index is 29.23 kg/m.  Ext: No clubbing cyanosis, edema, no obvious deformities Neuro:  normal strength, MAE x 4, A&O x 3, appropriate Skin: No rashes, warm and dry, no obvious skin lesions  Psych: normal mood and behavior    Assessment & Plan Right lower lobe pulmonary nodule 15 mm lobular nodule in right lower lobe with smooth margins, no metabolic activity on PET scan, suggesting benign etiology.  No metastatic disease or concerning lymph nodes. - Order follow-up CT chest in three months to monitor nodule stability. - If stable at three months, plan six-month follow-up CT. - If stable at six months, plan annual CT scans. - Send note to Dr. Rollene with PET scan results showing no metabolic activity. - Instruct to call if unexplained weight loss or hemoptysis occurs.    I spent 25 minutes dedicated to the care of this patient on the date of this encounter to include pre-visit review of records, face-to-face time with the patient discussing conditions above, post visit ordering of testing, clinical documentation with the electronic health record, making appropriate referrals as documented, and communicating necessary information to the patient's healthcare team.    Lauraine JULIANNA Lites, NP 11/06/2023  9:40  AM

## 2023-11-06 NOTE — Patient Instructions (Addendum)
 It is good to see you today. Your PET scan does not show any hypermetabolic activity in the lung nodule or the liver, which is great news. We will do a 3 month follow up Ct Chest to monitor the lung nodule for stability.  If this is stable in 3 months we will do a 6 month follow up, and then annual scan if it remains stable.  Call for any unexplained weight loss or blood in your sputum. See you in 3 months after the CT Chest. Please contact office for sooner follow up if symptoms do not improve or worsen or seek emergency care

## 2023-11-08 ENCOUNTER — Telehealth: Payer: Self-pay

## 2023-11-08 NOTE — Telephone Encounter (Signed)
 LVM, advised appt on 11/15/2023 was scheduled by the facility, not our office. Advised the 11/15/2023 appt has been cancelled and she will be called back in November, closer to the due to date to get scheduled. CT Chest due 02/02/2024.  Copied from CRM [id number]. Topic: Appointments - Scheduling Inquiry for Clinic >> Nov 08, 2023  9:15 AM [name] wrote: Reason for CRM:  Patient scheduled for a CT on 9/4 but states she is only to have those every 3 months, and is request a call back to reschedule this.

## 2023-11-15 ENCOUNTER — Other Ambulatory Visit

## 2023-11-27 DIAGNOSIS — Z96651 Presence of right artificial knee joint: Secondary | ICD-10-CM | POA: Diagnosis not present

## 2023-11-27 DIAGNOSIS — Z471 Aftercare following joint replacement surgery: Secondary | ICD-10-CM | POA: Diagnosis not present

## 2023-12-26 ENCOUNTER — Other Ambulatory Visit: Payer: Self-pay | Admitting: Internal Medicine

## 2023-12-26 DIAGNOSIS — Z1231 Encounter for screening mammogram for malignant neoplasm of breast: Secondary | ICD-10-CM

## 2024-01-16 ENCOUNTER — Encounter: Payer: Self-pay | Admitting: Acute Care

## 2024-01-16 DIAGNOSIS — H2000 Unspecified acute and subacute iridocyclitis: Secondary | ICD-10-CM | POA: Diagnosis not present

## 2024-01-16 DIAGNOSIS — H5711 Ocular pain, right eye: Secondary | ICD-10-CM | POA: Diagnosis not present

## 2024-01-21 ENCOUNTER — Ambulatory Visit
Admission: RE | Admit: 2024-01-21 | Discharge: 2024-01-21 | Disposition: A | Source: Ambulatory Visit | Attending: Acute Care | Admitting: Acute Care

## 2024-01-21 ENCOUNTER — Other Ambulatory Visit

## 2024-01-21 DIAGNOSIS — R911 Solitary pulmonary nodule: Secondary | ICD-10-CM

## 2024-01-21 DIAGNOSIS — R918 Other nonspecific abnormal finding of lung field: Secondary | ICD-10-CM | POA: Diagnosis not present

## 2024-01-22 DIAGNOSIS — H2000 Unspecified acute and subacute iridocyclitis: Secondary | ICD-10-CM | POA: Diagnosis not present

## 2024-01-24 ENCOUNTER — Ambulatory Visit
Admission: RE | Admit: 2024-01-24 | Discharge: 2024-01-24 | Disposition: A | Source: Ambulatory Visit | Attending: Internal Medicine | Admitting: Internal Medicine

## 2024-01-24 DIAGNOSIS — Z1231 Encounter for screening mammogram for malignant neoplasm of breast: Secondary | ICD-10-CM

## 2024-01-29 ENCOUNTER — Other Ambulatory Visit: Payer: Self-pay | Admitting: Internal Medicine

## 2024-01-29 DIAGNOSIS — R928 Other abnormal and inconclusive findings on diagnostic imaging of breast: Secondary | ICD-10-CM

## 2024-01-30 ENCOUNTER — Encounter: Payer: Self-pay | Admitting: Acute Care

## 2024-01-30 ENCOUNTER — Ambulatory Visit: Admitting: Acute Care

## 2024-01-30 VITALS — BP 128/82 | HR 71 | Temp 97.7°F | Ht 66.0 in | Wt 190.6 lb

## 2024-01-30 DIAGNOSIS — R918 Other nonspecific abnormal finding of lung field: Secondary | ICD-10-CM

## 2024-01-30 DIAGNOSIS — R9389 Abnormal findings on diagnostic imaging of other specified body structures: Secondary | ICD-10-CM | POA: Diagnosis not present

## 2024-01-30 DIAGNOSIS — R911 Solitary pulmonary nodule: Secondary | ICD-10-CM | POA: Diagnosis not present

## 2024-01-30 NOTE — Progress Notes (Signed)
 History of Present Illness [name] is a 73 y.o. female  never smoker referred August 2025 to see Dr. Shelah regarding abnormal chest imaging. She is currently under surveillance of the nodule which has been stable.She is here today for a 3 month follow up scan review.   01/30/2024 Discussed the use of AI scribe software for clinical note transcription with the patient, who gave verbal consent to proceed.  History of Present Illness [name] is a 73 year old female with pulmonary nodules who presents for follow-up evaluation after 3 month follow up Ct chest.   She states she has been doing well. No respiratory or other issues. She does have some nasal drainage every day. It is clear in color, and just usually once at night.   We have reviewed her CT Chest. The  right lower lobe nodule is unchanged, lobulated, somewhat serpiginous , and  measuring 1.5 x 0.7 cm.. The scan also revealed unchanged, nonspecific small nodules and no evidence of lymphadenopathy.   Plan will be for a 6 month follow up CT Chest to ensure continued stability. This will be due 07/2024  She has no symptoms of hemoptysis or unintentional weight loss, although she notes weight gain. Nasal congestion occurs primarily at night but resolves easily with blowing her nose.  Her past medical history includes a stable hepatic hemangioma identified in an abdominal scan conducted in August, with no worrisome hepatic lesions noted.  She is currently on a statin for coronary artery disease and medication for hypertension, which she took the morning of the visit. She knows to return sooner for any weight loss or hemoptysis.     Test Results:  CT Chest 01/21/2024 Unchanged, lobulated, somewhat serpiginous nodule in the peripheral right lower lobe measuring 1.5 x 0.7 cm. This was not previously FDG avid and may reflect a chronically impacted bronchocele. 2. Unchanged additional nonspecific small nodules,  including a ground-glass nodules in the peripheral right lower lobe measuring up to 0.4 cm. Attention on follow-up. 3. No evidence of lymphadenopathy or metastatic disease in the chest. 4. Coronary artery disease.  PET 11/01/2023 IMPRESSION: 1. No metabolic activity associated with lobular nodule in the superior segment of the RIGHT lower lobe. Findings favor benign etiology or indolent neoplasm. 2. No evidence of metastatic disease.   CT chest without contrast 09/27/2023 1.5 x 0.7 cm subpleural right lower lobe nodule. This is mixed attenuation but the presence of macroscopic fat could not be confirmed. Its shape suggests potentially a chronically impacted peripheral airway but this too could not be confirmed. If the observed liver abnormality turns out to be a primary or metastatic neoplasm, these nodules must be considered suspicious for metastases. Otherwise, 3 month follow-up CT, PET-CT or tissue sampling would be the typical recommendation. 3. Multiple other smaller nodules in both lungs, largest 5 mm. 4. Aortic and coronary artery atherosclerosis. 5. 4 x 3.1 cm hypodense mass in segment 5 of the liver is suspected on the last 3 images of the study. CT abdomen and pelvis, preferably with IV contrast if the patient can tolerate contrast, is recommended as quickly as possible.   10/12/2023 CT abdomen with and without contrast Stable (since CT scan 2021) large segment 5 hepatic hemangioma. No worrisome hepatic lesions or intrahepatic biliary dilatation. 2. Small bilateral pulmonary nodules as demonstrated on the recent chest CT scan and worrisome for pulmonary metastatic disease. Some of these nodules could be seen in the lung bases on the CT scan  of the abdomen from 12/14/2021 but several are new and most were not covered. 3. No acute abdominal findings, mass lesions or adenopathy.          Latest Ref Rng & Units 09/18/2023    4:25 PM 03/15/2023   10:06 AM 11/08/2022   10:28 AM   CBC  WBC 4.0 - 10.5 K/uL 6.4  4.6  5.3   Hemoglobin 12.0 - 15.0 g/dL 85.5  87.0  86.1   Hematocrit 36.0 - 46.0 % 44.0  39.6  42.6   Platelets 150.0 - 400.0 K/uL 242.0  273.0  251        Latest Ref Rng & Units 09/18/2023    4:25 PM 03/15/2023   10:06 AM 11/08/2022   10:28 AM  BMP  Glucose 70 - 99 mg/dL 873  98  894   BUN 6 - 23 mg/dL 18  16  17    Creatinine 0.40 - 1.20 mg/dL 9.07  9.31  9.15   Sodium 135 - 145 mEq/L 139  141  138   Potassium 3.5 - 5.1 mEq/L 4.0  3.9  4.1   Chloride 96 - 112 mEq/L 102  108  105   CO2 19 - 32 mEq/L 30  26  26    Calcium 8.4 - 10.5 mg/dL 9.5  9.0  9.3     BNP No results found for: BNP  ProBNP    Component Value Date/Time   PROBNP 27.0 06/12/2017 1053    PFT No results found for: FEV1PRE, FEV1POST, FVCPRE, FVCPOST, TLC, DLCOUNC, PREFEV1FVCRT, PSTFEV1FVCRT  MM 3D SCREENING MAMMOGRAM BILATERAL BREAST Result Date: 01/28/2024 CLINICAL DATA:  Screening. EXAM: DIGITAL SCREENING BILATERAL MAMMOGRAM WITH TOMOSYNTHESIS AND CAD TECHNIQUE: Bilateral screening digital craniocaudal and mediolateral oblique mammograms were obtained. Bilateral screening digital breast tomosynthesis was performed. The images were evaluated with computer-aided detection. COMPARISON:  Previous exam(s). ACR Breast Density Category c: The breasts are heterogeneously dense, which may obscure small masses. FINDINGS: In the right breast, a possible asymmetry and separate calcifications warrant further evaluation. In the left breast, no findings suspicious for malignancy. IMPRESSION: Further evaluation is suggested for possible asymmetry and separate calcifications in the right breast. RECOMMENDATION: Diagnostic mammogram and possibly ultrasound of the right breast. (Code:[id number]) The patient will be contacted regarding the findings, and additional imaging will be scheduled. BI-RADS CATEGORY  0: Incomplete: Need additional imaging evaluation. Electronically Signed   By:  Corean Salter M.D.   On: 01/28/2024 08:36   CT CHEST WO CONTRAST Result Date: 01/23/2024 CLINICAL DATA:  Follow-up pulmonary nodule * Tracking Code: BO * EXAM: CT CHEST WITHOUT CONTRAST TECHNIQUE: Multidetector CT imaging of the chest was performed following the standard protocol without IV contrast. RADIATION DOSE REDUCTION: This exam was performed according to the departmental dose-optimization program which includes automated exposure control, adjustment of the mA and/or kV according to patient size and/or use of iterative reconstruction technique. COMPARISON:  PET-CT, 11/01/2023, CT chest, 09/27/2023 FINDINGS: Cardiovascular: No significant vascular findings. Normal heart size. Scattered left coronary artery calcifications. No pericardial effusion. Mediastinum/Nodes: No enlarged mediastinal, hilar, or axillary lymph nodes. Thyroid  gland, trachea, and esophagus demonstrate no significant findings. Lungs/Pleura: Unchanged, lobulated, somewhat serpiginous nodule in the peripheral right lower lobe measuring 1.5 x 0.7 cm (series 8, image 64). Unchanged additional small nodules, including a ground-glass nodules in the peripheral right lower lobe measuring up to 0.4 cm (series 8, image 75). Dependent bibasilar scarring or atelectasis. No pleural effusion or pneumothorax. Upper Abdomen: No acute abnormality. Musculoskeletal: No  chest wall abnormality. No acute osseous findings. IMPRESSION: 1. Unchanged, lobulated, somewhat serpiginous nodule in the peripheral right lower lobe measuring 1.5 x 0.7 cm. This was not previously FDG avid and may reflect a chronically impacted bronchocele. 2. Unchanged additional nonspecific small nodules, including a ground-glass nodules in the peripheral right lower lobe measuring up to 0.4 cm. Attention on follow-up. 3. No evidence of lymphadenopathy or metastatic disease in the chest. 4. Coronary artery disease. Electronically Signed   By: Marolyn JONETTA Jaksch M.D.   On: 01/23/2024 12:59      Past medical hx Past Medical History:  Diagnosis Date   Anxiety    Anxiety state 03/05/2023   Arthritis    Blood in stool    Colon polyps    Hyperlipidemia    Hypertension    Thyroid  disease      Social History   Tobacco Use   Smoking status: Never    Passive exposure: Never   Smokeless tobacco: Never  Vaping Use   Vaping status: Never Used  Substance Use Topics   Alcohol use: No    Alcohol/week: 0.0 standard drinks of alcohol   Drug use: No    Ms.Nickerson reports that she has never smoked. She has never been exposed to tobacco smoke. She has never used smokeless tobacco. She reports that she does not drink alcohol and does not use drugs.  Tobacco Cessation: Counseling given: Not Answered Never smoker  Past surgical hx, Family hx, Social hx all reviewed.  Current Outpatient Medications on File Prior to Visit  Medication Sig   Ascorbic Acid  (VITAMIN C  PO) Take 1 tablet by mouth in the morning.   B Complex-C (SUPER B COMPLEX PO) Take 1 tablet by mouth in the morning.   carboxymethylcellulose (REFRESH PLUS) 0.5 % SOLN Place 1 drop into both eyes 3 (three) times daily as needed (dry/irritated eyes.).   Cholecalciferol  (VITAMIN D -3 PO) Take 1 tablet by mouth in the morning.   docusate sodium  (COLACE) 100 MG capsule Take 100 mg by mouth daily.   DULoxetine  (CYMBALTA ) 60 MG capsule Take 1 capsule (60 mg total) by mouth daily.   fluticasone  (FLONASE ) 50 MCG/ACT nasal spray Place 2 sprays into both nostrils daily.   losartan -hydrochlorothiazide  (HYZAAR) 50-12.5 MG tablet Take 1 tablet by mouth daily.   Multiple Vitamins-Minerals (MULTIVITAMIN WITH MINERALS) tablet Take 1 tablet by mouth in the morning.   neomycin -polymyxin-hydrocortisone  (CORTISPORIN) OTIC solution Place 3 drops into both ears 3 (three) times daily.   simvastatin  (ZOCOR ) 20 MG tablet Take 1 tablet (20 mg total) by mouth daily at 6 PM.   SYNTHROID  88 MCG tablet Take 88 mcg by mouth daily before breakfast.    traZODone  (DESYREL ) 50 MG tablet TAKE 1 AND 1/2 TABLETS AT BEDTIME   pantoprazole  (PROTONIX ) 40 MG tablet Take 1 tablet (40 mg total) by mouth daily. (Patient not taking: Reported on 01/30/2024)   No current facility-administered medications on file prior to visit.     No Known Allergies  Review Of Systems:  Constitutional:   No  weight loss, night sweats,  Fevers, chills, fatigue, or  lassitude.  HEENT:   No headaches,  Difficulty swallowing,  Tooth/dental problems, or  Sore throat,                No sneezing, itching, ear ache, nasal congestion, post nasal drip,   CV:  No chest pain,  Orthopnea, PND, swelling in lower extremities, anasarca, dizziness, palpitations, syncope.   GI  No heartburn, indigestion,  abdominal pain, nausea, vomiting, diarrhea, change in bowel habits, loss of appetite, bloody stools.   Resp: No shortness of breath with exertion or at rest.  No excess mucus, no productive cough,  No non-productive cough,  No coughing up of blood.  No change in color of mucus.  No wheezing.  No chest wall deformity  Skin: no rash or lesions.  GU: no dysuria, change in color of urine, no urgency or frequency.  No flank pain, no hematuria   MS:  No joint pain or swelling.  No decreased range of motion.  No back pain.  Psych:  No change in mood or affect. No depression or anxiety.  No memory loss.   Vital Signs BP (!) 148/56   Pulse 71   Temp 97.7 F (36.5 C) (Oral)   Ht 5' 6 (1.676 m)   Wt 190 lb 9.6 oz (86.5 kg)   SpO2 97%   BMI 30.76 kg/m    Physical Exam:  Physical Exam GENERAL: No distress, alert and oriented times 3. EARS NOSE THROAT: No sinus tenderness, tympanic membranes clear, pale nasal mucosa, no oral exudate, no post nasal drip, no lymphadenopathy. CHEST: Lungs clear to auscultation bilaterally, no wheeze, rales, dullness, no accessory muscle use, no nasal flaring, no sternal retractions. CARDIAC: S1, S2, regular rate and rhythm, no  murmur. ABDOMINAL: Soft, non tender. ND, BS present. EXTREMITIES: No clubbing, cyanosis, edema. No obvious deformities. NEUROLOGICAL: Normal strength. Alert and oriented x 3, MAE x 4. SKIN: No rashes, warm and dry. No obvious skin lesions. PSYCHIATRIC: Normal mood and behavior.   Assessment/Plan  Assessment and Plan Assessment & Plan Pulmonary nodules, stable, under surveillance Plan - Ordered follow-up chest scan in six months, due 07/2024. - Scheduled follow-up appointment one to two weeks post-scan to review results. - Advised to report unexplained weight loss or hemoptysis immediately.   I spent 20 minutes dedicated to the care of this patient on the date of this encounter to include pre-visit review of records, face-to-face time with the patient discussing conditions above, post visit ordering of testing, clinical documentation with the electronic health record, making appropriate referrals as documented, and communicating necessary information to the patient's healthcare team.     Lauraine JULIANNA Lites, NP 01/30/2024  8:38 AM

## 2024-01-30 NOTE — Patient Instructions (Addendum)
 It is good to see you today. We have reviewed your CT Chest. The nodule we have been watching is stable, which is reassuring. We will do a 6 month follow up Ct Chest to continue to monitor for change. You will get a call to get this scheduled closer to the time. Please call to be seen sooner if you have any unexplained weight loss or blood in your sputum when you cough.  Have a great holiday. Please contact office for sooner follow up if symptoms do not improve or worsen or seek emergency care

## 2024-02-11 ENCOUNTER — Other Ambulatory Visit: Payer: Self-pay | Admitting: Internal Medicine

## 2024-02-11 ENCOUNTER — Ambulatory Visit
Admission: RE | Admit: 2024-02-11 | Discharge: 2024-02-11 | Disposition: A | Source: Ambulatory Visit | Attending: Internal Medicine | Admitting: Internal Medicine

## 2024-02-11 DIAGNOSIS — N6489 Other specified disorders of breast: Secondary | ICD-10-CM | POA: Diagnosis not present

## 2024-02-11 DIAGNOSIS — R928 Other abnormal and inconclusive findings on diagnostic imaging of breast: Secondary | ICD-10-CM | POA: Diagnosis not present

## 2024-02-12 ENCOUNTER — Ambulatory Visit

## 2024-02-12 VITALS — BP 122/80 | HR 69 | Ht 66.0 in | Wt 188.6 lb

## 2024-02-12 DIAGNOSIS — Z0000 Encounter for general adult medical examination without abnormal findings: Secondary | ICD-10-CM

## 2024-02-12 NOTE — Progress Notes (Signed)
 Chief Complaint  Patient presents with   Medicare Wellness     Subjective:   [name] is a 73 y.o. female who presents for a Medicare Annual Wellness Visit.  Visit info / Clinical Intake: Medicare Wellness Visit Type:: Subsequent Annual Wellness Visit Persons participating in visit and providing information:: patient Medicare Wellness Visit Mode:: In-person (required for WTM) Interpreter Needed?: No Pre-visit prep was completed: no AWV questionnaire completed by patient prior to visit?: no Living arrangements:: lives with spouse/significant other Patient's Overall Health Status Rating: good Typical amount of pain: none Does pain affect daily life?: no Are you currently prescribed opioids?: no  Dietary Habits and Nutritional Risks How many meals a day?: (!) 1 (supplament drink inbetween) Eats fruit and vegetables daily?: yes Most meals are obtained by: preparing own meals In the last 2 weeks, have you had any of the following?: none Diabetic:: no  Functional Status Activities of Daily Living (to include ambulation/medication): Independent Ambulation: Independent Medication Administration: Independent Home Management (perform basic housework or laundry): Independent Manage your own finances?: yes Primary transportation is: driving Concerns about vision?: no *vision screening is required for WTM* Concerns about hearing?: no  Fall Screening Falls in the past year?: 1 Number of falls in past year: 0 Was there an injury with Fall?: 0 Fall Risk Category Calculator: 1 Patient Fall Risk Level: Low Fall Risk  Fall Risk Patient at Risk for Falls Due to: No Fall Risks Fall risk Follow up: Falls evaluation completed; Falls prevention discussed  Home and Transportation Safety: All rugs have non-skid backing?: (!) no (in guess bathroom) All stairs or steps have railings?: yes (basement steps) Grab bars in the bathtub or shower?: yes (safe tube) Have non-skid  surface in bathtub or shower?: yes Good home lighting?: yes Regular seat belt use?: yes Hospital stays in the last year:: (!) yes How many hospital stays:: 3 Reason: Knee surgery  Cognitive Assessment Difficulty concentrating, remembering, or making decisions? : no Will 6CIT or Mini Cog be Completed: no 6CIT or Mini Cog Declined: patient alert, oriented, able to answer questions appropriately and recall recent events What year is it?: 0 points What month is it?: 0 points Give patient an address phrase to remember (5 components): [address] About what time is it?: 0 points Count backwards from 20 to 1: 0 points Say the months of the year in reverse: 0 points Repeat the address phrase from earlier: 0 points 6 CIT Score: 0 points  Advance Directives (For Healthcare) Does Patient Have a Medical Advance Directive?: No Would patient like information on creating a medical advance directive?: No - Patient declined  Reviewed/Updated  Reviewed/Updated: Reviewed All (Medical, Surgical, Family, Medications, Allergies, Care Teams, Patient Goals)    Allergies (verified) Patient has no known allergies.   Current Medications (verified) Outpatient Encounter Medications as of 02/12/2024  Medication Sig   Ascorbic Acid  (VITAMIN C  PO) Take 1 tablet by mouth in the morning.   B Complex-C (SUPER B COMPLEX PO) Take 1 tablet by mouth in the morning.   carboxymethylcellulose (REFRESH PLUS) 0.5 % SOLN Place 1 drop into both eyes 3 (three) times daily as needed (dry/irritated eyes.).   Cholecalciferol  (VITAMIN D -3 PO) Take 1 tablet by mouth in the morning.   docusate sodium  (COLACE) 100 MG capsule Take 100 mg by mouth daily.   DULoxetine  (CYMBALTA ) 60 MG capsule Take 1 capsule (60 mg total) by mouth daily.   fluticasone  (FLONASE ) 50 MCG/ACT nasal spray Place  2 sprays into both nostrils daily.   losartan -hydrochlorothiazide  (HYZAAR) 50-12.5 MG tablet Take 1 tablet by mouth daily.   Multiple  Vitamins-Minerals (MULTIVITAMIN WITH MINERALS) tablet Take 1 tablet by mouth in the morning.   neomycin -polymyxin-hydrocortisone  (CORTISPORIN) OTIC solution Place 3 drops into both ears 3 (three) times daily.   simvastatin  (ZOCOR ) 20 MG tablet Take 1 tablet (20 mg total) by mouth daily at 6 PM.   SYNTHROID  88 MCG tablet Take 88 mcg by mouth daily before breakfast.   traZODone  (DESYREL ) 50 MG tablet TAKE 1 AND 1/2 TABLETS AT BEDTIME   pantoprazole  (PROTONIX ) 40 MG tablet Take 1 tablet (40 mg total) by mouth daily. (Patient not taking: Reported on 02/12/2024)   No facility-administered encounter medications on file as of 02/12/2024.    History: Past Medical History:  Diagnosis Date   Anxiety    Anxiety state 03/05/2023   Arthritis    Blood in stool    Colon polyps    Hyperlipidemia    Hypertension    Thyroid  disease    Past Surgical History:  Procedure Laterality Date   ABDOMINAL HYSTERECTOMY     KNEE SURGERY Left 2018   arthroscopy   TOTAL KNEE ARTHROPLASTY Right 11/17/2022   Procedure: TOTAL KNEE ARTHROPLASTY;  Surgeon: Kay Kemps, MD;  Location: [facility];  Service: Orthopedics;  Laterality: Right;   Family History  Problem Relation Age of Onset   Hypertension Mother    Breast cancer Sister    Arthritis Sister    Cancer Sister        breast and colon   Diabetes Sister    Breast cancer Sister    Diabetes Paternal Aunt    Hypertension Paternal Grandmother    Social History   Occupational History   Occupation: RETIRED  Tobacco Use   Smoking status: Never    Passive exposure: Never   Smokeless tobacco: Never  Vaping Use   Vaping status: Never Used  Substance and Sexual Activity   Alcohol use: No    Alcohol/week: 0.0 standard drinks of alcohol   Drug use: No   Sexual activity: Not on file   Tobacco Counseling Counseling given: Not Answered  SDOH Screenings   Food Insecurity: No Food Insecurity (02/12/2024)  Housing: Unknown (02/12/2024)  Transportation Needs: No  Transportation Needs (02/12/2024)  Utilities: Not At Risk (02/12/2024)  Alcohol Screen: Low Risk  (03/05/2023)  Depression (PHQ2-9): Low Risk  (02/12/2024)  Financial Resource Strain: Low Risk  (03/05/2023)  Physical Activity: Insufficiently Active (02/12/2024)  Social Connections: Socially Integrated (02/12/2024)  Stress: No Stress Concern Present (02/12/2024)  Tobacco Use: Low Risk  (02/12/2024)  Health Literacy: Adequate Health Literacy (02/12/2024)   See flowsheets for full screening details  Depression Screen PHQ 2 & 9 Depression Scale- Over the past 2 weeks, how often have you been bothered by any of the following problems? Little interest or pleasure in doing things: 0 Feeling down, depressed, or hopeless (PHQ Adolescent also includes...irritable): 0 PHQ-2 Total Score: 0 Trouble falling or staying asleep, or sleeping too much: 0 Feeling tired or having little energy: 0 Poor appetite or overeating (PHQ Adolescent also includes...weight loss): 0 Feeling bad about yourself - or that you are a failure or have let yourself or your family down: 0 Trouble concentrating on things, such as reading the newspaper or watching television (PHQ Adolescent also includes...like school work): 0 Moving or speaking so slowly that other people could have noticed. Or the opposite - being so fidgety or restless that  you have been moving around a lot more than usual: 0 Thoughts that you would be better off dead, or of hurting yourself in some way: 0 PHQ-9 Total Score: 0 If you checked off any problems, how difficult have these problems made it for you to do your work, take care of things at home, or get along with other people?: Not difficult at all  Depression Treatment Depression Interventions/Treatment : EYV7-0 Score <4 Follow-up Not Indicated     Goals Addressed             This Visit's Progress    My goal for 2024 is to stay healthy, work out more and take one day at a time.   On track             Objective:    Today's Vitals   02/12/24 1057  BP: 122/80  Pulse: 69  SpO2: 98%  Weight: 188 lb 9.6 oz (85.5 kg)  Height: 5' 6 (1.676 m)   Body mass index is 30.44 kg/m.  Hearing/Vision screen Hearing Screening - Comments:: Denies hearing difficulties   Vision Screening - Comments:: Denies vision issues./Dr. Ara  Immunizations and Health Maintenance Health Maintenance  Topic Date Due   Influenza Vaccine  06/10/2024 (Originally 10/12/2023)   COVID-19 Vaccine (6 - 2025-26 season) 05/28/2024   Medicare Annual Wellness (AWV)  02/11/2025   Colonoscopy  04/07/2025   Mammogram  01/23/2026   Pneumococcal Vaccine: 50+ Years  Completed   Bone Density Scan  Completed   Hepatitis C Screening  Completed   Zoster Vaccines- Shingrix  Completed   Meningococcal B Vaccine  Aged Out   DTaP/Tdap/Td  Discontinued        Assessment/Plan:  This is a routine wellness examination for [name].  Patient Care Team: Rollene Almarie LABOR, MD as PCP - General (Internal Medicine) Kristie Lamprey, MD as Consulting Physician (Gastroenterology) Claudene Elsie JUDITHANN Mickey., MD as Consulting Physician (Endocrinology) Robinson Mayo, OD as Referring Physician (Optometry)  I have personally reviewed and noted the following in the patient's chart:   Medical and social history Use of alcohol, tobacco or illicit drugs  Current medications and supplements including opioid prescriptions. Functional ability and status Nutritional status Physical activity Advanced directives List of other physicians Hospitalizations, surgeries, and ER visits in previous 12 months Vitals Screenings to include cognitive, depression, and falls Referrals and appointments  No orders of the defined types were placed in this encounter.  In addition, I have reviewed and discussed with patient certain preventive protocols, quality metrics, and best practice recommendations. A written personalized care plan for preventive  services as well as general preventive health recommendations were provided to patient.   Endre Coutts L Chucky Homes, CMA   02/12/2024   Return in 1 year (on 02/11/2025).  After Visit Summary: (MyChart) Due to this being a telephonic visit, the after visit summary with patients personalized plan was offered to patient via MyChart   Nurse Notes: Patient is up to date on all health maintenance with no concerns to address today.

## 2024-02-12 NOTE — Patient Instructions (Addendum)
 [name],  Thank you for taking the time for your Medicare Wellness Visit. I appreciate your continued commitment to your health goals. Please review the care plan we discussed, and feel free to reach out if I can assist you further.  Please note that Annual Wellness Visits do not include a physical exam. Some assessments may be limited, especially if the visit was conducted virtually. If needed, we may recommend an in-person follow-up with your provider.  Ongoing Care Seeing your primary care provider every 3 to 6 months helps us  monitor your health and provide consistent, personalized care. Next office visit on 03/18/2024.  Keep up the good work.    Referrals If a referral was made during today's visit and you haven't received any updates within two weeks, please contact the referred provider directly to check on the status.  Recommended Screenings:  Health Maintenance  Topic Date Due   Medicare Annual Wellness Visit  03/04/2024   Flu Shot  06/10/2024*   COVID-19 Vaccine (6 - 2025-26 season) 05/28/2024   Colon Cancer Screening  04/07/2025   Breast Cancer Screening  01/23/2026   Pneumococcal Vaccine for age over 69  Completed   Osteoporosis screening with Bone Density Scan  Completed   Hepatitis C Screening  Completed   Zoster (Shingles) Vaccine  Completed   Meningitis B Vaccine  Aged Out   DTaP/Tdap/Td vaccine  Discontinued  *Topic was postponed. The date shown is not the original due date.       03/05/2023    9:14 AM  Advanced Directives  Does Patient Have a Medical Advance Directive? No  Would patient like information on creating a medical advance directive? No - Patient declined    Vision: Annual vision screenings are recommended for early detection of glaucoma, cataracts, and diabetic retinopathy. These exams can also reveal signs of chronic conditions such as diabetes and high blood pressure.  Dental: Annual dental screenings help detect early signs of oral cancer, gum  disease, and other conditions linked to overall health, including heart disease and diabetes.  Please see the attached documents for additional preventive care recommendations.

## 2024-02-13 ENCOUNTER — Ambulatory Visit: Payer: Self-pay | Admitting: Internal Medicine

## 2024-02-19 ENCOUNTER — Encounter: Payer: Self-pay | Admitting: Pharmacist

## 2024-02-19 NOTE — Progress Notes (Signed)
 Pharmacy Quality Measure Review  This patient is appearing on a report for being at risk of failing the adherence measure for cholesterol (statin) medications this calendar year.   Medication: Simvastatin  Last fill date: 02/12/24 for 90 day supply  Insurance report was not up to date. No action needed at this time.   Darrelyn Drum, PharmD, BCPS, CPP Clinical Pharmacist Practitioner  Primary Care at [facility] [phone]

## 2024-02-20 ENCOUNTER — Ambulatory Visit
Admission: RE | Admit: 2024-02-20 | Discharge: 2024-02-20 | Disposition: A | Discharge status: Home / Self Care | Source: Ambulatory Visit | Attending: Internal Medicine | Admitting: Internal Medicine

## 2024-02-20 ENCOUNTER — Inpatient Hospital Stay: Admission: RE | Admit: 2024-02-20 | Discharge: 2024-02-20 | Attending: Internal Medicine | Admitting: Internal Medicine

## 2024-02-20 DIAGNOSIS — R928 Other abnormal and inconclusive findings on diagnostic imaging of breast: Secondary | ICD-10-CM | POA: Diagnosis not present

## 2024-02-20 DIAGNOSIS — N6489 Other specified disorders of breast: Secondary | ICD-10-CM

## 2024-02-20 HISTORY — PX: BREAST BIOPSY: SHX20

## 2024-02-21 ENCOUNTER — Other Ambulatory Visit: Payer: Self-pay | Admitting: Internal Medicine

## 2024-02-21 DIAGNOSIS — R928 Other abnormal and inconclusive findings on diagnostic imaging of breast: Secondary | ICD-10-CM

## 2024-02-21 LAB — SURGICAL PATHOLOGY

## 2024-02-26 NOTE — Telephone Encounter (Signed)
 Breast Tumor Boards  [name] will be presented on the Breast tumor board on 02/27/2024. [name] has a personal history of breast cancer.  [name] meets [employer] (NCCN) criteria for genetic testing for a hereditary cancer syndrome based on her personal and family history of breast cancer. Her family history includes one sister with breast cancer and a sister with breast and colon cancer.  Danielle Fryer, MS, CGC  Certified Genetic Counselor  Email: [email or web address]  Phone: [phone]

## 2024-02-27 ENCOUNTER — Ambulatory Visit
Admission: RE | Admit: 2024-02-27 | Discharge: 2024-02-27 | Disposition: A | Discharge status: Home / Self Care | Source: Ambulatory Visit | Attending: Internal Medicine | Admitting: Internal Medicine

## 2024-02-27 ENCOUNTER — Inpatient Hospital Stay: Admission: RE | Admit: 2024-02-27 | Discharge: 2024-02-27 | Attending: Internal Medicine | Admitting: Internal Medicine

## 2024-02-27 DIAGNOSIS — R928 Other abnormal and inconclusive findings on diagnostic imaging of breast: Secondary | ICD-10-CM

## 2024-02-27 HISTORY — PX: BREAST BIOPSY: SHX20

## 2024-02-28 LAB — SURGICAL PATHOLOGY

## 2024-02-29 ENCOUNTER — Ambulatory Visit: Payer: Self-pay | Admitting: Surgery

## 2024-02-29 DIAGNOSIS — C50911 Malignant neoplasm of unspecified site of right female breast: Secondary | ICD-10-CM

## 2024-02-29 NOTE — Progress Notes (Signed)
 "   REFERRING PHYSICIAN:  Mir, Aliene Senior, MD PROVIDER:  DEBBY CURTISTINE SHIPPER, MD MRN: [id number] DOB: 1951-01-01 DATE OF ENCOUNTER: 02/29/2024 Subjective    Chief Complaint: New Consultation   History of Present Illness: [name] is a 73 y.o. female who is seen today as an office consultation for evaluation of New Consultation   Patient seen today for an abnormal right breast mammogram.  She is found to have 2 areas in the right upper outer quadrant of the breast.  The largest measures 6 mm.  Core biopsy proven to be invasive ductal carcinoma ER positive, PR positive, HER2/neu negative, with a KI of 5%.  The second area in the right breast same quadrant was ADH.  No history of breast cancer.  She gets her mammograms yearly.  Denies any history of breast pain breast mass or nipple discharge.    Review of Systems: A complete review of systems was obtained from the patient.  I have reviewed this information and discussed as appropriate with the patient.  See HPI as well for other ROS.     Medical History: Past Medical History:  Diagnosis Date   Arthritis    GERD (gastroesophageal reflux disease)    Hyperlipidemia    Hypertension    Thyroid  disease     There is no problem list on file for this patient.   Past Surgical History:  Procedure Laterality Date   JOINT REPLACEMENT       No Known Allergies  Current Outpatient Medications on File Prior to Visit  Medication Sig Dispense Refill   carboxymethylcellulose (REFRESH TEARS) 0.5 % ophthalmic solution Apply 1 drop to eye     cyclobenzaprine (FLEXERIL) 5 MG tablet Take 5 mg by mouth 3 (three) times daily as needed for Muscle spasms     docusate (COLACE) 100 MG capsule Take 100 mg by mouth once daily     DULoxetine  (CYMBALTA ) 60 MG DR capsule Take 60 mg by mouth once daily     fluticasone  propionate (FLONASE ) 50 mcg/actuation nasal spray Place 2 sprays into both nostrils once daily     gabapentin  (NEURONTIN) 100 MG capsule Take 100 mg by mouth 3 (three) times daily     losartan -hydroCHLOROthiazide  (HYZAAR) 50-12.5 mg tablet Take 1 tablet by mouth once daily     multivitamin with minerals tablet Take 1 tablet by mouth every morning     nystatin -triamcinolone  ointment      pantoprazole  (PROTONIX ) 40 MG DR tablet Take 40 mg by mouth once daily     simvastatin  (ZOCOR ) 20 MG tablet      SYNTHROID  88 mcg tablet Take 88 mcg by mouth once daily     traZODone  (DESYREL ) 50 MG tablet      No current facility-administered medications on file prior to visit.    Family History  Problem Relation Age of Onset   High blood pressure (Hypertension) Mother    Hyperlipidemia (Elevated cholesterol) Mother    Heart valve disease Father    High blood pressure (Hypertension) Sister    Hyperlipidemia (Elevated cholesterol) Sister    Diabetes Sister    Colon cancer Sister    Breast cancer Sister      Social History   Tobacco Use  Smoking Status Former   Types: Cigarettes  Smokeless Tobacco Never     Social History   Socioeconomic History   Marital status: Married  Tobacco Use   Smoking status: Former    Types: Cigarettes   Smokeless  tobacco: Never  Substance and Sexual Activity   Alcohol use: Never   Drug use: Never   Social Drivers of Corporate Investment Banker Strain: Low Risk (03/05/2023)   Received from [facility]   Overall Financial Resource Strain (CARDIA)    Difficulty of Paying Living Expenses: Not hard at all  Food Insecurity: No Food Insecurity (02/12/2024)   Received from [facility]   Hunger Vital Sign    Within the past 12 months, you worried that your food would run out before you got the money to buy more.: Never true    Within the past 12 months, the food you bought just didn't last and you didn't have money to get more.: Never true  Transportation Needs: No Transportation Needs (02/12/2024)   Received from [facility] -  Transportation    In the past 12 months, has lack of transportation kept you from medical appointments or from getting medications?: No    In the past 12 months, has lack of transportation kept you from meetings, work, or from getting things needed for daily living?: No  Physical Activity: Insufficiently Active (02/12/2024)   Received from [facility]   Exercise Vital Sign    On average, how many days per week do you engage in moderate to strenuous exercise (like a brisk walk)?: 3 days    On average, how many minutes do you engage in exercise at this level?: 30 min  Stress: No Stress Concern Present (02/12/2024)   Received from [facility] - Occupational Stress Questionnaire    Do you feel stress - tense, restless, nervous, or anxious, or unable to sleep at night because your mind is troubled all the time - these days?: Not at all  Social Connections: Socially Integrated (02/12/2024)   Received from [facility]   Social Connection and Isolation Panel    In a typical week, how many times do you talk on the phone with family, friends, or neighbors?: More than three times a week    How often do you get together with friends or relatives?: Once a week    How often do you attend church or religious services?: More than 4 times per year    Do you belong to any clubs or organizations such as church groups, unions, fraternal or athletic groups, or school groups?: Yes    How often do you attend meetings of the clubs or organizations you belong to?: More than 4 times per year    Are you married, widowed, divorced, separated, never married, or living with a partner?: Married  Housing Stability: Unknown (02/29/2024)   Housing Stability Vital Sign    Homeless in the Last Year: No    Objective:   Vitals:   02/29/24 1024  BP: 135/78  Pulse: 77  Temp: 36.5 C (97.7 F)  SpO2: 94%  Weight: 85.8 kg (189 lb 3.2 oz)  Height: 167.6 cm (5' 6)   PainSc: 0-No pain    Body mass index is 30.54 kg/m.  Physical Exam Exam conducted with a chaperone present.  Cardiovascular:     Rate and Rhythm: Normal rate.  Pulmonary:     Effort: Pulmonary effort is normal.  Chest:  Breasts:    Right: Normal. No mass.     Left: Normal. No mass.  Musculoskeletal:     Cervical back: Normal range of motion.  Lymphadenopathy:     Upper Body:  Right upper body: No supraclavicular or axillary adenopathy.     Left upper body: No supraclavicular or axillary adenopathy.        Labs, Imaging and Diagnostic Testing: FINAL DIAGNOSIS       1. Breast, right, needle core biopsy, UOQ (ribbon clip) :      INVASIVE MAMMARY CARCINOMA, SEE NOTE      MAMMARY CARCINOMA IN SITU, SOLID, INTERMEDIATE NUCLEAR GRADE, WITH FOCAL      NECROSIS      TUBULE FORMATION: SCORE 3      NUCLEAR PLEOMORPHISM: SCORE 3      MITOTIC COUNT: SCORE 1      TOTAL SCORE: 7      OVERALL GRADE: 2      LYMPHOVASCULAR INVASION: NOT IDENTIFIED      CANCER LENGTH: 0.9 CM      CALCIFICATIONS: NOT IDENTIFIED      OTHER FINDINGS: FIBROADENOMATOID CHANGE      SEE NOTE       Diagnosis Note : Dr. Macie reviewed the case and concurs with the interpretation.      E-cadherin and breast prognostic profile (ER, PR, Ki-67 and HER2) is pending and      will be reported in an addendum.  [facility] was notified on      02/21/2024.      DATE SIGNED OUT: 02/21/2024 ELECTRONIC SIGNATURE : Belvie Come, John, Pathologist, Electronic Signature  MICROSCOPIC DESCRIPTION  CASE COMMENTS STAINS USED IN DIAGNOSIS: H&E-2 H&E-3 H&E-4 H&E H&E-2 H&E-3 H&E-4 H&E H&E-2 H&E-3 H&E-4 H&E H&E-2 H&E-3 H&E-4 H&E *RECUT 1 SLIDE Universal Negative Control-DAB Stains used in diagnosis 1 E-CAD, 1 Her2 by IHC, 1 ER-ACIS, 1 KI-67-ACIS, 1 PR-ACIS Some of these immunohistochemical stains may have been developed and the performance characteristics determined by [facility].  Some may not have been cleared or approved by the U.S. Food and Drug Administration.  The FDA has determined that such clearance or approval is not necessary.  This test is used for clinical purposes.  It should not be regarded as investigational or for research.  This laboratory is certified under the Clinical Laboratory Improvement Amendments of 1988 (CLIA-88) as qualified to perform high complexity clinical laboratory testing. IHC scores are reported using ASCO/CAP scoring criteria.  An IHC Score of 0 or 1+  is NEGATIVE for HER2, 3+ is POSITIVE for HER2, and 2+ is EQUIVOCAL. Equivocal results are reflexed to either FISH or IHC testing. Specimens are fixed in 10% Neutral Buffered Formalin for at least 6 hours and up to 72 hours. These tests have not be validated on decalcified tissue.  Results should be interpreted with caution given the possibility of false negative results on decalcified specimens. Antibody Clone for HER2 is 4B5 (PATHWAY). Some of these immunohistochemical stains may have been developed and the performance characteristics determined by [facility].  Some may not have been cleared or approved by the U.S. Food and Drug Administration.  The FDA has determined that such clearance or approval is not necessary.  This test is used for clinical purposes.  It should not be regarded as investigational or for research.  This laboratory is certified under the Clinical Laboratory Improvement Amendments of 1988 (CLIA-88) as qualified to perform high complexity clinical laboratory testing. Estrogen receptor (6F11), immunohistochemical stains are performed on formalin fixed, paraffin embedded tissue using a 3,3-diaminobenzidine (DAB) chromogen and Leica Bond Autostainer System.  The staining intensity of the nucleus is scored manually and is reported  as the percentage of tumor cell nuclei demonstrating specific nuclear staining.Specimens are fixed in 10%  Neutral Buffered Formalin for at least 6 hours and up to 72 hours.  These tests have not be validated on decalcified tissue.  Results should be interpreted with caution given the possibility of false negative results on decalcified specimens. Ki-67 (MM1), immunohistochemical stains are performed on formalin fixed, paraffin embedded tissue using a 3,3-diaminobenzidine (DAB) chromogen and Leica Bond Autostainer System.  The staining intensity of the nucleus is scored manually and is reported as the percentage of tumor cell nuclei demonstrating specific nuclear staining.Specimens are fixed in 10% Neutral Buffered Formalin for at least 6 hours and up to 72 hours. These tests have not be validated on decalcified tissue.  Results should be interpreted with caution given the possibility of false negative results on decalcified specimens. PR progesterone receptor (16), immunohistochemical stains are performed on formalin fixed, paraffin embedded tissue using a 3,3-diaminobenzidine (DAB) chromogen and Leica Bond Autostainer System.  The staining intensity of the nucleus is scored manually and is reported as the percentage of tumor cell nuclei demonstrating specific nuclear staining.Specimens are fixed in 10% Neutral Buffered Formalin for at least 6 hours and up to 72 hours. These tests have not be validated on decalcified tissue.  Results should be interpreted with caution given the possibility of false negative results on decalcified specimens.  ADDENDUM 1) Breast, right, needle core biopsy, UOQ ( ribbon clip) PROGNOSTIC INDICATORS  Results: IMMUNOHISTOCHEMICAL AND MORPHOMETRIC ANALYSIS PERFORMED MANUALLY The tumor cells are NEGATIVE for Her2 (0). Estrogen Receptor:  90%, POSITIVE, STRONG STAINING INTENSITY Progesterone Receptor:  40%, POSITIVE, STRONG STAINING INTENSITY Proliferation Marker Ki67:  5% REFERENCE RANGE ESTROGEN RECEPTOR NEGATIVE     0% POSITIVE       =>1% REFERENCE RANGE  PROGESTERONE RECEPTOR NEGATIVE     0% POSITIVE        =>1% All controls stained appropriately Belvie Come, John, Pathologist, Electronic Signature ( Signed 12 12 2025) Immunohistochemistry for E-cadherin is negative consistent with lobular carcinoma. Belvie Come, John, Pathologist, Electronic Signature (E-cadherin immunohistochemistry Signed [phone])   CLINICAL HISTORY  SPECIMEN(S) OBTAINED 1. Breast, right, needle core biopsy, UOQ (ribbon Clip)  SPECIMEN COMMENTS: 1. TIF: 11:05am, CIT: < 5 min SPECIMEN CLINICAL INFORMATION: 1. 73 yr old woman with indeterminate 6mm architectural distortion presents for stereotactic guided biopsy    Gross Description 1. Received in formalin labeled [name],[name] and right breast distortion is a pinwheel breast chamber with chambers A-L containing yellow fibroadipose tissue (3.8 x 3.2 x 0.3 cm in aggregate).  The specimen is entirely submitted in four block.      TIF 11:05 a.m. CIT less than 5 mins. (BP:gt, 02/21/24)        Report signed out from the following location(s) [place] [address] [place], [place] [address] CLIA #: [id number]  Breast, right, needle core biopsy, upper outer quadrant (coil clip) :       -  FOCAL ATYPICAL DUCTAL HYPERPLASIA WITH ASSOCIATED CALCIFICATIONS.       NOTE: DR. SHARMA HAS PEER REVIEWED THE CASE AND AGREES WITH THE INTERPRETATION.   CLINICAL DATA:  73 year old female recalled for asymmetry and calcifications in the right breast on recent screening mammogram   EXAM: DIGITAL DIAGNOSTIC UNILATERAL RIGHT MAMMOGRAM WITH TOMOSYNTHESIS AND CAD; ULTRASOUND RIGHT BREAST LIMITED   TECHNIQUE: Right digital diagnostic mammography and breast tomosynthesis was performed. The images were evaluated with computer-aided detection. ; Targeted ultrasound examination of the  right breast was performed   COMPARISON:  Previous exam(s).   ACR Breast Density Category c: The breasts are  heterogeneously dense, which may obscure small masses.   FINDINGS: CC and MLO spot magnification views of small 4 mm group of calcifications in the upper outer right breast middle depth demonstrate 3 linear calcifications favored to represent early secretory or potentially early vascular calcifications which are probably benign.   On focal spot compression magnification views there is a persistent 6 mm fat centered area of architectural distortion in the upper-outer right breast middle to posterior depth 6-7 cm from the nipple. An ultrasound is recommended for further evaluation.   Sonography of the upper and lateral right breast was performed by the sonographer and myself. There are several benign intramammary nodes. However, no definite correlate for the distortion seen in the upper outer right breast.   IMPRESSION: Indeterminate 6 mm area of architectural distortion in the upper outer right breast middle to posterior depth.   4 mm group of probably benign early secretory or early vascular calcifications in the upper outer right breast middle depth.   RECOMMENDATION: Stereotactic guided biopsy of the architectural distortion in the upper-outer right breast is recommended.   If the above biopsy comes back as benign then six-month follow-up of the probably benign calcifications in the upper outer right breast middle depth is recommended. If the biopsy comes back as malignancy then consideration for stereotactic biopsy of the probably benign calcifications is recommended.   I have discussed the findings and recommendations with the patient. If applicable, a reminder letter will be sent to the patient regarding the next appointment.   BI-RADS CATEGORY  4: Suspicious.     Electronically Signed   By: Rosina Gelineau M.D.   On: 02/11/2024 11:21  DIGITAL SCREENING BILATERAL MAMMOGRAM WITH TOMOSYNTHESIS AND CAD   TECHNIQUE: Bilateral screening digital craniocaudal and  mediolateral oblique mammograms were obtained. Bilateral screening digital breast tomosynthesis was performed. The images were evaluated with computer-aided detection.   COMPARISON:  Previous exam(s).   ACR Breast Density Category c: The breasts are heterogeneously dense, which may obscure small masses.   FINDINGS: In the right breast, a possible asymmetry and separate calcifications warrant further evaluation. In the left breast, no findings suspicious for malignancy.   IMPRESSION: Further evaluation is suggested for possible asymmetry and separate calcifications in the right breast.   RECOMMENDATION: Diagnostic mammogram and possibly ultrasound of the right breast. (Code:FI-R-40M)   The patient will be contacted regarding the findings, and additional imaging will be scheduled.   BI-RADS CATEGORY  0: Incomplete: Need additional imaging evaluation.     Electronically Signed   By: Corean Salter M.D.   On: 01/28/2024 08:36   Assessment and Plan:     Diagnoses and all orders for this visit:  Breast cancer, stage 1, right (CMS/HHS-HCC)    Refer to medical and radiation oncology  Discussed breast conserving surgery versus mastectomy with reconstruction.  Discussed the role of sentinel lymph node mapping especially of the age of 63 with low-grade tumors  Discussed potential complications of lymphedema and shoulder stiffness  She wishes to proceed with right breast seed lumpectomy alone.  Risks and benefits of surgery reviewed as well as the omission of sentinel node mapping.  Discussed data relaying potential rates of lymph node disease in this circumstance being under 8%.  In most cases, this will not change her overall treatment plan.The procedure has been discussed with the patient. Alternatives to surgery have  been discussed with the patient.  Risks of surgery include bleeding,  Infection,  Seroma formation, death,  and the need for further surgery.   The patient  understands and wishes to proceed.     DEBBY CURTISTINE SHIPPER, MD    I spent a total of 48 minutes in both face-to-face and non-face-to-face activities, excluding procedures performed, for this visit on the date of this encounter.      "

## 2024-03-07 ENCOUNTER — Telehealth: Payer: Self-pay | Admitting: *Deleted

## 2024-03-08 ENCOUNTER — Ambulatory Visit: Payer: Self-pay | Admitting: Surgery

## 2024-03-08 DIAGNOSIS — N6091 Unspecified benign mammary dysplasia of right breast: Secondary | ICD-10-CM

## 2024-03-10 ENCOUNTER — Other Ambulatory Visit: Payer: Self-pay | Admitting: Surgery

## 2024-03-10 DIAGNOSIS — C50911 Malignant neoplasm of unspecified site of right female breast: Secondary | ICD-10-CM

## 2024-03-18 ENCOUNTER — Ambulatory Visit (INDEPENDENT_AMBULATORY_CARE_PROVIDER_SITE_OTHER): Payer: Medicare PPO | Admitting: Internal Medicine

## 2024-03-18 ENCOUNTER — Encounter: Payer: Self-pay | Admitting: Internal Medicine

## 2024-03-18 VITALS — BP 120/72 | HR 68 | Temp 98.4°F | Ht 66.0 in | Wt 187.6 lb

## 2024-03-18 DIAGNOSIS — Z0000 Encounter for general adult medical examination without abnormal findings: Secondary | ICD-10-CM | POA: Diagnosis not present

## 2024-03-18 DIAGNOSIS — D1803 Hemangioma of intra-abdominal structures: Secondary | ICD-10-CM | POA: Diagnosis not present

## 2024-03-18 DIAGNOSIS — F330 Major depressive disorder, recurrent, mild: Secondary | ICD-10-CM | POA: Diagnosis not present

## 2024-03-18 DIAGNOSIS — D518 Other vitamin B12 deficiency anemias: Secondary | ICD-10-CM | POA: Diagnosis not present

## 2024-03-18 DIAGNOSIS — E039 Hypothyroidism, unspecified: Secondary | ICD-10-CM

## 2024-03-18 DIAGNOSIS — I10 Essential (primary) hypertension: Secondary | ICD-10-CM

## 2024-03-18 DIAGNOSIS — F411 Generalized anxiety disorder: Secondary | ICD-10-CM | POA: Diagnosis not present

## 2024-03-18 DIAGNOSIS — R7301 Impaired fasting glucose: Secondary | ICD-10-CM

## 2024-03-18 DIAGNOSIS — E782 Mixed hyperlipidemia: Secondary | ICD-10-CM

## 2024-03-18 DIAGNOSIS — E2839 Other primary ovarian failure: Secondary | ICD-10-CM

## 2024-03-18 LAB — CBC
HCT: 40.6 % (ref 36.0–46.0)
Hemoglobin: 13.7 g/dL (ref 12.0–15.0)
MCHC: 33.7 g/dL (ref 30.0–36.0)
MCV: 89.4 fl (ref 78.0–100.0)
Platelets: 244 K/uL (ref 150.0–400.0)
RBC: 4.54 Mil/uL (ref 3.87–5.11)
RDW: 14.9 % (ref 11.5–15.5)
WBC: 5 K/uL (ref 4.0–10.5)

## 2024-03-18 LAB — HEMOGLOBIN A1C: Hgb A1c MFr Bld: 5.8 % (ref 4.6–6.5)

## 2024-03-18 LAB — COMPREHENSIVE METABOLIC PANEL WITH GFR
ALT: 10 U/L (ref 3–35)
AST: 30 U/L (ref 5–37)
Albumin: 4 g/dL (ref 3.5–5.2)
Alkaline Phosphatase: 68 U/L (ref 39–117)
BUN: 22 mg/dL (ref 6–23)
CO2: 29 meq/L (ref 19–32)
Calcium: 9.2 mg/dL (ref 8.4–10.5)
Chloride: 107 meq/L (ref 96–112)
Creatinine, Ser: 0.74 mg/dL (ref 0.40–1.20)
GFR: 79.96 mL/min
Glucose, Bld: 95 mg/dL (ref 70–99)
Potassium: 3.9 meq/L (ref 3.5–5.1)
Sodium: 140 meq/L (ref 135–145)
Total Bilirubin: 0.6 mg/dL (ref 0.2–1.2)
Total Protein: 7.4 g/dL (ref 6.0–8.3)

## 2024-03-18 LAB — LIPID PANEL
Cholesterol: 162 mg/dL (ref 28–200)
HDL: 59.6 mg/dL
LDL Cholesterol: 92 mg/dL (ref 10–99)
NonHDL: 102
Total CHOL/HDL Ratio: 3
Triglycerides: 48 mg/dL (ref 10.0–149.0)
VLDL: 9.6 mg/dL (ref 0.0–40.0)

## 2024-03-18 LAB — VITAMIN B12: Vitamin B-12: 762 pg/mL (ref 211–911)

## 2024-03-18 NOTE — Assessment & Plan Note (Signed)
Checking B12 and adjust as needed. 

## 2024-03-18 NOTE — Assessment & Plan Note (Signed)
 In remission on cymbalta  and was recurrent. Continue cymbalta .

## 2024-03-18 NOTE — Assessment & Plan Note (Signed)
 BP at goal checking CMP and CBC and lipid panel and adjust as needed. Continue.

## 2024-03-18 NOTE — Assessment & Plan Note (Signed)
 Flu shot yearly. Pneumonia complete. Shingrix complete. Tetanus up to date. Colonoscopy up to date. Mammogram up to date, pap smear aged out and dexa due ordered. Counseled about sun safety and mole surveillance. Counseled about the dangers of distracted driving. Given 10 year screening recommendations.

## 2024-03-18 NOTE — Assessment & Plan Note (Signed)
Checking lipid panel and adjust simvastatin as needed.  

## 2024-03-18 NOTE — Progress Notes (Signed)
" ° °  Subjective:   Patient ID: [name], female    DOB: 30-Jul-1950, 74 y.o.   MRN: [id number]  The patient is here for physical. Pertinent topics discussed: Discussed the use of AI scribe software for clinical note transcription with the patient, who gave verbal consent to proceed.  History of Present Illness [name] is a 74 year old female with breast cancer who presents for a pre-surgical consultation and medication review.  She is scheduled for surgery on January 20th and is discussing post-operative follow-up plans.   She experiences neck pain on the left side, described as a cramp-like sensation, occurring three times and believed to be muscular in origin. Tylenol  and muscle patches have been effective for relief, with the pain typically lasting five to six days.  She reports intermittent swelling in her ankle and foot, which seems to come and go.  PMH, [facility], social history reviewed and updated  Review of Systems  Constitutional: Negative.   HENT: Negative.    Eyes: Negative.   Respiratory:  Negative for cough, chest tightness and shortness of breath.   Cardiovascular:  Negative for chest pain, palpitations and leg swelling.  Gastrointestinal:  Negative for abdominal distention, abdominal pain, constipation, diarrhea, nausea and vomiting.  Musculoskeletal:  Positive for arthralgias, myalgias and neck pain.  Skin: Negative.   Neurological: Negative.   Psychiatric/Behavioral: Negative.      Objective:  Physical Exam Constitutional:      Appearance: She is well-developed.  HENT:     Head: Normocephalic and atraumatic.  Cardiovascular:     Rate and Rhythm: Normal rate and regular rhythm.  Pulmonary:     Effort: Pulmonary effort is normal. No respiratory distress.     Breath sounds: Normal breath sounds. No wheezing or rales.  Abdominal:     General: Bowel sounds are normal. There is no distension.     Palpations: Abdomen is soft.     Tenderness: There is  no abdominal tenderness.  Musculoskeletal:        General: Tenderness present.     Cervical back: Normal range of motion.  Skin:    General: Skin is warm and dry.  Neurological:     Mental Status: She is alert and oriented to person, place, and time.     Coordination: Coordination normal.     Vitals:   03/18/24 0959  BP: 120/72  Pulse: 68  Temp: 98.4 F (36.9 C)  TempSrc: Oral  SpO2: 97%  Weight: 187 lb 9.6 oz (85.1 kg)  Height: 5' 6 (1.676 m)    Assessment & Plan:   "

## 2024-03-18 NOTE — Assessment & Plan Note (Signed)
 We discussed again these results and lack of need for follow up imaging. Checking CMP.

## 2024-03-18 NOTE — Assessment & Plan Note (Signed)
 Checking HgA1c and adjust as needed.

## 2024-03-18 NOTE — Assessment & Plan Note (Signed)
 Recent labs with endo stable.

## 2024-03-18 NOTE — Assessment & Plan Note (Signed)
 Taking cymbalta  and controlled. Will continue.

## 2024-03-20 ENCOUNTER — Telehealth: Payer: Self-pay

## 2024-03-20 NOTE — Telephone Encounter (Signed)
 Copied from CRM [id number]. Topic: Clinical - Lab/Test Results >> Mar 20, 2024  1:49 PM Nessti S wrote: Reason for CRM: pt called about lab results. She would like a call back when the lab results are ready.

## 2024-03-20 NOTE — Progress Notes (Signed)
 New Breast Cancer Diagnosis: Right Breast UOQ  Patient presented for mammogram which showed 2 areas in the right upper outer quadrant of the breast.  Histology per Pathology Report: grade 2, Invasive Ductal Carcinoma  Receptor Status: ER(positive), PR (positive), Her2-neu (negative), Ki-(5%)  Surgeon and surgical plan, if any:  Dr. Vanderbilt -Right Breast lumpectomy with radioactive seed localization  Medical oncologist, treatment if any:    Family History of Breast/Ovarian/Prostate Cancer:   Lymphedema issues, if any:      Pain issues, if any:   No  SAFETY ISSUES: Prior radiation? No Pacemaker/ICD? No Possible current pregnancy? Postmenopausal Is the patient on methotrexate? No  Current Complaints / other details:    BP (!) 134/59 (Cuff Size: Large)   Pulse 72   Temp (!) 97.4 F (36.3 C)   Resp 18   Ht 5' 6 (1.676 m)   Wt 190 lb (86.2 kg)   SpO2 95%   BMI 30.67 kg/m

## 2024-03-21 ENCOUNTER — Ambulatory Visit: Payer: Self-pay | Admitting: Internal Medicine

## 2024-03-24 ENCOUNTER — Other Ambulatory Visit: Payer: Self-pay

## 2024-03-24 ENCOUNTER — Encounter (HOSPITAL_BASED_OUTPATIENT_CLINIC_OR_DEPARTMENT_OTHER): Payer: Self-pay

## 2024-03-24 DIAGNOSIS — C50411 Malignant neoplasm of upper-outer quadrant of right female breast: Secondary | ICD-10-CM | POA: Insufficient documentation

## 2024-03-24 NOTE — Progress Notes (Signed)
 " Radiation Oncology         [phone] ________________________________  Name: [name]        MRN: [id number]  Date of Service: 03/25/2024 DOB: Mar 12, 1951  RR:[id number], Almarie LABOR, MD  Vanderbilt Ned, MD     REFERRING PHYSICIAN: Vanderbilt Ned, MD   DIAGNOSIS: The encounter diagnosis was Malignant neoplasm of upper-outer quadrant of right breast in female, estrogen receptor positive (HCC).   HISTORY OF PRESENT ILLNESS: [name] is a 74 y.o. female seen in the multidisciplinary breast clinic for a new diagnosis of right breast cancer. The patient was noted to have a screening detected asymmetry and calcifications separately in the right breast she returned on 02/11/2024 for diagnostic imaging and mammographically there was a 6 mm area of architectural distortion in the upper outer quadrant of the right breast a separate group of calcifications in the upper outer quadrant measured 4 mm.  By ultrasound, the asymmetry could not be more concisely characterized but did show several benign inframammary lymph nodes, she underwent a stereotactic biopsy of the asymmetry, and separately of calcifications.  The stereotactic breast biopsy on 02/20/24 showed a grade 2 invasive lobular carcinoma that was ER/PR positive HER2 negative with a Ki 67 of 5%. A stereotactic biopsy on 02/27/24 of the calcifications in the right breast showed focal atypical ductal hyperplasia with associated calcifications. She is scheduled to undergo a lumpectomy on 04/01/2024 and is seen to discuss adjuvant treatments.    PREVIOUS RADIATION THERAPY: {EXAM; YES/NO:19492::No}   PAST MEDICAL HISTORY:  Past Medical History:  Diagnosis Date   Anxiety    Anxiety state 03/05/2023   Arthritis    Blood in stool    Colon polyps    Hyperlipidemia    Hypertension    Thyroid  disease        PAST SURGICAL HISTORY: Past Surgical History:  Procedure Laterality Date   ABDOMINAL HYSTERECTOMY     BREAST BIOPSY  Right 02/20/2024   MM RT BREAST BX W LOC DEV 1ST LESION IMAGE BX SPEC STEREO GUIDE 02/20/2024 [facility]   BREAST BIOPSY Right 02/27/2024   MM RT BREAST BX W LOC DEV 1ST LESION IMAGE BX SPEC STEREO GUIDE 02/27/2024 [facility]   KNEE SURGERY Left 2018   arthroscopy   TOTAL KNEE ARTHROPLASTY Right 11/17/2022   Procedure: TOTAL KNEE ARTHROPLASTY;  Surgeon: Kay Kemps, MD;  Location: [facility];  Service: Orthopedics;  Laterality: Right;     FAMILY HISTORY:  Family History  Problem Relation Age of Onset   Hypertension Mother    Breast cancer Sister    Arthritis Sister    Cancer - Colon Sister    Diabetes Sister    Breast cancer Sister    Hypertension Paternal Grandmother    Diabetes Paternal Aunt      SOCIAL HISTORY:  reports that she has never smoked. She has never been exposed to tobacco smoke. She has never used smokeless tobacco. She reports that she does not drink alcohol and does not use drugs. The patient is married and lives in [place]. She ***   ALLERGIES: Patient has no known allergies.   MEDICATIONS:  Current Outpatient Medications  Medication Sig Dispense Refill   Ascorbic Acid  (VITAMIN C  PO) Take 1 tablet by mouth in the morning.     B Complex-C (SUPER B COMPLEX PO) Take 1 tablet by mouth in the morning.     carboxymethylcellulose (REFRESH PLUS) 0.5 % SOLN Place 1 drop into both eyes  3 (three) times daily as needed (dry/irritated eyes.).     Cholecalciferol  (VITAMIN D -3 PO) Take 1 tablet by mouth in the morning.     docusate sodium  (COLACE) 100 MG capsule Take 100 mg by mouth daily.     DULoxetine  (CYMBALTA ) 60 MG capsule Take 1 capsule (60 mg total) by mouth daily. 90 capsule 3   fluticasone  (FLONASE ) 50 MCG/ACT nasal spray Place 2 sprays into both nostrils daily. 16 g 3   losartan -hydrochlorothiazide  (HYZAAR) 50-12.5 MG tablet Take 1 tablet by mouth daily. 90 tablet 3   Multiple Vitamins-Minerals (MULTIVITAMIN WITH MINERALS) tablet Take 1 tablet  by mouth in the morning.     neomycin -polymyxin-hydrocortisone  (CORTISPORIN) OTIC solution Place 3 drops into both ears 3 (three) times daily. 10 mL 0   pantoprazole  (PROTONIX ) 40 MG tablet Take 1 tablet (40 mg total) by mouth daily. (Patient not taking: Reported on 03/18/2024) 30 tablet 3   simvastatin  (ZOCOR ) 20 MG tablet Take 1 tablet (20 mg total) by mouth daily at 6 PM. 90 tablet 3   SYNTHROID  88 MCG tablet Take 88 mcg by mouth daily before breakfast.     traZODone  (DESYREL ) 50 MG tablet TAKE 1 AND 1/2 TABLETS AT BEDTIME 135 tablet 3   No current facility-administered medications for this visit.     REVIEW OF SYSTEMS: On review of systems, the patient reports that she is doing ***     PHYSICAL EXAM:  Wt Readings from Last 3 Encounters:  03/18/24 187 lb 9.6 oz (85.1 kg)  02/12/24 188 lb 9.6 oz (85.5 kg)  01/30/24 190 lb 9.6 oz (86.5 kg)   Temp Readings from Last 3 Encounters:  03/18/24 98.4 F (36.9 C) (Oral)  01/30/24 97.7 F (36.5 C) (Oral)  11/06/23 98 F (36.7 C) (Temporal)   BP Readings from Last 3 Encounters:  03/18/24 120/72  02/12/24 122/80  01/30/24 128/82   Pulse Readings from Last 3 Encounters:  03/18/24 68  02/12/24 69  01/30/24 71    In general this is a well appearing *** female in no acute distress. She's alert and oriented x4 and appropriate throughout the examination. Cardiopulmonary assessment is negative for acute distress and she exhibits normal effort. Bilateral breast exam is deferred.    ECOG = ***  0 - Asymptomatic (Fully active, able to carry on all predisease activities without restriction)  1 - Symptomatic but completely ambulatory (Restricted in physically strenuous activity but ambulatory and able to carry out work of a light or sedentary nature. For example, light housework, office work)  2 - Symptomatic, <50% in bed during the day (Ambulatory and capable of all self care but unable to carry out any work activities. Up and about more  than 50% of waking hours)  3 - Symptomatic, >50% in bed, but not bedbound (Capable of only limited self-care, confined to bed or chair 50% or more of waking hours)  4 - Bedbound (Completely disabled. Cannot carry on any self-care. Totally confined to bed or chair)  5 - Death   Raylene MM, Creech RH, Tormey DC, et al. [id number]). Toxicity and response criteria of the [facility]. Am. DOROTHA Bridges. Oncol. 5 (6): 649-55    LABORATORY DATA:  Lab Results  Component Value Date   WBC 5.0 03/18/2024   HGB 13.7 03/18/2024   HCT 40.6 03/18/2024   MCV 89.4 03/18/2024   PLT 244.0 03/18/2024   Lab Results  Component Value Date   NA 140 03/18/2024   K  3.9 03/18/2024   CL 107 03/18/2024   CO2 29 03/18/2024   Lab Results  Component Value Date   ALT 10 03/18/2024   AST 30 03/18/2024   ALKPHOS 68 03/18/2024   BILITOT 0.6 03/18/2024      RADIOGRAPHY: MM RT BREAST BX W LOC DEV 1ST LESION IMAGE BX SPEC STEREO GUIDE Addendum Date: 02/28/2024 ADDENDUM REPORT: 02/28/2024 14:50 ADDENDUM: PATHOLOGY revealed: Breast, right, needle core biopsy, upper outer quadrant (coil clip)- FOCAL ATYPICAL DUCTAL HYPERPLASIA WITH ASSOCIATED CALCIFICATIONS. Pathology results are CONCORDANT with imaging findings, per Dr. Aliene Mir. Pathology results and recommendations below were discussed with patient by telephone. Patient reported biopsy site doing well with no adverse symptoms, and only slight tenderness at the site. Post biopsy care instructions were reviewed, questions were answered and my direct phone number was provided. Patient was instructed to call [facility] for any additional questions or concerns related to biopsy site. RECOMMENDATION: Patient has surgical consult scheduled with Dr. Debby Shipper on 02/29/24 for recently diagnosed Right breast cancer. Pathology results reported by Mliss CHARM Molt RN 02/28/2024. Electronically Signed   By: Aliene Lloyd M.D.   On:  02/28/2024 14:50   Result Date: 02/28/2024 CLINICAL DATA:  74 year old woman underwent stereotactic guided biopsy of architectural distortion in the RIGHT upper outer breast on 02/20/2024, which demonstrated invasive mammary carcinoma. She returns today for biopsy of 4 mm group of calcifications in the upper-outer quadrant of the RIGHT breast. EXAM: RIGHT BREAST STEREOTACTIC CORE NEEDLE BIOPSY COMPARISON:  Previous exam(s). FINDINGS: The patient and I discussed the procedure of stereotactic-guided biopsy including benefits and alternatives. We discussed the high likelihood of a successful procedure. We discussed the risks of the procedure including infection, bleeding, tissue injury, clip migration, and inadequate sampling. Informed written consent was given. The usual time out protocol was performed immediately prior to the procedure. Using sterile technique and 1% Lidocaine  as local anesthetic, under stereotactic guidance, a 9 gauge vacuum assisted device was used to perform core needle biopsy of calcifications in the upper outer quadrant of the RIGHT breast using a lateral approach. Specimen radiograph was performed showing calcifications. Specimens with calcifications are identified for pathology. Lesion quadrant: Upper outer quadrant At the conclusion of the procedure, coil shaped tissue marker clip was deployed into the biopsy cavity. Follow-up 2-view mammogram was performed and dictated separately. IMPRESSION: Stereotactic-guided biopsy of 4 mm group of RIGHT upper outer breast calcifications. No apparent complications. Electronically Signed: By: Aliene Lloyd M.D. On: 02/27/2024 12:34   MM CLIP PLACEMENT RIGHT Result Date: 02/27/2024 CLINICAL DATA:  Status post stereotactic guided biopsy of RIGHT breast calcifications EXAM: 3D DIAGNOSTIC RIGHT MAMMOGRAM POST STEREOTACTIC BIOPSY COMPARISON:  Previous exam(s). ACR Breast Density Category c: The breasts are heterogeneously dense, which may obscure small  masses. FINDINGS: 3D Mammographic images were obtained following stereotactic guided biopsy of RIGHT upper outer breast calcifications. The biopsy marking clip is in expected position at the site of biopsy. IMPRESSION: Appropriate positioning of the coil shaped biopsy marking clip at the site of biopsy in the upper-outer RIGHT breast. Final Assessment: Post Procedure Mammograms for Marker Placement Electronically Signed   By: Aliene Lloyd M.D.   On: 02/27/2024 12:35       IMPRESSION/PLAN: 1. At least Stage IA, [id number], grade 2 ER/PR positive invaisve lobular carcinoma of the right breast. Dr. Dewey discusses the pathology findings and reviews the nature of *** breast disease. The consensus from the breast conference includes breast conservation with lumpectomy  with *** sentinel node biopsy. Depending on the size of the final tumor measurements rendered by pathology, the tumor may be tested for Oncotype Dx score to determine a role for systemic therapy. Provided that chemotherapy is not indicated, the patient's course would then be followed by external radiotherapy to the breast  to reduce risks of local recurrence. Dr. PIERRETTE anticipates adjuvant antiestrogen therapy to follow. We discussed the risks, benefits, short, and long term effects of radiotherapy, as well as the curative intent, and the patient is interested in proceeding. Dr. Dewey discusses the delivery and logistics of radiotherapy and anticipates a course of *** weeks of radiotherapy. We will see her back a few weeks after surgery to discuss the simulation process and anticipate we starting radiotherapy about 4-6 weeks after surgery.  2. Possible genetic predisposition to malignancy. The patient is a candidate for genetic testing given her personal and family history. She ****   In a visit lasting *** minutes, greater than 50% of the time was spent face to face reviewing her case, as well as in preparation of, discussing, and coordinating the  patient's care.  The above documentation reflects my direct findings during this shared patient visit. Please see the separate note by Dr. Dewey on this date for the remainder of the patient's plan of care.    Donald KYM Husband, [facility]    **Disclaimer: This note was dictated with voice recognition software. Similar sounding words can inadvertently be transcribed and this note may contain transcription errors which may not have been corrected upon publication of note.** "

## 2024-03-25 ENCOUNTER — Ambulatory Visit
Admission: RE | Admit: 2024-03-25 | Discharge: 2024-03-25 | Disposition: A | Discharge status: Home / Self Care | Source: Ambulatory Visit | Attending: Radiation Oncology | Admitting: Radiation Oncology

## 2024-03-25 ENCOUNTER — Inpatient Hospital Stay: Admitting: Hematology and Oncology

## 2024-03-25 ENCOUNTER — Inpatient Hospital Stay: Attending: Hematology and Oncology

## 2024-03-25 ENCOUNTER — Encounter: Payer: Self-pay | Admitting: Radiation Oncology

## 2024-03-25 VITALS — BP 134/59 | HR 72 | Temp 97.4°F | Resp 18 | Ht 66.0 in | Wt 190.4 lb

## 2024-03-25 VITALS — BP 134/59 | HR 72 | Temp 97.4°F | Resp 18 | Ht 66.0 in | Wt 190.0 lb

## 2024-03-25 DIAGNOSIS — Z79890 Hormone replacement therapy (postmenopausal): Secondary | ICD-10-CM | POA: Insufficient documentation

## 2024-03-25 DIAGNOSIS — Z800 Family history of malignant neoplasm of digestive organs: Secondary | ICD-10-CM | POA: Insufficient documentation

## 2024-03-25 DIAGNOSIS — Z860101 Personal history of adenomatous and serrated colon polyps: Secondary | ICD-10-CM | POA: Insufficient documentation

## 2024-03-25 DIAGNOSIS — E785 Hyperlipidemia, unspecified: Secondary | ICD-10-CM | POA: Insufficient documentation

## 2024-03-25 DIAGNOSIS — Z803 Family history of malignant neoplasm of breast: Secondary | ICD-10-CM | POA: Insufficient documentation

## 2024-03-25 DIAGNOSIS — Z170 Estrogen receptor positive status [ER+]: Secondary | ICD-10-CM | POA: Insufficient documentation

## 2024-03-25 DIAGNOSIS — I10 Essential (primary) hypertension: Secondary | ICD-10-CM | POA: Insufficient documentation

## 2024-03-25 DIAGNOSIS — N6091 Unspecified benign mammary dysplasia of right breast: Secondary | ICD-10-CM | POA: Diagnosis not present

## 2024-03-25 DIAGNOSIS — Z1732 Human epidermal growth factor receptor 2 negative status: Secondary | ICD-10-CM | POA: Insufficient documentation

## 2024-03-25 DIAGNOSIS — Z79899 Other long term (current) drug therapy: Secondary | ICD-10-CM | POA: Insufficient documentation

## 2024-03-25 DIAGNOSIS — C50411 Malignant neoplasm of upper-outer quadrant of right female breast: Secondary | ICD-10-CM

## 2024-03-25 DIAGNOSIS — Z1721 Progesterone receptor positive status: Secondary | ICD-10-CM | POA: Diagnosis not present

## 2024-03-25 DIAGNOSIS — M129 Arthropathy, unspecified: Secondary | ICD-10-CM | POA: Insufficient documentation

## 2024-03-25 LAB — RESEARCH LABS

## 2024-03-25 NOTE — Assessment & Plan Note (Signed)
 02/20/2024: Screening mammogram detected right breast architectural distortion 6 mm and 4 mm, biopsy UOQ: Grade 2 ILC, ER 90%, PR 40%, Ki-67 5%, HER2 0 negative, biopsy the second calcifications: Focal ADH  Pathology and radiology counseling:Discussed with the patient, the details of pathology including the type of breast cancer,the clinical staging, the significance of ER, PR and HER-2/neu receptors and the implications for treatment. After reviewing the pathology in detail, we proceeded to discuss the different treatment options between surgery, radiation, chemotherapy, antiestrogen therapies.  Recommendations: 1. Breast conserving surgery followed by 2. Oncotype DX testing to determine if chemotherapy would be of any benefit followed by 3. Adjuvant radiation therapy followed by 4. Adjuvant antiestrogen therapy  Oncotype counseling: I discussed Oncotype DX test. I explained to the patient that this is a 21 gene panel to evaluate patient tumors DNA to calculate recurrence score. This would help determine whether patient has high risk or low risk breast cancer. She understands that if her tumor was found to be high risk, she would benefit from systemic chemotherapy. If low risk, no need of chemotherapy.  Return to clinic after surgery to discuss final pathology report and then determine if Oncotype DX testing will need to be sent.

## 2024-03-25 NOTE — Research (Signed)
 [employer] 2021-05 - Specimen Collection Study to Evaluate Biomarkers in Subjects with Cancer    This Nurse has reviewed this patient's inclusion and exclusion criteria as a second review and confirms [name] is eligible for study participation.  Patient may continue with enrollment.  Delon Pinal BSN RN Clinical Research Nurse [facility] Direct Dial: [phone] 03/25/2024  12:18 PM

## 2024-03-25 NOTE — Research (Signed)
 [employer] 2021-05 - Specimen Collection Study to Evaluate Biomarkers in Subjects with Cancer      Patient [name] was identified by Dr. Gudena as a potential candidate for the above listed study.  This Clinical Research Coordinator met with [name], [id number] on 03/25/2024 in a manner and location that ensures patient privacy to discuss participation in the above listed research study.  Patient is Unaccompanied.  Patient was  provided with informed consent documents.    As outlined in the informed consent form, this Coordinator and [name] discussed the purpose of the research study, the investigational nature of the study, study procedures and requirements for study participation, potential risks and benefits of study participation, as well as alternatives to participation.  This study is not blinded or double-blinded. The patient understands participation is voluntary and they may withdraw from study participation at any time.  This study does not involve randomization.  This study does not involve an investigational drug or device. This study does not involve a placebo. Patient understands enrollment is pending full eligibility review.   Confidentiality and how the patient's information will be used as part of study participation were discussed.  Patient was informed there is reimbursement provided for their time and effort spent on trial participation.  The patient is encouraged to discuss research study participation with their insurance provider to determine what costs they may incur as part of study participation, including research related injury.    All questions were answered to patient's satisfaction.  The informed consent with embedded HIPAA language was reviewed page by page.  The patient's mental and emotional status is appropriate to provide informed consent, and the patient verbalizes an understanding of study participation.  Patient has agreed to  participate in the above listed research study and has voluntarily signed the informed consent version 13 Nov 2023 with embedded HIPAA language, version 13 Nov 2023 on 03/25/2024 at 12:05PM.  The patient was provided with a copy of the signed informed consent form with embedded HIPAA language for their reference.  No study specific procedures were obtained prior to the signing of the informed consent document.  Approximately 15 minutes were spent with the patient reviewing the informed consent documents.  Patient was not requested to complete a Release of Information form.   Eligibility: Eligibility criteria reviewed with patient. This nurse/coordinator has reviewed this patient's inclusion and exclusion criteria and confirmed patient is eligible for study participation. Eligibility confirmed by treating investigator, Dr. Gudena , who also agrees that patient should proceed with enrollment.  Patient will continue with enrollment.  Medical History: This RN/Coordinator reviewed the medical history as reported in the patient's medical record with the participant.  In addition, the participant was asked to report any new medical conditions not previously recorded on the medical history form.   Was the current medical history form correct?   Yes Are there any new medical conditions to report?  No    Based on the review of the medical chart and the patient's responses, all reportable medical history events will be entered for study reporting purposes.     Data Collection: Patient was interviewed to collect the following information.  Does participant have a history of: High Blood Pressure   Yes  Has participant been diagnosed with: Coronary Artery Disease                No Myocardial Infarction  No Congestive Heart Failure               No Peripheral Vascular Disease          No Cerebrovascular Disease              No Chronic Pulmonary Disease             No COPD (incl  Emphysema,Chronic Bronchitis)    No Lupus                               No Rheumatoid Arthritis         No Rheumatoid Disease         No  Does participant have a history of: Diabetes                  No            Has participant been diagnosed with: Dementia                         No Hemiplegia or Paraplegia No Barrett's Esophagus       No Gastric Ulcer                 No Peptic Ulcer Disease      No Mild Liver Disease           No Moderate or Severe Liver Disease  No Liver Cirrhosis                                 No Helicobacter Pylori (H. Pylori)          No Pancreatitis                                       No Renal Disease                                No Chronic Kidney Disease (CKD)   No  Ulcerative Colitis     No Crohn's Disease    No Colorectal Polyps   No Lynch Syndrome    No Hepatitis B or C     No   Is the participant currently taking a magnesium supplement?   No  Does the participant have a personal history of cancer (greater than 5 years ago)?  No  Does the participant have a family history of cancer in 1st or 2nd degree relatives? Yes If yes, Relationship(s) and Cancer type(s)? Sister- breast; sister-colon  Does the participant have history of alcohol consumption? Yes   If yes, current or former? current  Number of years? Not sure; maybe since 21 to current Drinks per week? .01  Does the participant have a history of cigarette, cigar, pipe, or chewing tobacco use?  Yes  If yes, current for former? former If yes, type (Cigarette, cigar, pipe, and/or chewing tobacco)? cigarette   If former, year stopped? 74 yrs old Number of years? 1 Packs/number/containers per day? .01  Blood Collection: Research blood obtained by fresh venipuncture (or Port a Cath per patient's preference). Patient tolerated well without any adverse events.  Gift Card: $50 gift card given to patient by Clinical Research Specialist Almarie Harden for  her participation in this  study.      Patient was thanked for their participation in this study.

## 2024-03-25 NOTE — Progress Notes (Signed)
 [facility] CONSULT NOTE  Patient Care Team: Rollene Almarie LABOR, MD as PCP - General (Internal Medicine) Kristie Lamprey, MD as Consulting Physician (Gastroenterology) Claudene Elsie JUDITHANN Mickey., MD as Consulting Physician (Endocrinology) Robinson Mayo, OD as Referring Physician (Optometry)  CHIEF COMPLAINTS/PURPOSE OF CONSULTATION:  Newly diagnosed breast cancer  HISTORY OF PRESENTING ILLNESS:    History of Present Illness [name] is a 74 year old female with newly diagnosed stage IA right breast invasive lobular carcinoma who presents for initial oncology consultation and treatment planning.  A screening mammogram found two right breast abnormalities. Biopsy showed a 0.6 cm invasive lobular carcinoma and atypical ductal hyperplasia. She is scheduled for right breast lumpectomy on April 01, 2024 and has not had surgery or systemic therapy.  She has no palpable mass, nipple discharge, or skin changes. She has mild breast pain and significant anxiety about the diagnosis. She denies fever, weight loss, and night sweats.  Pathology shows ER 90% positive, PR 40% positive, HER2 negative, and Ki-67 5%. She has not started endocrine therapy or other cancer-directed medications.  She is eligible for a research study requiring a one-time blood sample for early breast cancer detection and for genetic testing due to a sister with breast cancer.     I reviewed her records extensively and collaborated the history with the patient.  SUMMARY OF ONCOLOGIC HISTORY: Oncology History  Malignant neoplasm of upper-outer quadrant of right breast in female, estrogen receptor positive (HCC)  02/20/2024 Initial Diagnosis   Screening mammogram detected right breast architectural distortion 6 mm and 4 mm, biopsy UOQ: Grade 2 ILC, ER 90%, PR 40%, Ki-67 5%, HER2 0 negative, biopsy the second calcifications: Focal ADH   03/25/2024 Cancer Staging   Staging form: Breast, AJCC 8th Edition -  Clinical: Stage IA (cT1b, cN0, cM0, G2, ER+, PR+, HER2-) - Signed by Odean Potts, MD on 03/25/2024 Stage prefix: Initial diagnosis Histologic grading system: 3 grade system      MEDICAL HISTORY:  Past Medical History:  Diagnosis Date   Anxiety    Anxiety state 03/05/2023   Arthritis    Blood in stool    Colon polyps    Hyperlipidemia    Hypertension    Thyroid  disease     SURGICAL HISTORY: Past Surgical History:  Procedure Laterality Date   ABDOMINAL HYSTERECTOMY     BREAST BIOPSY Right 02/20/2024   MM RT BREAST BX W LOC DEV 1ST LESION IMAGE BX SPEC STEREO GUIDE 02/20/2024 [facility]   BREAST BIOPSY Right 02/27/2024   MM RT BREAST BX W LOC DEV 1ST LESION IMAGE BX SPEC STEREO GUIDE 02/27/2024 [facility]   KNEE SURGERY Left 2018   arthroscopy   TOTAL KNEE ARTHROPLASTY Right 11/17/2022   Procedure: TOTAL KNEE ARTHROPLASTY;  Surgeon: Kay Kemps, MD;  Location: [facility];  Service: Orthopedics;  Laterality: Right;    SOCIAL HISTORY: Social History   Socioeconomic History   Marital status: Married    Spouse name: Lynwood   Number of children: Not on file   Years of education: Not on file   Highest education level: Not on file  Occupational History   Occupation: RETIRED  Tobacco Use   Smoking status: Never    Passive exposure: Never   Smokeless tobacco: Never  Vaping Use   Vaping status: Never Used  Substance and Sexual Activity   Alcohol use: No    Alcohol/week: 0.0 standard drinks of alcohol   Drug use: No   Sexual activity: Not on  file  Other Topics Concern   Not on file  Social History Narrative   Lives with husband/2025   Social Drivers of Health   Tobacco Use: Low Risk (03/24/2024)   Patient History    Smoking Tobacco Use: Never    Smokeless Tobacco Use: Never    Passive Exposure: Never  Recent Concern: Tobacco Use - Medium Risk (02/29/2024)   Received from [facility]   Patient History    Smoking Tobacco Use: Former     Smokeless Tobacco Use: Never    Passive Exposure: Not on file  Financial Resource Strain: Low Risk (03/05/2023)   Overall Financial Resource Strain (CARDIA)    Difficulty of Paying Living Expenses: Not hard at all  Food Insecurity: No Food Insecurity (02/12/2024)   Epic    Worried About Radiation Protection Practitioner of Food in the Last Year: Never true    Ran Out of Food in the Last Year: Never true  Transportation Needs: No Transportation Needs (02/12/2024)   Epic    Lack of Transportation (Medical): No    Lack of Transportation (Non-Medical): No  Physical Activity: Insufficiently Active (02/12/2024)   Exercise Vital Sign    Days of Exercise per Week: 3 days    Minutes of Exercise per Session: 30 min  Stress: No Stress Concern Present (02/12/2024)   [employer] of Occupational Health - Occupational Stress Questionnaire    Feeling of Stress: Not at all  Social Connections: Socially Integrated (02/12/2024)   Social Connection and Isolation Panel    Frequency of Communication with Friends and Family: More than three times a week    Frequency of Social Gatherings with Friends and Family: Once a week    Attends Religious Services: More than 4 times per year    Active Member of [employer] or Organizations: Yes    Attends Banker Meetings: More than 4 times per year    Marital Status: Married  Catering Manager Violence: Not At Risk (02/12/2024)   Epic    Fear of Current or Ex-Partner: No    Emotionally Abused: No    Physically Abused: No    Sexually Abused: No  Depression (PHQ2-9): Low Risk (03/18/2024)   Depression (PHQ2-9)    PHQ-2 Score: 0  Alcohol Screen: Low Risk (03/05/2023)   Alcohol Screen    Last Alcohol Screening Score (AUDIT): 0  Housing: Unknown (02/29/2024)   Received from [facility]   Epic    Unable to Pay for Housing in the Last Year: Not on file    Number of Times Moved in the Last Year: Not on file    At any time in the past 12 months, were you  homeless or living in a shelter (including now)?: No  Utilities: Not At Risk (02/12/2024)   Epic    Threatened with loss of utilities: No  Health Literacy: Adequate Health Literacy (02/12/2024)   B1300 Health Literacy    Frequency of need for help with medical instructions: Never    FAMILY HISTORY: Family History  Problem Relation Age of Onset   Hypertension Mother    Breast cancer Sister    Arthritis Sister    Cancer - Colon Sister    Diabetes Sister    Breast cancer Sister    Hypertension Paternal Grandmother    Diabetes Paternal Aunt     ALLERGIES:  has no known allergies.  MEDICATIONS:  Current Outpatient Medications  Medication Sig Dispense Refill   Ascorbic Acid  (VITAMIN C   PO) Take 1 tablet by mouth in the morning.     B Complex-C (SUPER B COMPLEX PO) Take 1 tablet by mouth in the morning.     carboxymethylcellulose (REFRESH PLUS) 0.5 % SOLN Place 1 drop into both eyes 3 (three) times daily as needed (dry/irritated eyes.).     Cholecalciferol  (VITAMIN D -3 PO) Take 1 tablet by mouth in the morning.     docusate sodium  (COLACE) 100 MG capsule Take 100 mg by mouth daily.     DULoxetine  (CYMBALTA ) 60 MG capsule Take 1 capsule (60 mg total) by mouth daily. 90 capsule 3   fluticasone  (FLONASE ) 50 MCG/ACT nasal spray Place 2 sprays into both nostrils daily. 16 g 3   losartan -hydrochlorothiazide  (HYZAAR) 50-12.5 MG tablet Take 1 tablet by mouth daily. 90 tablet 3   Multiple Vitamins-Minerals (MULTIVITAMIN WITH MINERALS) tablet Take 1 tablet by mouth in the morning.     neomycin -polymyxin-hydrocortisone  (CORTISPORIN) OTIC solution Place 3 drops into both ears 3 (three) times daily. 10 mL 0   pantoprazole  (PROTONIX ) 40 MG tablet Take 1 tablet (40 mg total) by mouth daily. 30 tablet 3   simvastatin  (ZOCOR ) 20 MG tablet Take 1 tablet (20 mg total) by mouth daily at 6 PM. 90 tablet 3   SYNTHROID  88 MCG tablet Take 88 mcg by mouth daily before breakfast.     traZODone  (DESYREL ) 50 MG  tablet TAKE 1 AND 1/2 TABLETS AT BEDTIME 135 tablet 3   No current facility-administered medications for this visit.    REVIEW OF SYSTEMS:   Constitutional: Denies fevers, chills or abnormal night sweats Breast:  Denies any palpable lumps or discharge All other systems were reviewed with the patient and are negative.  PHYSICAL EXAMINATION: ECOG PERFORMANCE STATUS: 0 - Asymptomatic  Vitals:   03/25/24 1125  BP: (!) 134/59  Pulse: 72  Resp: 18  Temp: (!) 97.4 F (36.3 C)  SpO2: 95%   Filed Weights   03/25/24 1125  Weight: 190 lb 6.4 oz (86.4 kg)    GENERAL:alert, no distress and comfortable    LABORATORY DATA:  I have reviewed the data as listed Lab Results  Component Value Date   WBC 5.0 03/18/2024   HGB 13.7 03/18/2024   HCT 40.6 03/18/2024   MCV 89.4 03/18/2024   PLT 244.0 03/18/2024   Lab Results  Component Value Date   NA 140 03/18/2024   K 3.9 03/18/2024   CL 107 03/18/2024   CO2 29 03/18/2024    RADIOGRAPHIC STUDIES: I have personally reviewed the radiological reports and agreed with the findings in the report.  ASSESSMENT AND PLAN:  Malignant neoplasm of upper-outer quadrant of right breast in female, estrogen receptor positive (HCC) 02/20/2024: Screening mammogram detected right breast architectural distortion 6 mm and 4 mm, biopsy UOQ: Grade 2 ILC, ER 90%, PR 40%, Ki-67 5%, HER2 0 negative, biopsy the second calcifications: Focal ADH  Pathology and radiology counseling:Discussed with the patient, the details of pathology including the type of breast cancer,the clinical staging, the significance of ER, PR and HER-2/neu receptors and the implications for treatment. After reviewing the pathology in detail, we proceeded to discuss the different treatment options between surgery, radiation, chemotherapy, antiestrogen therapies.  Recommendations: 1. Breast conserving surgery followed by 2. Adjuvant radiation therapy followed by 3. Adjuvant antiestrogen  therapy  Based upon the small size and lobular histology we did not think she would need to do Oncotype testing Assessment & Plan Stage IA invasive lobular carcinoma of the right  breast, estrogen receptor positive Newly diagnosed early-stage hormone receptor-positive invasive lobular carcinoma with favorable prognostic features. Low-risk features preclude chemotherapy. Anti-estrogen therapy indicated to reduce recurrence risk. Adjuvant radiation therapy considered postoperatively based on final pathology. - Scheduled lumpectomy on January 20th for tumor excision. - Referred to radiation oncology for consultation regarding adjuvant radiation therapy; final decision pending postoperative pathology and multidisciplinary discussion. - Discussed initiation of anti-estrogen therapy (e.g., anastrozole) for 5-7 years postoperatively to reduce recurrence risk; reviewed mechanism (estrogen suppression) and potential adverse effects (hot flashes, arthralgias, increased osteoporosis risk). - No chemotherapy planned due to low-risk features. - Ordered preoperative and oncologic blood work. - Offered participation in research study involving a one-time blood sample for early breast cancer detection. - Referred for genetic counseling and testing due to family history of breast cancer; discussed that genetic testing is a one-time, optional procedure. - Scheduled postoperative follow-up to review pathology and finalize adjuvant therapy plan.  Atypical ductal hyperplasia of the right breast Biopsy-proven atypical ductal hyperplasia, a benign but high-risk lesion for future malignancy. Management options include observation or excision; decision pending further surgical consultation. - Planned discussion with Dr. Vanderbilt regarding management (excision vs observation). - Will reassess management following surgical consultation and review of pathology.     All questions were answered. The patient knows to call the  clinic with any problems, questions or concerns. I personally spent a total of 30 minutes in the care of the patient today including preparing to see the patient, getting/reviewing separately obtained history, performing a medically appropriate exam/evaluation, counseling and educating, placing orders, referring and communicating with other health care professionals, documenting clinical information in the EHR, independently interpreting results, communicating results, and coordinating care.   Viinay K Iyannah Blake, MD 03/25/2024

## 2024-03-26 ENCOUNTER — Ambulatory Visit: Payer: Self-pay | Admitting: Surgery

## 2024-03-26 ENCOUNTER — Encounter: Payer: Self-pay | Admitting: *Deleted

## 2024-03-26 DIAGNOSIS — N6091 Unspecified benign mammary dysplasia of right breast: Secondary | ICD-10-CM

## 2024-03-26 NOTE — Progress Notes (Signed)
 Met with patient at her initial med onc appt with Dr. Gudena. Gave her navigator's contact information and resource information including Journey notebook and breast cancer book. Genetic referral has been entered. Plan for lumpectomy, xrt, and AI. Per Dr. Gara note no oncotype planned. Requested f/u appt for patient with Dr. Gudena about 2-3 post op (1/20 surgery). Research nurse into see patient and end of appt.

## 2024-03-26 NOTE — Telephone Encounter (Signed)
 "  Discussed already.  "

## 2024-03-27 ENCOUNTER — Telehealth: Payer: Self-pay | Admitting: Hematology and Oncology

## 2024-03-27 NOTE — Telephone Encounter (Signed)
 Left vm for pt about scheduled appt date and time. Encouraged to call back if need to reschedule

## 2024-03-28 ENCOUNTER — Encounter

## 2024-03-28 ENCOUNTER — Encounter (HOSPITAL_BASED_OUTPATIENT_CLINIC_OR_DEPARTMENT_OTHER)
Admission: RE | Admit: 2024-03-28 | Discharge: 2024-03-28 | Disposition: A | Discharge status: Home / Self Care | Source: Ambulatory Visit | Attending: Surgery | Admitting: Surgery

## 2024-03-28 ENCOUNTER — Ambulatory Visit
Admission: RE | Admit: 2024-03-28 | Discharge: 2024-03-28 | Disposition: A | Discharge status: Home / Self Care | Source: Ambulatory Visit | Attending: Surgery | Admitting: Surgery

## 2024-03-28 DIAGNOSIS — Z01818 Encounter for other preprocedural examination: Secondary | ICD-10-CM | POA: Insufficient documentation

## 2024-03-28 DIAGNOSIS — C50911 Malignant neoplasm of unspecified site of right female breast: Secondary | ICD-10-CM

## 2024-03-28 HISTORY — PX: BREAST BIOPSY: SHX20

## 2024-03-28 MED ORDER — CHLORHEXIDINE GLUCONATE CLOTH 2 % EX PADS: 6.0000 | MEDICATED_PAD | Freq: Once | CUTANEOUS | Status: DC

## 2024-03-28 NOTE — Progress Notes (Signed)

## 2024-03-30 NOTE — Anesthesia Preprocedure Evaluation (Signed)
"                                    Anesthesia Evaluation  Patient identified by MRN, date of birth, ID band Patient awake    Reviewed: Allergy & Precautions, NPO status , Patient's Chart, lab work & pertinent test results  Airway Mallampati: II  TM Distance: >3 FB Neck ROM: Full    Dental  (+) Teeth Intact, Dental Advisory Given   Pulmonary neg pulmonary ROS   Pulmonary exam normal breath sounds clear to auscultation       Cardiovascular hypertension, Pt. on medications Normal cardiovascular exam Rhythm:Regular Rate:Normal     Neuro/Psych  PSYCHIATRIC DISORDERS Anxiety Depression     Neuromuscular disease    GI/Hepatic ,GERD  Medicated and Controlled,,  Endo/Other  Hypothyroidism  Obesity   Renal/GU      Musculoskeletal  (+) Arthritis ,    Abdominal   Peds  Hematology   Anesthesia Other Findings   Reproductive/Obstetrics                              Anesthesia Physical Anesthesia Plan  ASA: 2  Anesthesia Plan: General   Post-op Pain Management: Tylenol  PO (pre-op)* and Toradol  IV (intra-op)*   Induction: Intravenous  PONV Risk Score and Plan: 4 or greater and Ondansetron , Dexamethasone  and Treatment may vary due to age or medical condition  Airway Management Planned: LMA  Additional Equipment: None  Intra-op Plan:   Post-operative Plan: Extubation in OR  Informed Consent: I have reviewed the patients History and Physical, chart, labs and discussed the procedure including the risks, benefits and alternatives for the proposed anesthesia with the patient or authorized representative who has indicated his/her understanding and acceptance.     Dental advisory given  Plan Discussed with: CRNA  Anesthesia Plan Comments:          Anesthesia Quick Evaluation  "

## 2024-04-01 ENCOUNTER — Ambulatory Visit (HOSPITAL_BASED_OUTPATIENT_CLINIC_OR_DEPARTMENT_OTHER)
Admission: RE | Admit: 2024-04-01 | Discharge: 2024-04-01 | Disposition: A | Discharge status: Home / Self Care | Attending: Surgery | Admitting: Surgery

## 2024-04-01 ENCOUNTER — Ambulatory Visit
Admission: RE | Admit: 2024-04-01 | Discharge: 2024-04-01 | Disposition: A | Source: Ambulatory Visit | Attending: Surgery | Admitting: Surgery

## 2024-04-01 ENCOUNTER — Encounter (HOSPITAL_BASED_OUTPATIENT_CLINIC_OR_DEPARTMENT_OTHER)
Admission: RE | Disposition: A | Discharge status: Home / Self Care | Payer: Self-pay | Source: Home / Self Care | Attending: Surgery

## 2024-04-01 ENCOUNTER — Ambulatory Visit (HOSPITAL_BASED_OUTPATIENT_CLINIC_OR_DEPARTMENT_OTHER): Payer: Self-pay | Admitting: Anesthesiology

## 2024-04-01 ENCOUNTER — Encounter (HOSPITAL_BASED_OUTPATIENT_CLINIC_OR_DEPARTMENT_OTHER): Payer: Self-pay | Admitting: Surgery

## 2024-04-01 ENCOUNTER — Encounter (HOSPITAL_BASED_OUTPATIENT_CLINIC_OR_DEPARTMENT_OTHER): Payer: Self-pay | Admitting: Anesthesiology

## 2024-04-01 DIAGNOSIS — Z6830 Body mass index (BMI) 30.0-30.9, adult: Secondary | ICD-10-CM | POA: Insufficient documentation

## 2024-04-01 DIAGNOSIS — Z1732 Human epidermal growth factor receptor 2 negative status: Secondary | ICD-10-CM | POA: Diagnosis not present

## 2024-04-01 DIAGNOSIS — Z170 Estrogen receptor positive status [ER+]: Secondary | ICD-10-CM | POA: Diagnosis not present

## 2024-04-01 DIAGNOSIS — Z01818 Encounter for other preprocedural examination: Secondary | ICD-10-CM

## 2024-04-01 DIAGNOSIS — N6091 Unspecified benign mammary dysplasia of right breast: Secondary | ICD-10-CM | POA: Insufficient documentation

## 2024-04-01 DIAGNOSIS — C50411 Malignant neoplasm of upper-outer quadrant of right female breast: Secondary | ICD-10-CM | POA: Insufficient documentation

## 2024-04-01 DIAGNOSIS — M1990 Unspecified osteoarthritis, unspecified site: Secondary | ICD-10-CM | POA: Insufficient documentation

## 2024-04-01 DIAGNOSIS — E669 Obesity, unspecified: Secondary | ICD-10-CM | POA: Insufficient documentation

## 2024-04-01 DIAGNOSIS — C50911 Malignant neoplasm of unspecified site of right female breast: Secondary | ICD-10-CM

## 2024-04-01 DIAGNOSIS — N6021 Fibroadenosis of right breast: Secondary | ICD-10-CM | POA: Insufficient documentation

## 2024-04-01 DIAGNOSIS — E039 Hypothyroidism, unspecified: Secondary | ICD-10-CM | POA: Insufficient documentation

## 2024-04-01 DIAGNOSIS — I10 Essential (primary) hypertension: Secondary | ICD-10-CM | POA: Diagnosis not present

## 2024-04-01 DIAGNOSIS — K219 Gastro-esophageal reflux disease without esophagitis: Secondary | ICD-10-CM | POA: Diagnosis not present

## 2024-04-01 DIAGNOSIS — Z1721 Progesterone receptor positive status: Secondary | ICD-10-CM | POA: Diagnosis not present

## 2024-04-01 HISTORY — PX: BREAST LUMPECTOMY WITH RADIOACTIVE SEED LOCALIZATION: SHX6424

## 2024-04-01 MED ORDER — LIDOCAINE HCL (CARDIAC) PF 100 MG/5ML IV SOSY
PREFILLED_SYRINGE | INTRAVENOUS | Status: DC | PRN
  Administered 2024-04-01: 80 mg via INTRAVENOUS

## 2024-04-01 MED ORDER — BUPIVACAINE-EPINEPHRINE (PF) 0.25% -1:200000 IJ SOLN
INTRAMUSCULAR | Status: DC | PRN
  Administered 2024-04-01: 10 mL

## 2024-04-01 MED ORDER — CEFAZOLIN SODIUM-DEXTROSE 2-4 GM/100ML-% IV SOLN
INTRAVENOUS | Status: AC
  Filled 2024-04-01: qty 100

## 2024-04-01 MED ORDER — PROPOFOL 10 MG/ML IV BOLUS
INTRAVENOUS | Status: DC | PRN
  Administered 2024-04-01: 200 mg via INTRAVENOUS

## 2024-04-01 MED ORDER — OXYCODONE HCL 5 MG PO TABS: 5.0000 mg | ORAL_TABLET | Freq: Four times a day (QID) | ORAL | 0 refills | Status: AC | PRN

## 2024-04-01 MED ORDER — FENTANYL CITRATE (PF) 100 MCG/2ML IJ SOLN
INTRAMUSCULAR | Status: DC | PRN
  Administered 2024-04-01: 100 ug via INTRAVENOUS

## 2024-04-01 MED ORDER — DEXAMETHASONE SOD PHOSPHATE PF 10 MG/ML IJ SOLN
INTRAMUSCULAR | Status: DC | PRN
  Administered 2024-04-01: 5 mg via INTRAVENOUS

## 2024-04-01 MED ORDER — DEXAMETHASONE SOD PHOSPHATE PF 10 MG/ML IJ SOLN
INTRAMUSCULAR | Status: AC
  Filled 2024-04-01: qty 1

## 2024-04-01 MED ORDER — CEFAZOLIN SODIUM-DEXTROSE 2-4 GM/100ML-% IV SOLN: 2.0000 g | INTRAVENOUS | Status: DC

## 2024-04-01 MED ORDER — ONDANSETRON HCL 4 MG/2ML IJ SOLN
INTRAMUSCULAR | Status: AC
  Filled 2024-04-01: qty 2

## 2024-04-01 MED ORDER — LACTATED RINGERS IV SOLN: INTRAVENOUS | Status: DC

## 2024-04-01 MED ORDER — FENTANYL CITRATE (PF) 100 MCG/2ML IJ SOLN
INTRAMUSCULAR | Status: AC
  Filled 2024-04-01: qty 2

## 2024-04-01 MED ORDER — KETOROLAC TROMETHAMINE 30 MG/ML IJ SOLN
INTRAMUSCULAR | Status: DC | PRN
  Administered 2024-04-01: 30 mg via INTRAVENOUS

## 2024-04-01 MED ORDER — FENTANYL CITRATE (PF) 100 MCG/2ML IJ SOLN: 25.0000 ug | INTRAMUSCULAR | Status: DC | PRN

## 2024-04-01 MED ORDER — LACTATED RINGERS IV SOLN: INTRAVENOUS | Status: DC | PRN

## 2024-04-01 MED ORDER — ONDANSETRON HCL 4 MG/2ML IJ SOLN
INTRAMUSCULAR | Status: DC | PRN
  Administered 2024-04-01: 4 mg via INTRAVENOUS

## 2024-04-01 MED ORDER — LIDOCAINE 2% (20 MG/ML) 5 ML SYRINGE
INTRAMUSCULAR | Status: AC
  Filled 2024-04-01: qty 5

## 2024-04-01 MED ORDER — PHENYLEPHRINE HCL (PRESSORS) 10 MG/ML IV SOLN
INTRAVENOUS | Status: DC | PRN
  Administered 2024-04-01: 80 ug via INTRAVENOUS
  Administered 2024-04-01: 160 ug via INTRAVENOUS
  Administered 2024-04-01: 80 ug via INTRAVENOUS
  Administered 2024-04-01: 160 ug via INTRAVENOUS
  Administered 2024-04-01: 80 ug via INTRAVENOUS
  Administered 2024-04-01: 160 ug via INTRAVENOUS

## 2024-04-01 MED ORDER — ACETAMINOPHEN 500 MG PO TABS
ORAL_TABLET | ORAL | Status: AC
  Filled 2024-04-01: qty 2

## 2024-04-01 MED ORDER — ACETAMINOPHEN 500 MG PO TABS
1000.0000 mg | ORAL_TABLET | Freq: Once | ORAL | Status: AC
  Administered 2024-04-01: 1000 mg via ORAL

## 2024-04-01 MED ORDER — BUPIVACAINE-EPINEPHRINE (PF) 0.25% -1:200000 IJ SOLN
INTRAMUSCULAR | Status: AC
  Filled 2024-04-01: qty 30

## 2024-04-01 MED ORDER — EPHEDRINE 5 MG/ML INJ
INTRAVENOUS | Status: AC
  Filled 2024-04-01: qty 20

## 2024-04-01 MED ORDER — CEFAZOLIN SODIUM-DEXTROSE 2-3 GM-%(50ML) IV SOLR
INTRAVENOUS | Status: DC | PRN
  Administered 2024-04-01: 2 g via INTRAVENOUS

## 2024-04-01 MED ORDER — PHENYLEPHRINE 80 MCG/ML (10ML) SYRINGE FOR IV PUSH (FOR BLOOD PRESSURE SUPPORT)
PREFILLED_SYRINGE | INTRAVENOUS | Status: AC
  Filled 2024-04-01: qty 60

## 2024-04-01 MED ORDER — AMISULPRIDE (ANTIEMETIC) 5 MG/2ML IV SOLN: 10.0000 mg | Freq: Once | INTRAVENOUS | Status: DC | PRN

## 2024-04-01 MED ORDER — ONDANSETRON HCL 4 MG/2ML IJ SOLN: 4.0000 mg | Freq: Once | INTRAMUSCULAR | Status: DC | PRN

## 2024-04-01 NOTE — Anesthesia Procedure Notes (Signed)
 Procedure Name: LMA Insertion Date/Time: 04/01/2024 2:40 PM  Performed by: Burnard Rosaline HERO, CRNAPre-anesthesia Checklist: Patient identified, Emergency Drugs available, Suction available and Patient being monitored Patient Re-evaluated:Patient Re-evaluated prior to induction Oxygen Delivery Method: Circle system utilized Preoxygenation: Pre-oxygenation with 100% oxygen Induction Type: IV induction Ventilation: Mask ventilation without difficulty LMA: LMA inserted LMA Size: 4.0 Number of attempts: 1 Airway Equipment and Method: Bite block Placement Confirmation: positive ETCO2, CO2 detector and breath sounds checked- equal and bilateral Tube secured with: Tape Dental Injury: Teeth and Oropharynx as per pre-operative assessment

## 2024-04-01 NOTE — H&P (Signed)
 History of Present Illness: [name] is a 74 y.o. female who is seen today as an office consultation for evaluation of New Consultation  Patient seen today for an abnormal right breast mammogram. She is found to have 2 areas in the right upper outer quadrant of the breast. The largest measures 6 mm. Core biopsy proven to be invasive ductal carcinoma ER positive, PR positive, HER2/neu negative, with a KI of 5%. The second area in the right breast same quadrant was ADH. No history of breast cancer. She gets her mammograms yearly. Denies any history of breast pain breast mass or nipple discharge.  Review of Systems: A complete review of systems was obtained from the patient. I have reviewed this information and discussed as appropriate with the patient. See HPI as well for other ROS.    Medical History: Past Medical History:  Diagnosis Date  Arthritis  GERD (gastroesophageal reflux disease)  Hyperlipidemia  Hypertension  Thyroid  disease   There is no problem list on file for this patient.  Past Surgical History:  Procedure Laterality Date  JOINT REPLACEMENT    No Known Allergies  Current Outpatient Medications on File Prior to Visit  Medication Sig Dispense Refill  carboxymethylcellulose (REFRESH TEARS) 0.5 % ophthalmic solution Apply 1 drop to eye  cyclobenzaprine (FLEXERIL) 5 MG tablet Take 5 mg by mouth 3 (three) times daily as needed for Muscle spasms  docusate (COLACE) 100 MG capsule Take 100 mg by mouth once daily  DULoxetine  (CYMBALTA ) 60 MG DR capsule Take 60 mg by mouth once daily  fluticasone  propionate (FLONASE ) 50 mcg/actuation nasal spray Place 2 sprays into both nostrils once daily  gabapentin (NEURONTIN) 100 MG capsule Take 100 mg by mouth 3 (three) times daily  losartan -hydroCHLOROthiazide  (HYZAAR) 50-12.5 mg tablet Take 1 tablet by mouth once daily  multivitamin with minerals tablet Take 1 tablet by mouth every morning  nystatin -triamcinolone  ointment   pantoprazole  (PROTONIX ) 40 MG DR tablet Take 40 mg by mouth once daily  simvastatin  (ZOCOR ) 20 MG tablet  SYNTHROID  88 mcg tablet Take 88 mcg by mouth once daily  traZODone  (DESYREL ) 50 MG tablet   No current facility-administered medications on file prior to visit.   Family History  Problem Relation Age of Onset  High blood pressure (Hypertension) Mother  Hyperlipidemia (Elevated cholesterol) Mother  Heart valve disease Father  High blood pressure (Hypertension) Sister  Hyperlipidemia (Elevated cholesterol) Sister  Diabetes Sister  Colon cancer Sister  Breast cancer Sister    Social History   Tobacco Use  Smoking Status Former  Types: Cigarettes  Smokeless Tobacco Never    Social History   Socioeconomic History  Marital status: Married  Tobacco Use  Smoking status: Former  Types: Cigarettes  Smokeless tobacco: Never  Substance and Sexual Activity  Alcohol use: Never  Drug use: Never   Social Drivers of Corporate Investment Banker Strain: Low Risk (03/05/2023)  Received from [facility]  Overall Financial Resource Strain (CARDIA)  Difficulty of Paying Living Expenses: Not hard at all  Food Insecurity: No Food Insecurity (02/12/2024)  Received from [facility]  Hunger Vital Sign  Within the past 12 months, you worried that your food would run out before you got the money to buy more.: Never true  Within the past 12 months, the food you bought just didn't last and you didn't have money to get more.: Never true  Transportation Needs: No Transportation Needs (02/12/2024)  Received from [facility] - Transportation  In the past 12 months, has lack of transportation kept you from medical appointments or from getting medications?: No  In the past 12 months, has lack of transportation kept you from meetings, work, or from getting things needed for daily living?: No  Physical Activity: Insufficiently Active (02/12/2024)  Received from [facility]  Exercise  Vital Sign  On average, how many days per week do you engage in moderate to strenuous exercise (like a brisk walk)?: 3 days  On average, how many minutes do you engage in exercise at this level?: 30 min  Stress: No Stress Concern Present (02/12/2024)  Received from [facility] of Occupational Health - Occupational Stress Questionnaire  Do you feel stress - tense, restless, nervous, or anxious, or unable to sleep at night because your mind is troubled all the time - these days?: Not at all  Social Connections: Socially Integrated (02/12/2024)  Received from [facility]  Social Connection and Isolation Panel  In a typical week, how many times do you talk on the phone with family, friends, or neighbors?: More than three times a week  How often do you get together with friends or relatives?: Once a week  How often do you attend church or religious services?: More than 4 times per year  Do you belong to any clubs or organizations such as church groups, unions, fraternal or athletic groups, or school groups?: Yes  How often do you attend meetings of the clubs or organizations you belong to?: More than 4 times per year  Are you married, widowed, divorced, separated, never married, or living with a partner?: Married  Housing Stability: Unknown (02/29/2024)  Housing Stability Vital Sign  Homeless in the Last Year: No   Objective:   Vitals:  02/29/24 1024  BP: 135/78  Pulse: 77  Temp: 36.5 C (97.7 F)  SpO2: 94%  Weight: 85.8 kg (189 lb 3.2 oz)  Height: 167.6 cm (5' 6)  PainSc: 0-No pain   Body mass index is 30.54 kg/m.  Physical Exam Exam conducted with a chaperone present.  Cardiovascular:  Rate and Rhythm: Normal rate.  Pulmonary:  Effort: Pulmonary effort is normal.  Chest:  Breasts: Right: Normal. No mass.  Left: Normal. No mass.  Musculoskeletal:  Cervical back: Normal range of motion.  Lymphadenopathy:  Upper Body:  Right upper body: No supraclavicular  or axillary adenopathy.  Left upper body: No supraclavicular or axillary adenopathy.     Labs, Imaging and Diagnostic Testing: FINAL DIAGNOSIS  1. Breast, right, needle core biopsy, UOQ (ribbon clip) : INVASIVE MAMMARY CARCINOMA, SEE NOTE MAMMARY CARCINOMA IN SITU, SOLID, INTERMEDIATE NUCLEAR GRADE, WITH FOCAL NECROSIS TUBULE FORMATION: SCORE 3 NUCLEAR PLEOMORPHISM: SCORE 3 MITOTIC COUNT: SCORE 1 TOTAL SCORE: 7 OVERALL GRADE: 2 LYMPHOVASCULAR INVASION: NOT IDENTIFIED CANCER LENGTH: 0.9 CM CALCIFICATIONS: NOT IDENTIFIED OTHER FINDINGS: FIBROADENOMATOID CHANGE SEE NOTE  Diagnosis Note : Dr. Macie reviewed the case and concurs with the interpretation. E-cadherin and breast prognostic profile (ER, PR, Ki-67 and HER2) is pending and will be reported in an addendum. [facility] was notified on 02/21/2024.  DATE SIGNED OUT: 02/21/2024 ELECTRONIC SIGNATURE : Belvie Come, John, Pathologist, Electronic Signature  MICROSCOPIC DESCRIPTION  CASE COMMENTS STAINS USED IN DIAGNOSIS: H&E-2 H&E-3 H&E-4 H&E H&E-2 H&E-3 H&E-4 H&E H&E-2 H&E-3 H&E-4 H&E H&E-2 H&E-3 H&E-4 H&E *RECUT 1 SLIDE Universal Negative Control-DAB Stains used in diagnosis 1 E-CAD, 1 Her2 by IHC, 1 ER-ACIS, 1 KI-67-ACIS, 1 PR-ACIS Some of these immunohistochemical stains may  have been developed and the performance characteristics determined by [facility]. Some may not have been cleared or approved by the U.S. Food and Drug Administration. The FDA has determined that such clearance or approval is not necessary. This test is used for clinical purposes. It should not be regarded as investigational or for research. This laboratory is certified under the Clinical Laboratory Improvement Amendments of 1988 (CLIA-88) as qualified to perform high complexity clinical laboratory testing. IHC scores are reported using ASCO/CAP scoring criteria. An IHC Score of 0 or 1+ is NEGATIVE for HER2,  3+ is POSITIVE for HER2, and 2+ is EQUIVOCAL. Equivocal results are reflexed to either FISH or IHC testing. Specimens are fixed in 10% Neutral Buffered Formalin for at least 6 hours and up to 72 hours. These tests have not be validated on decalcified tissue. Results should be interpreted with caution given the possibility of false negative results on decalcified specimens. Antibody Clone for HER2 is 4B5 (PATHWAY). Some of these immunohistochemical stains may have been developed and the performance characteristics determined by [facility]. Some may not have been cleared or approved by the U.S. Food and Drug Administration. The FDA has determined that such clearance or approval is not necessary. This test is used for clinical purposes. It should not be regarded as investigational or for research. This laboratory is certified under the Clinical Laboratory Improvement Amendments of 1988 (CLIA-88) as qualified to perform high complexity clinical laboratory testing. Estrogen receptor (6F11), immunohistochemical stains are performed on formalin fixed, paraffin embedded tissue using a 3,3-diaminobenzidine (DAB) chromogen and Leica Bond Autostainer System. The staining intensity of the nucleus is scored manually and is reported as the percentage of tumor cell nuclei demonstrating specific nuclear staining.Specimens are fixed in 10% Neutral Buffered Formalin for at least 6 hours and up to 72 hours. These tests have not be validated on decalcified tissue. Results should be interpreted with caution given the possibility of false negative results on decalcified specimens. Ki-67 (MM1), immunohistochemical stains are performed on formalin fixed, paraffin embedded tissue using a 3,3-diaminobenzidine (DAB) chromogen and Leica Bond Autostainer System. The staining intensity of the nucleus is scored manually and is reported as the percentage of tumor cell nuclei demonstrating specific nuclear  staining.Specimens are fixed in 10% Neutral Buffered Formalin for at least 6 hours and up to 72 hours. These tests have not be validated on decalcified tissue. Results should be interpreted with caution given the possibility of false negative results on decalcified specimens. PR progesterone receptor (16), immunohistochemical stains are performed on formalin fixed, paraffin embedded tissue using a 3,3-diaminobenzidine (DAB) chromogen and Leica Bond Autostainer System. The staining intensity of the nucleus is scored manually and is reported as the percentage of tumor cell nuclei demonstrating specific nuclear staining.Specimens are fixed in 10% Neutral Buffered Formalin for at least 6 hours and up to 72 hours. These tests have not be validated on decalcified tissue. Results should be interpreted with caution given the possibility of false negative results on decalcified specimens.  ADDENDUM 1) Breast, right, needle core biopsy, UOQ ( ribbon clip) PROGNOSTIC INDICATORS  Results: IMMUNOHISTOCHEMICAL AND MORPHOMETRIC ANALYSIS PERFORMED MANUALLY The tumor cells are NEGATIVE for Her2 (0). Estrogen Receptor: 90%, POSITIVE, STRONG STAINING INTENSITY Progesterone Receptor: 40%, POSITIVE, STRONG STAINING INTENSITY Proliferation Marker Ki67: 5% REFERENCE RANGE ESTROGEN RECEPTOR NEGATIVE 0% POSITIVE =>1% REFERENCE RANGE PROGESTERONE RECEPTOR NEGATIVE 0% POSITIVE =>1% All controls stained appropriately Belvie Come, John, Pathologist, Electronic Signature ( Signed 12 12 2025) Immunohistochemistry for E-cadherin is negative  consistent with lobular carcinoma. Belvie Come, John, Pathologist, Electronic Signature (E-cadherin immunohistochemistry Signed [phone])  CLINICAL HISTORY  SPECIMEN(S) OBTAINED 1. Breast, right, needle core biopsy, UOQ (ribbon Clip)  SPECIMEN COMMENTS: 1. TIF: 11:05am, CIT: < 5 min SPECIMEN CLINICAL INFORMATION: 1. 74 yr old woman with indeterminate 6mm  architectural distortion presents for stereotactic guided biopsy  Gross Description 1. Received in formalin labeled [place] and right breast distortion is a pinwheel breast chamber with chambers A-L containing yellow fibroadipose tissue (3.8 x 3.2 x 0.3 cm in aggregate). The specimen is entirely submitted in four block. TIF 11:05 a.m. CIT less than 5 mins. (BP:gt, 02/21/24)  Report signed out from the following location(s) [place] [address] [place], [place] [address] CLIA #: [id number]  Breast, right, needle core biopsy, upper outer quadrant (coil clip) :  - FOCAL ATYPICAL DUCTAL HYPERPLASIA WITH ASSOCIATED CALCIFICATIONS.  NOTE: DR. SHARMA HAS PEER REVIEWED THE CASE AND AGREES WITH THE INTERPRETATION.   CLINICAL DATA: 74 year old female recalled for asymmetry and calcifications in the right breast on recent screening mammogram  EXAM: DIGITAL DIAGNOSTIC UNILATERAL RIGHT MAMMOGRAM WITH TOMOSYNTHESIS AND CAD; ULTRASOUND RIGHT BREAST LIMITED  TECHNIQUE: Right digital diagnostic mammography and breast tomosynthesis was performed. The images were evaluated with computer-aided detection. ; Targeted ultrasound examination of the right breast was performed  COMPARISON: Previous exam(s).  ACR Breast Density Category c: The breasts are heterogeneously dense, which may obscure small masses.  FINDINGS: CC and MLO spot magnification views of small 4 mm group of calcifications in the upper outer right breast middle depth demonstrate 3 linear calcifications favored to represent early secretory or potentially early vascular calcifications which are probably benign.  On focal spot compression magnification views there is a persistent 6 mm fat centered area of architectural distortion in the upper-outer right breast middle to posterior depth 6-7 cm from the nipple. An ultrasound is recommended for further evaluation.  Sonography of the upper and lateral  right breast was performed by the sonographer and myself. There are several benign intramammary nodes. However, no definite correlate for the distortion seen in the upper outer right breast.  IMPRESSION: Indeterminate 6 mm area of architectural distortion in the upper outer right breast middle to posterior depth.  4 mm group of probably benign early secretory or early vascular calcifications in the upper outer right breast middle depth.  RECOMMENDATION: Stereotactic guided biopsy of the architectural distortion in the upper-outer right breast is recommended.  If the above biopsy comes back as benign then six-month follow-up of the probably benign calcifications in the upper outer right breast middle depth is recommended. If the biopsy comes back as malignancy then consideration for stereotactic biopsy of the probably benign calcifications is recommended.  I have discussed the findings and recommendations with the patient. If applicable, a reminder letter will be sent to the patient regarding the next appointment.  BI-RADS CATEGORY 4: Suspicious.   Electronically Signed By: Rosina Gelineau M.D. On: 02/11/2024 11:21  DIGITAL SCREENING BILATERAL MAMMOGRAM WITH TOMOSYNTHESIS AND CAD  TECHNIQUE: Bilateral screening digital craniocaudal and mediolateral oblique mammograms were obtained. Bilateral screening digital breast tomosynthesis was performed. The images were evaluated with computer-aided detection.  COMPARISON: Previous exam(s).  ACR Breast Density Category c: The breasts are heterogeneously dense, which may obscure small masses.  FINDINGS: In the right breast, a possible asymmetry and separate calcifications warrant further evaluation. In the left breast, no findings suspicious for malignancy.  IMPRESSION: Further evaluation is suggested for possible  asymmetry and separate calcifications in the right breast.  RECOMMENDATION: Diagnostic mammogram and possibly  ultrasound of the right breast. (Code:FI-R-65M)  The patient will be contacted regarding the findings, and additional imaging will be scheduled.  BI-RADS CATEGORY 0: Incomplete: Need additional imaging evaluation.   Electronically Signed By: Corean Salter M.D. On: 01/28/2024 08:36  Assessment and Plan:   Diagnoses and all orders for this visit:  Breast cancer, stage 1, right (CMS/HHS-HCC)   Refer to medical and radiation oncology  Discussed breast conserving surgery versus mastectomy with reconstruction. Discussed the role of sentinel lymph node mapping especially of the age of 28 with low-grade tumors  Discussed potential complications of lymphedema and shoulder stiffness  She wishes to proceed with right breast seed lumpectomy alone. Risks and benefits of surgery reviewed as well as the omission of sentinel node mapping. Discussed data relaying potential rates of lymph node disease in this circumstance being under 8%. In most cases, this will not change her overall treatment plan.The procedure has been discussed with the patient. Alternatives to surgery have been discussed with the patient. Risks of surgery include bleeding, Infection, Seroma formation, death, and the need for further surgery. The patient understands and wishes to proceed.    DEBBY CURTISTINE SHIPPER, MD

## 2024-04-01 NOTE — Discharge Instructions (Addendum)
 [facility] Office Phone Number [phone]  BREAST BIOPSY/ PARTIAL MASTECTOMY: POST OP INSTRUCTIONS  Always review your discharge instruction sheet given to you by the facility where your surgery was performed.  IF YOU HAVE DISABILITY OR FAMILY LEAVE FORMS, YOU MUST BRING THEM TO THE OFFICE FOR PROCESSING.  DO NOT GIVE THEM TO YOUR DOCTOR.  A prescription for pain medication may be given to you upon discharge.  Take your pain medication as prescribed, if needed.  If narcotic pain medicine is not needed, then you may take acetaminophen  (Tylenol ) or ibuprofen (Advil) as needed. Take your usually prescribed medications unless otherwise directed If you need a refill on your pain medication, please contact your pharmacy.  They will contact our office to request authorization.  Prescriptions will not be filled after 5pm or on week-ends. You should eat very light the first 24 hours after surgery, such as soup, crackers, pudding, etc.  Resume your normal diet the day after surgery. Most patients will experience some swelling and bruising in the breast.  Ice packs and a good support bra will help.  Swelling and bruising can take several days to resolve.  It is common to experience some constipation if taking pain medication after surgery.  Increasing fluid intake and taking a stool softener will usually help or prevent this problem from occurring.  A mild laxative (Milk of Magnesia or Miralax ) should be taken according to package directions if there are no bowel movements after 48 hours. Unless discharge instructions indicate otherwise, you may remove your bandages 24-48 hours after surgery, and you may shower at that time.  You may have steri-strips (small skin tapes) in place directly over the incision.  These strips should be left on the skin for 7-10 days.  If your surgeon used skin glue on the incision, you may shower in 24 hours.  The glue will flake off over the next 2-3 weeks.  Any  sutures or staples will be removed at the office during your follow-up visit. ACTIVITIES:  You may resume regular daily activities (gradually increasing) beginning the next day.  Wearing a good support bra or sports bra minimizes pain and swelling.  You may have sexual intercourse when it is comfortable. You may drive when you no longer are taking prescription pain medication, you can comfortably wear a seatbelt, and you can safely maneuver your car and apply brakes. RETURN TO WORK:  ______________________________________________________________________________________ Rosine should see your doctor in the office for a follow-up appointment approximately two weeks after your surgery.  Your doctors nurse will typically make your follow-up appointment when she calls you with your pathology report.  Expect your pathology report 2-3 business days after your surgery.  You may call to check if you do not hear from us  after three days. OTHER INSTRUCTIONS: _______________________________________________________________________________________________ _____________________________________________________________________________________________________________________________________ _____________________________________________________________________________________________________________________________________ _____________________________________________________________________________________________________________________________________  WHEN TO CALL YOUR DOCTOR: Fever over 101.0 Nausea and/or vomiting. Extreme swelling or bruising. Continued bleeding from incision. Increased pain, redness, or drainage from the incision.  The clinic staff is available to answer your questions during regular business hours.  Please dont hesitate to call and ask to speak to one of the nurses for clinical concerns.  If you have a medical emergency, go to the nearest emergency room or call 911.  A surgeon from [facility] is always on call at the hospital.  For further questions, please visit [email or web address]    ** No Tylenol  until after 8:00 pm  Post Anesthesia Home Care Instructions  Activity:  Get plenty of rest for the remainder of the day. A responsible individual must stay with you for 24 hours following the procedure.  For the next 24 hours, DO NOT: -Drive a car -Advertising copywriter -Drink alcoholic beverages -Take any medication unless instructed by your physician -Make any legal decisions or sign important papers.  Meals: Start with liquid foods such as gelatin or soup. Progress to regular foods as tolerated. Avoid greasy, spicy, heavy foods. If nausea and/or vomiting occur, drink only clear liquids until the nausea and/or vomiting subsides. Call your physician if vomiting continues.  Special Instructions/Symptoms: Your throat may feel dry or sore from the anesthesia or the breathing tube placed in your throat during surgery. If this causes discomfort, gargle with warm salt water . The discomfort should disappear within 24 hours.  If you had a scopolamine patch placed behind your ear for the management of post- operative nausea and/or vomiting:  1. The medication in the patch is effective for 72 hours, after which it should be removed.  Wrap patch in a tissue and discard in the trash. Wash hands thoroughly with soap and water . 2. You may remove the patch earlier than 72 hours if you experience unpleasant side effects which may include dry mouth, dizziness or visual disturbances. 3. Avoid touching the patch. Wash your hands with soap and water  after contact with the patch.

## 2024-04-01 NOTE — Interval H&P Note (Signed)
 History and Physical Interval Note:  04/01/2024 1:53 PM  [name]  has presented today for surgery, with the diagnosis of RIGHT BREAST CANCER.  The various methods of treatment have been discussed with the patient and family. After consideration of risks, benefits and other options for treatment, the patient has consented to  Procedures: BREAST LUMPECTOMY WITH RADIOACTIVE SEED LOCALIZATION (Right) as a surgical intervention.  The patient's history has been reviewed, patient examined, no change in status, stable for surgery.  I have reviewed the patient's chart and labs.  Questions were answered to the patient's satisfaction.    The procedure has been discussed with the patient. Alternatives to surgery have been discussed with the patient.  Risks of surgery include bleeding,  Infection,  Seroma formation, death,  and the need for further surgery.   The patient understands and wishes to proceed.  Debby DELENA Shipper MD

## 2024-04-01 NOTE — Op Note (Signed)
 Pre-Op diagnosis: Right breast cancer stage I upper outer quadrant ER positive, atypical ductal hyperplasia right breast upper outer quadrant  Postoperative diagnosis: Same  Procedure: Right breast seed localized lumpectomy x 2  Surgeon: Debby Shipper, MD  Anesthesia: LMA with 0.25% Marcaine  with epinephrine   EBL: Minimal  Specimen: Right breast tissue with 2 seeds and 2 clips verified by [employer] imaging and oriented with ink.  Of note both lesions were in the right upper outer quadrant.  Drains: None  Indications for procedure: The patient is a 74 year old female with 2 mammographic abnormalities right breast upper outer quadrant.  1 was invasive ductal carcinoma ER positive while the other was atypical ductal hyperplasia.  They are both in the same quadrant therefore recommended a wide lumpectomy to cover both areas.  She was to proceed with breast conserving surgery after reviewing all of her operative options of treatment options.The procedure has been discussed with the patient. Alternatives to surgery have been discussed with the patient.  Risks of surgery include bleeding,  Infection,  Seroma formation, death,  and the need for further surgery.   The patient understands and wishes to proceed.     Description of procedure: The patient was met in the holding area questions were answered.  The right breast was then prepped and draped in a sterile fashion after induction of general anesthesia.  A timeout was performed.  The seeds were localized in the right breast upper outer quadrant.  Local anesthetic was infiltrated over both signals.  A single curvilinear incision was made for both signals.  Dissection was carried down all tissue around both seeds and both clips were excised with grossly negative margins.  The cavities made hemostatic with cautery after irrigation.  Clips were placed.  Imaging revealed both seeds and both clips and the tissue was oriented with ink.  It was passed off the  field.  After assuring hemostasis, the lumpectomy was closed with a deep layer 3-0 Vicryl.  4 Monocryl used to close the skin.  Dermabond applied.  Breast binder placed.  All counts found to be correct.  The patient was awoke extubated taken to recovery in satisfactory condition.

## 2024-04-01 NOTE — Transfer of Care (Signed)
 Immediate Anesthesia Transfer of Care Note  Patient: [name] [facility]  Procedure(s) Performed: BREAST LUMPECTOMY WITH RADIOACTIVE SEED LOCALIZATION (Right: Breast)  Patient Location: PACU  Anesthesia Type:General  Level of Consciousness: drowsy  Airway & Oxygen Therapy: Patient Spontanous Breathing and Patient connected to face mask oxygen  Post-op Assessment: Report given to RN and Post -op Vital signs reviewed and stable  Post vital signs: Reviewed and stable  Last Vitals:  Vitals Value Taken Time  BP 135/72 04/01/24 15:15  Temp    Pulse 62 04/01/24 15:15  Resp 10 04/01/24 15:15  SpO2 98 % 04/01/24 15:15  Vitals shown include unfiled device data.  Last Pain:  Vitals:   04/01/24 1357  TempSrc: Temporal  PainSc: 0-No pain         Complications: No notable events documented.

## 2024-04-01 NOTE — Anesthesia Postprocedure Evaluation (Signed)
"   Anesthesia Post Note  Patient: [name] [facility]  Procedure(s) Performed: BREAST LUMPECTOMY WITH RADIOACTIVE SEED LOCALIZATION (Right: Breast)     Patient location during evaluation: PACU Anesthesia Type: General Level of consciousness: awake and alert Pain management: pain level controlled Vital Signs Assessment: post-procedure vital signs reviewed and stable Respiratory status: spontaneous breathing, nonlabored ventilation and respiratory function stable Cardiovascular status: blood pressure returned to baseline and stable Postop Assessment: no apparent nausea or vomiting Anesthetic complications: no   No notable events documented.  Last Vitals:  Vitals:   04/01/24 1545 04/01/24 1600  BP: 116/64 (!) 147/79  Pulse: 66   Resp: 12 16  Temp:  (!) 36.2 C  SpO2: 95% 96%    Last Pain:  Vitals:   04/01/24 1600  TempSrc:   PainSc: 0-No pain                 Garnette FORBES Skillern      "

## 2024-04-02 ENCOUNTER — Encounter (HOSPITAL_BASED_OUTPATIENT_CLINIC_OR_DEPARTMENT_OTHER): Payer: Self-pay | Admitting: Surgery

## 2024-04-03 ENCOUNTER — Ambulatory Visit: Payer: Self-pay | Admitting: Surgery

## 2024-04-03 LAB — SURGICAL PATHOLOGY

## 2024-04-08 ENCOUNTER — Telehealth: Payer: Self-pay | Admitting: *Deleted

## 2024-04-08 ENCOUNTER — Encounter: Payer: Self-pay | Admitting: *Deleted

## 2024-04-08 NOTE — Telephone Encounter (Signed)
 Dr. Odean ordered oncotype to be sent for this patient. Requisition sent to [facility] [employer]

## 2024-04-16 ENCOUNTER — Encounter: Payer: Self-pay | Admitting: *Deleted

## 2024-04-16 ENCOUNTER — Other Ambulatory Visit: Payer: Self-pay | Admitting: Internal Medicine

## 2024-04-16 ENCOUNTER — Encounter (HOSPITAL_COMMUNITY): Payer: Self-pay

## 2024-04-16 DIAGNOSIS — C50411 Malignant neoplasm of upper-outer quadrant of right female breast: Secondary | ICD-10-CM

## 2024-04-21 ENCOUNTER — Inpatient Hospital Stay: Attending: Hematology and Oncology | Admitting: Hematology and Oncology

## 2024-05-01 ENCOUNTER — Ambulatory Visit: Admitting: Radiation Oncology

## 2024-05-01 ENCOUNTER — Ambulatory Visit

## 2024-05-06 ENCOUNTER — Ambulatory Visit: Admitting: Radiation Oncology

## 2024-05-08 ENCOUNTER — Inpatient Hospital Stay: Admitting: Genetic Counselor

## 2024-09-16 ENCOUNTER — Ambulatory Visit: Admitting: Internal Medicine
